# Patient Record
Sex: Male | Born: 1945 | Race: White | Hispanic: No | Marital: Married | Smoking: Never smoker
Health system: Southern US, Community
[De-identification: ages and names within clinical notes are randomized; demographics above are authoritative.]

## PROBLEM LIST (undated history)

## (undated) DIAGNOSIS — R0602 Shortness of breath: Secondary | ICD-10-CM

## (undated) DIAGNOSIS — K635 Polyp of colon: Secondary | ICD-10-CM

## (undated) DIAGNOSIS — G2581 Restless legs syndrome: Secondary | ICD-10-CM

## (undated) DIAGNOSIS — R569 Unspecified convulsions: Secondary | ICD-10-CM

## (undated) HISTORY — DX: Polyp of colon: K63.5

## (undated) HISTORY — DX: Restless legs syndrome: G25.81

## (undated) HISTORY — PX: POLYPECTOMY: SHX149

## (undated) HISTORY — DX: Unspecified convulsions: R56.9

## (undated) HISTORY — PX: HERNIA REPAIR: SHX51

## (undated) HISTORY — DX: Shortness of breath: R06.02

## (undated) HISTORY — PX: COLONOSCOPY: SHX174

---

## 2002-10-15 LAB — HM COLONOSCOPY

## 2002-10-19 ENCOUNTER — Ambulatory Visit (HOSPITAL_COMMUNITY): Admission: RE | Admit: 2002-10-19 | Discharge: 2002-10-19 | Payer: Self-pay | Admitting: Internal Medicine

## 2008-06-21 ENCOUNTER — Ambulatory Visit: Payer: Self-pay | Admitting: Vascular Surgery

## 2009-01-24 ENCOUNTER — Ambulatory Visit (HOSPITAL_COMMUNITY): Admission: RE | Admit: 2009-01-24 | Discharge: 2009-01-24 | Payer: Self-pay | Admitting: Surgery

## 2009-01-24 ENCOUNTER — Encounter: Admission: RE | Admit: 2009-01-24 | Discharge: 2009-01-24 | Payer: Self-pay | Admitting: Surgery

## 2009-01-24 ENCOUNTER — Encounter: Payer: Self-pay | Admitting: Family Medicine

## 2009-01-26 ENCOUNTER — Encounter: Payer: Self-pay | Admitting: Family Medicine

## 2009-01-27 ENCOUNTER — Encounter: Payer: Self-pay | Admitting: Family Medicine

## 2009-02-03 ENCOUNTER — Encounter: Payer: Self-pay | Admitting: Family Medicine

## 2009-02-09 ENCOUNTER — Ambulatory Visit: Payer: Self-pay | Admitting: Family Medicine

## 2009-02-09 DIAGNOSIS — Z87898 Personal history of other specified conditions: Secondary | ICD-10-CM

## 2009-02-09 DIAGNOSIS — R569 Unspecified convulsions: Secondary | ICD-10-CM

## 2009-02-09 DIAGNOSIS — R0602 Shortness of breath: Secondary | ICD-10-CM

## 2009-02-09 HISTORY — DX: Unspecified convulsions: R56.9

## 2009-02-09 HISTORY — DX: Shortness of breath: R06.02

## 2009-02-11 ENCOUNTER — Ambulatory Visit (HOSPITAL_COMMUNITY): Admission: RE | Admit: 2009-02-11 | Discharge: 2009-02-11 | Payer: Self-pay | Admitting: Surgery

## 2009-02-11 HISTORY — PX: HERNIA REPAIR: SHX51

## 2009-02-21 ENCOUNTER — Ambulatory Visit: Payer: Self-pay | Admitting: Family Medicine

## 2009-02-21 LAB — CONVERTED CEMR LAB
ALT: 19 units/L (ref 0–53)
AST: 21 units/L (ref 0–37)
Albumin: 4 g/dL (ref 3.5–5.2)
Alkaline Phosphatase: 58 units/L (ref 39–117)
BUN: 8 mg/dL (ref 6–23)
Basophils Absolute: 0 10*3/uL (ref 0.0–0.1)
Basophils Relative: 0.9 % (ref 0.0–3.0)
Bilirubin Urine: NEGATIVE
Bilirubin, Direct: 0.1 mg/dL (ref 0.0–0.3)
CO2: 34 meq/L — ABNORMAL HIGH (ref 19–32)
Calcium: 9 mg/dL (ref 8.4–10.5)
Chloride: 104 meq/L (ref 96–112)
Cholesterol: 141 mg/dL (ref 0–200)
Creatinine, Ser: 0.7 mg/dL (ref 0.4–1.5)
Eosinophils Absolute: 0.1 10*3/uL (ref 0.0–0.7)
Eosinophils Relative: 1.8 % (ref 0.0–5.0)
GFR calc non Af Amer: 121.44 mL/min (ref >60)
Glucose, Bld: 96 mg/dL (ref 70–99)
HCT: 40.9 % (ref 39.0–52.0)
HDL: 70.9 mg/dL (ref >39.00)
Hemoglobin: 14.3 g/dL (ref 13.0–17.0)
Ketones, urine, test strip: NEGATIVE
LDL Cholesterol: 65 mg/dL (ref 0–99)
Lymphocytes Relative: 29.8 % (ref 12.0–46.0)
Lymphs Abs: 1.2 10*3/uL (ref 0.7–4.0)
MCHC: 34.8 g/dL (ref 30.0–36.0)
MCV: 97.5 fL (ref 78.0–100.0)
Monocytes Absolute: 0.5 10*3/uL (ref 0.1–1.0)
Monocytes Relative: 12.9 % — ABNORMAL HIGH (ref 3.0–12.0)
Neutro Abs: 2.1 10*3/uL (ref 1.4–7.7)
Neutrophils Relative %: 54.6 % (ref 43.0–77.0)
PSA: 0.45 ng/mL (ref 0.10–4.00)
Platelets: 154 10*3/uL (ref 150.0–400.0)
Potassium: 4.8 meq/L (ref 3.5–5.1)
Protein, U semiquant: NEGATIVE
RBC: 4.19 M/uL — ABNORMAL LOW (ref 4.22–5.81)
RDW: 12.6 % (ref 11.5–14.6)
Sodium: 143 meq/L (ref 135–145)
Specific Gravity, Urine: 1.015
TSH: 2.24 microintl units/mL (ref 0.35–5.50)
Total Bilirubin: 0.7 mg/dL (ref 0.3–1.2)
Total CHOL/HDL Ratio: 2
Total Protein: 6.3 g/dL (ref 6.0–8.3)
Triglycerides: 28 mg/dL (ref 0.0–149.0)
VLDL: 5.6 mg/dL (ref 0.0–40.0)
WBC: 3.9 10*3/uL — ABNORMAL LOW (ref 4.5–10.5)

## 2009-02-25 ENCOUNTER — Ambulatory Visit: Payer: Self-pay | Admitting: Family Medicine

## 2009-02-28 DIAGNOSIS — G2581 Restless legs syndrome: Secondary | ICD-10-CM | POA: Insufficient documentation

## 2009-02-28 HISTORY — DX: Restless legs syndrome: G25.81

## 2009-03-07 ENCOUNTER — Ambulatory Visit: Payer: Self-pay | Admitting: Family Medicine

## 2009-03-07 ENCOUNTER — Telehealth: Payer: Self-pay | Admitting: Family Medicine

## 2009-03-07 LAB — CONVERTED CEMR LAB
OCCULT 1: NEGATIVE
OCCULT 2: NEGATIVE
OCCULT 3: NEGATIVE

## 2009-03-10 ENCOUNTER — Telehealth: Payer: Self-pay | Admitting: Family Medicine

## 2009-03-23 ENCOUNTER — Telehealth: Payer: Self-pay | Admitting: Family Medicine

## 2009-08-17 ENCOUNTER — Telehealth: Payer: Self-pay | Admitting: Family Medicine

## 2009-11-16 ENCOUNTER — Ambulatory Visit: Payer: Self-pay | Admitting: Family Medicine

## 2009-12-21 ENCOUNTER — Ambulatory Visit: Payer: Self-pay | Admitting: Family Medicine

## 2009-12-21 DIAGNOSIS — N644 Mastodynia: Secondary | ICD-10-CM

## 2009-12-26 ENCOUNTER — Encounter: Admission: RE | Admit: 2009-12-26 | Discharge: 2009-12-26 | Payer: Self-pay | Admitting: Family Medicine

## 2009-12-26 LAB — HM MAMMOGRAPHY: HM Mammogram: NEGATIVE

## 2010-01-23 ENCOUNTER — Ambulatory Visit: Payer: Self-pay | Admitting: Family Medicine

## 2010-01-23 LAB — CONVERTED CEMR LAB
ALT: 18 units/L (ref 0–53)
Basophils Absolute: 0 10*3/uL (ref 0.0–0.1)
Bilirubin Urine: NEGATIVE
Blood in Urine, dipstick: NEGATIVE
Calcium: 9.5 mg/dL (ref 8.4–10.5)
Cholesterol: 151 mg/dL (ref 0–200)
Creatinine, Ser: 0.6 mg/dL (ref 0.4–1.5)
Eosinophils Absolute: 0 10*3/uL (ref 0.0–0.7)
Eosinophils Relative: 1.6 % (ref 0.0–5.0)
GFR calc non Af Amer: 134.27 mL/min (ref >60)
HDL: 75.2 mg/dL (ref >39.00)
Hemoglobin: 14.5 g/dL (ref 13.0–17.0)
Lymphocytes Relative: 34.7 % (ref 12.0–46.0)
MCHC: 34.5 g/dL (ref 30.0–36.0)
Neutro Abs: 1.5 10*3/uL (ref 1.4–7.7)
Neutrophils Relative %: 48.2 % (ref 43.0–77.0)
Nitrite: NEGATIVE
Platelets: 179 10*3/uL (ref 150.0–400.0)
Potassium: 5 meq/L (ref 3.5–5.1)
RBC: 4.43 M/uL (ref 4.22–5.81)
RDW: 13.8 % (ref 11.5–14.6)
Total Bilirubin: 0.9 mg/dL (ref 0.3–1.2)
Total CHOL/HDL Ratio: 2
Total Protein: 6.5 g/dL (ref 6.0–8.3)
Urobilinogen, UA: 0.2

## 2010-01-31 ENCOUNTER — Ambulatory Visit: Payer: Self-pay | Admitting: Family Medicine

## 2010-02-21 ENCOUNTER — Telehealth: Payer: Self-pay | Admitting: Family Medicine

## 2010-02-23 ENCOUNTER — Telehealth: Payer: Self-pay | Admitting: *Deleted

## 2010-05-05 ENCOUNTER — Ambulatory Visit: Payer: Self-pay | Admitting: Family Medicine

## 2010-08-15 NOTE — Progress Notes (Signed)
Summary: Phenobarbital to [facility]  Phone Note Call from Patient   Caller: Patient Call For: Evelena Peat MD Summary of Call: PB needs to go to [facility], not [facility] per pt. [phone] Initial call taken by: Lynann Beaver CMA,  February 23, 2010 2:59 PM  Follow-up for Phone Call        Rx cancelled from [facility] and faxed to [facility]. Follow-up by: Romualdo Bolk, CMA Duncan Dull),  February 23, 2010 4:54 PM    Prescriptions: PHENOBARBITAL 64.8 MG TABS (PHENOBARBITAL) once daily at bedtime  #90 x 1   Entered by:   Romualdo Bolk, CMA (AAMA)   Authorized by:   Evelena Peat MD   Signed by:   Romualdo Bolk, CMA (AAMA) on 02/23/2010   Method used:   Print then Give to Patient   RxID:   [id number]

## 2010-08-15 NOTE — Progress Notes (Signed)
Summary: [facility] RX request Phenobarbital X 6 months  Phone Note Call from Patient Call back at Home Phone [phone]   Caller: Mom Call For: Evelena Peat MD Summary of Call: pt needs phenobarbital 64.8 mg fax to [facility] 6 month supply pt ID [id number] Initial call taken by: Heron Sabins,  August 17, 2009 12:40 PM  Follow-up for Phone Call        [facility] to refill for 6 months. Follow-up by: Evelena Peat MD,  August 17, 2009 5:53 PM  Additional Follow-up for Phone Call Additional follow up Details #1::        Rx called into [facility] for 6 months, pt informed on home phone provided Additional Follow-up by: Sid Falcon LPN,  August 18, 2009 8:37 AM    Prescriptions: PHENOBARBITAL 64.8 MG TABS (PHENOBARBITAL) once daily at bedtime  #90 x 1   Entered by:   Sid Falcon LPN   Authorized by:   Evelena Peat MD   Signed by:   Sid Falcon LPN on 62/95/2841   Method used:   Telephoned to ...       [facility]* (mail-order)             ,          Ph: [phone]       Fax: [phone]   RxID:   [id number]

## 2010-08-15 NOTE — Letter (Signed)
Summary: Medical History Form  Medical History Form   Imported By: Maryln Gottron 02/01/2010 14:08:04  _____________________________________________________________________  External Attachment:    Type:   Image     Comment:   External Document

## 2010-08-15 NOTE — Assessment & Plan Note (Signed)
Summary: FLU SHOT//CCM  Nurse Visit   Allergies: 1)  Dilantin (Phenytoin Sodium Extended) 2)  Tegretol (Carbamazepine) 3)  Flagyl (Metronidazole) 4)  Penicillin V Potassium (Penicillin V Potassium)  Orders Added: 1)  Admin 1st Vaccine [90471] 2)  Flu Vaccine 9yrs + [16109]         Flu Vaccine Consent Questions     Do you have a history of severe allergic reactions to this vaccine? no    Any prior history of allergic reactions to egg and/or gelatin? no    Do you have a sensitivity to the preservative Thimersol? no    Do you have a past history of Guillan-Barre Syndrome? no    Do you currently have an acute febrile illness? no    Have you ever had a severe reaction to latex? no    Vaccine information given and explained to patient? yes    Are you currently pregnant? no    Lot Number:[id number]   Exp Date:01/13/2011   Site Given  Left Deltoid IM

## 2010-08-15 NOTE — Progress Notes (Signed)
Summary: refill Phenobarbital X 1 year  Phone Note Refill Request Call back at Home Phone [phone] Message from:  Patient--live call  Refills Requested: Medication #1:  PHENOBARBITAL 64.8 MG TABS once daily at bedtime. fax to [facility].......  Initial call taken by: Warnell Forester,  February 21, 2010 10:57 AM    Prescriptions: PHENOBARBITAL 64.8 MG TABS (PHENOBARBITAL) once daily at bedtime  #90 x 3   Entered by:   Sid Falcon LPN   Authorized by:   Evelena Peat MD   Signed by:   Sid Falcon LPN on 09/81/1914   Method used:   [facility] ...       [facility]  [address] [phone][facility] (retail)       [address]       [place], [place]  [address]       Ph: [phone] or [phone]       Fax: [phone]   RxID:   [id number]

## 2010-08-15 NOTE — Assessment & Plan Note (Signed)
Summary: requesting tests//ccm   Vital Signs:  Patient profile:   65 year old male Weight:      151 pounds Temp:     98.5 degrees F oral BP sitting:   110 / 70  (left arm) Cuff size:   regular  Vitals Entered By: Kathrynn Speed CMA (December 21, 2009 3:57 PM) CC: Soreness in Left breast for 6 weeks   History of Present Illness: persistent soreness L breast.  No discharge. Refer to previous note.  He has not noted any masses. No hx of injury.  Pain is mild severity.    No rash, nipple discharge, headache, appetite or weight changes.  Takes Phenobarbital for seizure disorder.  Current Medications (verified): 1)  Phenobarbital 64.8 Mg Tabs (Phenobarbital) .... 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    Physical Exam  General:  Well-developed,well-nourished,in no acute distress; alert,appropriate and cooperative throughout examination Neck:  No deformities, masses, or tenderness noted. Breasts:  L breast slight prominence (gynecomastia) c/w R but no distinct masses.  No nipple discharge or nipple inversion.  Minimaly tender.  No axillary masses. Lungs:  Normal respiratory effort, chest expands symmetrically. Lungs are  clear to auscultation, no crackles or wheezes. Heart:  Normal rate and regular rhythm. S1 and S2 normal without gallop, murmur, click, rub or other extra sounds.   Impression & Recommendations:  Problem # 1:  BREAST PAIN, LEFT (ICD-611.71) Suspect some asymmetric gynecomastia.  No distinct masses palpated.  Can see gynecomastia with phenobarbital.  Will  obtain mammogram.  No evidence for Paget's. Orders: Radiology Referral (Radiology)  Complete Medication List: 1)  Phenobarbital 64.8 Mg Tabs (Phenobarbital) .... Once daily at bedtime  Patient Instructions: 1)  We will call you with appt for mammogram.

## 2010-08-15 NOTE — Assessment & Plan Note (Signed)
Summary: cpx/njr   Vital Signs:  Patient profile:   65 year old male Height:      71.50 inches Weight:      149 pounds BMI:     20.57 Temp:     98 degrees F oral Pulse rate:   60 / minute Pulse rhythm:   regular Resp:     12 per minute BP sitting:   108 / 68  (left arm) Cuff size:   regular  Vitals Entered By: Sid Falcon LPN (January 31, 2010 9:07 AM) CC: CPX, labs done   History of Present Illness: Here for CPE.  Recent breast pain improved.   PMH, SH, AND FH  reviewed.  Clinical Review Panels:  Prevention   Last Mammogram:  BI-RADS CATEGORY 1:  Negative.^MM DIGITAL DIAGNOSTIC UNILAT L (12/26/2009)   Last Colonoscopy:  Done (10/15/2002)   Last PSA:  0.42 (01/23/2010)  Immunizations   Last Tetanus Booster:  Historical (01/28/2007)  Lipid Management   Cholesterol:  151 (01/23/2010)   LDL (bad choesterol):  72 (01/23/2010)   HDL (good cholesterol):  75.20 (01/23/2010)  CBC   WBC:  3.1 (01/23/2010)   RBC:  4.43 (01/23/2010)   Hgb:  14.5 (01/23/2010)   Hct:  42.0 (01/23/2010)   Platelets:  179.0 (01/23/2010)   MCV  94.9 (01/23/2010)   MCHC  34.5 (01/23/2010)   RDW  13.8 (01/23/2010)   PMN:  48.2 (01/23/2010)   Lymphs:  34.7 (01/23/2010)   Monos:  14.4 (01/23/2010)   Eosinophils:  1.6 (01/23/2010)   Basophil:  1.1 (01/23/2010)  Complete Metabolic Panel   Glucose:  100 (01/23/2010)   Sodium:  141 (01/23/2010)   Potassium:  5.0 (01/23/2010)   Chloride:  104 (01/23/2010)   CO2:  36 (01/23/2010)   BUN:  12 (01/23/2010)   Creatinine:  0.6 (01/23/2010)   Albumin:  4.5 (01/23/2010)   Total Protein:  6.5 (01/23/2010)   Calcium:  9.5 (01/23/2010)   Total Bili:  0.9 (01/23/2010)   Alk Phos:  67 (01/23/2010)   SGPT (ALT):  18 (01/23/2010)   SGOT (AST):  23 (01/23/2010)   Allergies: 1)  Dilantin (Phenytoin Sodium Extended) 2)  Tegretol (Carbamazepine) 3)  Flagyl (Metronidazole) 4)  Penicillin V Potassium (Penicillin V Potassium)  Past History:  Past  Medical History: Last updated: 02/09/2009 Seizure disorder  Past Surgical History: Last updated: 02/25/2009 Inguinal herniorrhaphy  Family History: Last updated: 01/31/2010 Family History of Multiple Myeloma, father Family History Ovarian cancer, mother Family History of Stroke grandparent  Social History: Last updated: 01/31/2010 Occupation:  Engineer, 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      Physical Exam  General:  Well-developed,well-nourished,in no acute distress; alert,appropriate and cooperative throughout examination Head:  Normocephalic and atraumatic without obvious abnormalities. No apparent alopecia or balding. Eyes:  No  corneal or conjunctival inflammation noted. EOMI. Perrla. Funduscopic exam benign, without hemorrhages, exudates or papilledema. Vision grossly normal. Ears:  External ear exam shows no significant lesions or deformities.  Otoscopic examination reveals clear canals, tympanic membranes are intact bilaterally  without bulging, retraction, inflammation or discharge. Hearing is grossly normal bilaterally. Mouth:  Oral mucosa and oropharynx without lesions or exudates.  Teeth in good repair. Neck:  No deformities, masses, or tenderness noted. Breasts:  mild gynecomastia.  No masses. Lungs:  Normal respiratory effort, chest expands symmetrically. Lungs are clear to auscultation, no crackles or wheezes. Heart:  normal rate, regular rhythm, and no murmur.   Abdomen:  Bowel sounds positive,abdomen soft and non-tender without masses, organomegaly or hernias noted. Rectal:  No external abnormalities noted. Normal sphincter tone. No rectal masses or tenderness. Prostate:  Prostate gland firm and smooth, no enlargement, nodularity, tenderness, mass, asymmetry or induration. Extremities:  No clubbing, cyanosis, edema, or deformity noted with normal full range of motion of all joints.   Neurologic:  No cranial nerve deficits noted. Station and gait are normal. Plantar reflexes are down-going bilaterally. DTRs are symmetrical throughout. Sensory, motor and coordinative functions appear intact. Skin:  no rashes and no suspicious lesions.   Cervical Nodes:  No lymphadenopathy noted Psych:  normally interactive, good eye contact, not anxious appearing, and not depressed appearing.     Impression & Recommendations:  Problem # 1:  ROUTINE GENERAL MEDICAL EXAM@HEALTH  CARE FACL (ICD-V70.0) labs reviewed with pt.  Colonoscopy up to date.  No hx of Zostavax and pt not interested.  Complete Medication List: 1)  Phenobarbital 64.8 Mg Tabs (Phenobarbital) .... Once daily at bedtime  Patient Instructions: 1)  It is important that you exercise reguarly at least 20 minutes 5 times a week. If you develop chest pain, have severe difficulty breathing, or feel very tired, stop exercising immediately and seek medical attention.  2)  Please schedule a follow-up appointment in 1 year.

## 2010-08-15 NOTE — Assessment & Plan Note (Signed)
Summary: pain in left breast x 2 weeks /ccm   Vital Signs:  Patient profile:   65 year old male Weight:      154 pounds Temp:     98.2 degrees F oral BP sitting:   110 / 68  (left arm) Cuff size:   regular  Vitals Entered By: Sid Falcon LPN (Nov 17, 2839 3:56 PM) CC: pain lef breast area X 2 weeks,   History of Present Illness: Left breast pain past 2 weeks. No visible swelling and no masses palpated. No rashes and no discharge. Denies fever or chills. No injury. No adenopathy noted. No appetite or weight changes.  Right inguinal hernia surgery last year. Has some occasional fleeting pains radiating toward the scrotum. Has not noted any masses or evidence for recurrence.  No dysuria.  Milld intensity of pain.  No exacerbating or alleviating factors.  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    Physical Exam  General:  Well-developed,well-nourished,in no acute distress; alert,appropriate and cooperative throughout examination Ears:  L ear normal.   Neck:  No  deformities, masses, or tenderness noted. Chest Wall:  nontender to palpation Breasts:  right breast is slightly larger than left which he states has been present for several years. No breast masses palpated. No axillary adenopathy. No nipple discharge. No areolar rashes. No evidence for cellulitis. Lungs:  Normal respiratory effort, chest expands symmetrically. Lungs are clear to auscultation, no crackles or wheezes. Heart:  Normal rate and regular rhythm. S1 and S2 normal without gallop, murmur, click, rub or other extra sounds. Genitalia:  circumcised and no testicular masses or atrophy.   No recurrent hernia. Skin:  no rash Cervical Nodes:  No lymphadenopathy noted Axillary Nodes:  No palpable lymphadenopathy   Impression & Recommendations:  Problem # 1:  BREAST PAIN, LEFT (ICD-611.71) Assessment New no masses palpated. No evidence for cellulitis.  Observe.  Touch base 2 weeks if no better.  Problem # 2:  INGUINAL PAIN, RIGHT (ICD-789.09) ? sec to scar tissue.  No recurrent hernia.  Complete Medication List: 1)  Phenobarbital 64.8 Mg Tabs (Phenobarbital) .... Once daily at bedtime  Patient Instructions: 1)  follow up promptly if he notices any masses, nipple discharge, redness involving the skin, or any fever

## 2010-08-21 ENCOUNTER — Other Ambulatory Visit: Payer: Self-pay | Admitting: Family Medicine

## 2010-08-21 DIAGNOSIS — R569 Unspecified convulsions: Secondary | ICD-10-CM

## 2010-08-21 MED ORDER — PHENOBARBITAL 60 MG PO TABS: 60.0000 mg | ORAL_TABLET | Freq: Every day | ORAL | Status: DC

## 2010-08-21 NOTE — Telephone Encounter (Signed)
Refill request for Phenobarbital 60mg 

## 2010-08-21 NOTE — Telephone Encounter (Signed)
Pt requesting rx refill of Phenobarbitol 60mg   90 day supply from [facility] [phone]

## 2010-08-21 NOTE — Telephone Encounter (Signed)
[facility] to refill for 6 months 

## 2010-08-22 NOTE — Telephone Encounter (Signed)
Rx faxed to [facility], confirmation received

## 2010-10-22 LAB — BASIC METABOLIC PANEL
CO2: 30 mEq/L (ref 19–32)
Chloride: 103 mEq/L (ref 96–112)
GFR calc Af Amer: >60 mL/min (ref >60)
Glucose, Bld: 103 mg/dL — ABNORMAL HIGH (ref 70–99)
Potassium: 4.4 mEq/L (ref 3.5–5.1)

## 2010-10-22 LAB — CBC
HCT: 40.5 % (ref 39.0–52.0)
HCT: 41.2 % (ref 39.0–52.0)
Hemoglobin: 13.9 g/dL (ref 13.0–17.0)
MCHC: 33.8 g/dL (ref 30.0–36.0)
MCV: 95.5 fL (ref 78.0–100.0)
RDW: 13.4 % (ref 11.5–15.5)
RDW: 13.6 % (ref 11.5–15.5)
WBC: 3.7 10*3/uL — ABNORMAL LOW (ref 4.0–10.5)

## 2010-10-22 LAB — DIFFERENTIAL
Basophils Absolute: 0 10*3/uL (ref 0.0–0.1)
Basophils Absolute: 0 10*3/uL (ref 0.0–0.1)
Basophils Relative: 1 % (ref 0–1)
Basophils Relative: 1 % (ref 0–1)
Eosinophils Absolute: 0.1 10*3/uL (ref 0.0–0.7)
Eosinophils Relative: 2 % (ref 0–5)
Lymphocytes Relative: 36 % (ref 12–46)
Lymphs Abs: 1.3 10*3/uL (ref 0.7–4.0)
Monocytes Absolute: 0.5 10*3/uL (ref 0.1–1.0)
Monocytes Absolute: 0.5 10*3/uL (ref 0.1–1.0)
Monocytes Relative: 15 % — ABNORMAL HIGH (ref 3–12)
Neutro Abs: 1.7 10*3/uL (ref 1.7–7.7)
Neutrophils Relative %: 46 % (ref 43–77)

## 2010-10-22 LAB — COMPREHENSIVE METABOLIC PANEL
Chloride: 102 mEq/L (ref 96–112)
Potassium: 4.4 mEq/L (ref 3.5–5.1)
Sodium: 139 mEq/L (ref 135–145)
Total Protein: 6.1 g/dL (ref 6.0–8.3)

## 2010-11-28 NOTE — Consult Note (Signed)
VASCULAR SURGERY CONSULTATION   Nathan [name], Nathan [name]  DOB:  05/19/1946                                       06/21/2008  ZOXWR#:[id number]   The patient is a 65 year old male patient referred for vascular surgery  consultation by [facility] for venous insufficiency of both  lower extremities.  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  He describes this as a burning  stinging discomfort and has recently noticed a scaly rash in the lateral  aspect of his left ankle, which has been prominent for several months,  but has not become ulcerated.  He has no history of deep vein  thrombosis, thrombophlebitis, but has had increasing swelling in the  ankle and feet as the day progresses.  He has been wearing short leg  compression stockings (8 mm - 15 mm) for the last few months, but has  not worn long leg stockings.  He is unable to elevate his legs  frequently because of his occupation and has not taken pain medicine on  a regular basis.  He has no history of bleeding.   PAST MEDICAL HISTORY:  1. Epilepsy.  2. Negative for diabetes, hypertension, hyperlipidemia, coronary      artery disease, COPD, or stroke.   SURGERY:  Previously on his left foot.   FAMILY HISTORY:  Positive for diabetes in his father.  Negative for  coronary artery disease or stroke.   SOCIAL HISTORY:  He is married.  He works as a Editor, commissioning in [employer] and does not use tobacco or alcohol.   REVIEW OF SYSTEMS:  Unremarkable, with the exception of his chronic  seizure disorder, controlled by phenobarbital on a daily basis.   ALLERGIES:  Dilantin.   PHYSICAL EXAM:  Blood pressure is 112/78, heart rate is 70, respirations  14.  Generally, he is a healthy-appearing middle-aged male in no  apparent distress.  Alert and oriented x3.  Neck is supple.  3+ carotid  pulses  palpable.  No bruits are audible.  Neurologic exam is normal.  No  palpable adenopathy in the neck.  Chest is clear to auscultation.  Cardiovascular exam is regular rhythm with no murmurs.  Abdomen is soft  and nontender with no masses.  He has 3+ femoral, popliteal, dorsalis  pedis, and posterior tibial pulses bilaterally.  Both lower extremities  have severe venous insufficiency.  Left leg has bulging varicosities in  the medial calf over the great saphenous system and also laterally  extending down into the lateral ankle, where he has prominent reticular  and spider veins.  There is a patch of eczematous skin change, which is  very scaly in nature, measuring about 3.5 cm in diameter, which is not  currently ulcerated.  There is mild lymphedema distally in the left  ankle.  The right leg has bulging varicosities in the medial thigh and  calf over the great saphenous system extending posteriorly over the  small saphenous system.  He has mild edema distally on the right with no  hyperpigmentation or ulceration.   Venous duplex exam today was performed in the office, revealing reflux  bilaterally in the great saphenous veins.  On the left side, it extends  up to the saphenofemoral junction  down to the knee.  The right leg also  has reflux in the small saphenous vein as well as the great saphenous  vein.   I feel that he does have symptomatic venous insufficiency of both lower  extremities with skin changes on the left side.  These will be treated  with long-leg elastic compression stockings (20-30 mm) as well as  elevation of the legs as much as possible and analgesics (ibuprofen) on  a daily basis.  He will return in 3 months for further evaluation.  If  there has been no change, then I feel that he would be a good candidate  for staged bilateral laser ablations of the great saphenous veins with  multiple stab phlebectomies, to be followed by laser ablation of the  right small saphenous  vein.   Quita Skye Hart [place], M.D.  Electronically Signed  JDL/MEDQ  D:  06/21/2008  T:  06/22/2008  Job:  [id number]   cc:   Attn:  Sherre Lain, NP [facility]

## 2010-11-28 NOTE — Op Note (Signed)
NAMESHEIKH, LEVERICH                 ACCOUNT NO.:  [id number]   MEDICAL RECORD NO.:  [id number]          PATIENT TYPE:  AMB   LOCATION:  SDS                          FACILITY:  [facility]   PHYSICIAN:  Thomas A. Cornett, M.D.DATE OF BIRTH:  1945/09/13   DATE OF PROCEDURE:  02/11/2009  DATE OF DISCHARGE:  02/11/2009                               OPERATIVE REPORT   PREOPERATIVE DIAGNOSIS:  Right inguinal hernia.   POSTOPERATIVE DIAGNOSIS:  Right inguinal hernia.   PROCEDURE:  Repair of right inguinal hernia with mesh.   SURGEON:  Maisie Fus A. Cornett, MD   ANESTHESIA:  General endotracheal anesthesia, 0.25% Sensorcaine local.   ESTIMATED BLOOD LOSS:  10 mL.   SPECIMEN:  None.   INDICATIONS FOR PROCEDURE:  The patient is a 65 year old male with a  right inguinal hernia.  It has become symptomatic and he wishes to have  it repaired.  Risk of bleeding, infection, chronic pain, nerve injury,  numbness, tingling, and recurrence were all discussed preoperatively  with him.  Also discussion of vascular injury and organ injury.  He  agreed to proceed.   DESCRIPTION OF PROCEDURE:  The patient was brought to the operating  room, placed supine.  After induction of general anesthesia, the right  inguinal region was prepped and draped in the sterile fashion.  An  oblique incision was made in the right inguinal crease.  Dissection was  carried down through the Scarpa fascia.  Once this was done, the  aponeurosis of the external oblique was separated and the cord  structures were identified.  These were encircled with a 1/4-inch  Penrose drain.  There was a small indirect hernia noted and this was  reduced easily.  There was a direct hernia as well.  A large Prolene  hernia system mesh brought into the field and placed in the  preperitoneal space with inner leaflet being deployed and swept around.  The onlay was placed on the floor of the inguinal canal and sutured to  the shelving edge of the  inguinal ligament down to the pubic tubercle,  Cooper's ligament, and then to the conjoined tendon medially with 0  Novofils.  Small slit was cut for the cord structures, and they were  allowed to escape at the center portion of mesh with adequate room to  the tip of my fifth digit to fit easily.  Mesh was closed around this  with a Novofil suture.  The ilioinguinal nerve was preserved since I was  able to put in position where it would not be irritated with mesh.  Once  the mesh is in place, I closed the aponeurosis of the external oblique  with 2-  0 Vicryl and then 3-0 Vicryl was used to approximate the Scarpa fascia  and 4-0 Monocryl was used to close the skin.  Dermabond was applied.  All final counts of sponge, needle, and instruments were found to be  correct for this portion of the case.  The patient was then awoke, taken  to the recovery room in satisfactory condition.      Thomas A.  Cornett, M.D.  Electronically Signed     TAC/MEDQ  D:  02/11/2009  T:  02/12/2009  Job:  [id number]

## 2010-11-28 NOTE — Procedures (Signed)
LOWER EXTREMITY VENOUS REFLUX EXAM   INDICATION:  Bilateral leg pain and swelling.   EXAM:  Using color-flow imaging and pulse Doppler spectral analysis, the  right and left common femoral, superficial femoral, popliteal, posterior  tibial, greater and lesser saphenous veins are evaluated.  There is no  evidence suggesting deep venous insufficiency in the right and left  lower extremity.   The left saphenofemoral junction is not competent.  The right and left  GSV is not competent with the caliber as described below.   The right proximal short saphenous vein demonstrates incompetency.   GSV Diameter (used if found to be incompetent only)                                            Right    Left  Proximal Greater Saphenous Vein           0.44 cm  0.47 cm  Proximal-to-mid-thigh                     0.44 cm  0.47 cm  Mid thigh                                 0.35 cm  0.41 cm  Mid-distal thigh                          0.35 cm  0.41 cm  Distal thigh                              0.35 cm  0.40 cm  Knee                                      0.41 cm  0.58 cm   IMPRESSION:  1. Right and left greater saphenous vein reflux is identified with the      caliber ranging from on the right 0.41 cm to 0.44 cm knee to the      proximal greater saphenous vein and on the left is 0.58 to 0.68,      knee to groin.  2. The right and left greater saphenous vein is not aneurysmal.  3. The right and left greater saphenous vein is not tortuous.  4. The deep venous system is competent.  5. The right lesser saphenous vein is not competent.  6. No evidence of deep venous thrombosis noted in bilateral legs.   ___________________________________________  Quita Skye Hart [place], M.D.   MG/MEDQ  D:  06/21/2008  T:  06/22/2008  Job:  [id number]

## 2010-12-01 NOTE — Op Note (Signed)
NAME:  Nathan [name], Nathan [name]                           ACCOUNT NO.:  [id number]   MEDICAL RECORD NO.:  [id number]                   PATIENT TYPE:  AMB   LOCATION:  DAY                                  FACILITY:  [facility]   PHYSICIAN:  R. Roetta Sessions, M.D.              DATE OF BIRTH:  24-Dec-1945   DATE OF PROCEDURE:  10/19/2002  DATE OF DISCHARGE:                                 OPERATIVE REPORT   PROCEDURE:  Screening colonoscopy.   INDICATIONS FOR PROCEDURE:  The patient is a 65 year old gentleman referred  by courtesy of Dr. Elvina Sidle for colorectal cancer screening.  He is  devoid of any lower GI tract symptoms aside from two to three bouts of  abdominal pain a year which he has had for a good 20 years.  No blood per  rectum, etc.  No family history of colorectal neoplasia.  He has never had  his lower GI tract imaged.  Colonoscopy is now being done to further  evaluate his symptoms.  This approach has been discussed with the patient at  length.  The potential risks, benefits, and alternatives have been reviewed.  Please see documentation in the medical record.   PROCEDURE:  O2 saturation, blood pressure, pulses, and respirations were  monitored throughout the entire procedure.  Conscious sedation was with  Versed 3 mg IV, Demerol 50 mg IV in divided doses.  The instrument used was  the Olympus video chip adult colonoscope.   FINDINGS:  Digital rectal examination revealed no abnormalities.   ENDOSCOPIC FINDINGS:  The prep was good.   Rectum:  Examination of the rectal mucosa including retroflex view of the  anal verge revealed only internal hemorrhoids.   Colon:  The colonic mucosa was surveyed from the rectosigmoid junction  through the left, transverse, right colon to the area of the appendiceal  orifice, ileocecal valve, and cecum.  These structures were well-seen and  photographed for the record.  The patient had numerous scattered small-mouth  left-sided diverticula.  The  remainder of the colonic mucosa to the cecum  appeared normal.  From the level of the cecum and ileocecal valve, the scope  was slowly withdrawn.  All previously mentioned mucosal surfaces were again  seen.  No other abnormalities were observed.  The patient tolerated the  procedure well and was reactive in endoscopy.   IMPRESSION:  1. Internal hemorrhoids, otherwise normal rectum.  2. Left-sided diverticula.  The remainder of the colonic mucosa appeared     normal.  3. I wonder if the patient is not having mild bouts of diverticulitis on     occasion.  At any rate, at this time he is asymptomatic.    RECOMMENDATIONS:  1. Diverticulosis literature provided to the patient.  2. Increased fiber in diet.  3. Repeat colonoscopy in 10 years.  Jonathon Bellows, M.D.    RMR/MEDQ  D:  10/19/2002  T:  10/19/2002  Job:  [id number]   cc:   Elvina Sidle, M.D.  [address] [place]  [place] [address]  Fax: [phone]

## 2011-01-18 ENCOUNTER — Other Ambulatory Visit: Payer: Self-pay

## 2011-01-22 ENCOUNTER — Other Ambulatory Visit (INDEPENDENT_AMBULATORY_CARE_PROVIDER_SITE_OTHER): Payer: Self-pay

## 2011-01-22 DIAGNOSIS — Z0389 Encounter for observation for other suspected diseases and conditions ruled out: Secondary | ICD-10-CM

## 2011-01-22 DIAGNOSIS — Z0000 Encounter for general adult medical examination without abnormal findings: Secondary | ICD-10-CM

## 2011-01-22 LAB — HEPATIC FUNCTION PANEL
AST: 22 U/L (ref 0–37)
Alkaline Phosphatase: 59 U/L (ref 39–117)
Bilirubin, Direct: 0.2 mg/dL (ref 0.0–0.3)
Total Bilirubin: 0.7 mg/dL (ref 0.3–1.2)

## 2011-01-22 LAB — URINALYSIS
Hgb urine dipstick: NEGATIVE
Specific Gravity, Urine: 1.01 (ref 1.000–1.030)
Total Protein, Urine: NEGATIVE
Urobilinogen, UA: 0.2 (ref 0.0–1.0)
pH: 7 (ref 5.0–8.0)

## 2011-01-22 LAB — BASIC METABOLIC PANEL
BUN: 12 mg/dL (ref 6–23)
CO2: 31 mEq/L (ref 19–32)
Chloride: 100 mEq/L (ref 96–112)
Creatinine, Ser: 0.6 mg/dL (ref 0.4–1.5)
Glucose, Bld: 89 mg/dL (ref 70–99)
Potassium: 4.3 mEq/L (ref 3.5–5.1)
Sodium: 141 mEq/L (ref 135–145)

## 2011-01-22 LAB — CBC WITH DIFFERENTIAL/PLATELET
Eosinophils Absolute: 0.1 10*3/uL (ref 0.0–0.7)
Eosinophils Relative: 1.9 % (ref 0.0–5.0)
Hemoglobin: 14.1 g/dL (ref 13.0–17.0)
Lymphocytes Relative: 40.5 % (ref 12.0–46.0)
MCHC: 34.6 g/dL (ref 30.0–36.0)
Monocytes Absolute: 0.4 10*3/uL (ref 0.1–1.0)
Neutro Abs: 1.1 10*3/uL — ABNORMAL LOW (ref 1.4–7.7)
RDW: 13.4 % (ref 11.5–14.6)
WBC: 2.7 10*3/uL — ABNORMAL LOW (ref 4.5–10.5)

## 2011-01-22 LAB — TSH: TSH: 1.57 u[IU]/mL (ref 0.35–5.50)

## 2011-01-22 LAB — LIPID PANEL
HDL: 78.4 mg/dL (ref >39.00)
LDL Cholesterol: 70 mg/dL (ref 0–99)
Total CHOL/HDL Ratio: 2
VLDL: 2.2 mg/dL (ref 0.0–40.0)

## 2011-01-25 ENCOUNTER — Encounter: Payer: Self-pay | Admitting: Family Medicine

## 2011-02-01 ENCOUNTER — Encounter: Payer: Self-pay | Admitting: Family Medicine

## 2011-02-02 ENCOUNTER — Encounter: Payer: Self-pay | Admitting: Family Medicine

## 2011-02-05 ENCOUNTER — Encounter: Payer: Self-pay | Admitting: Family Medicine

## 2011-02-05 ENCOUNTER — Ambulatory Visit (INDEPENDENT_AMBULATORY_CARE_PROVIDER_SITE_OTHER): Payer: BC Managed Care – PPO | Admitting: Family Medicine

## 2011-02-05 VITALS — BP 120/72 | HR 60 | Temp 97.0°F | Resp 12 | Ht 71.0 in | Wt 151.0 lb

## 2011-02-05 DIAGNOSIS — Z0000 Encounter for general adult medical examination without abnormal findings: Secondary | ICD-10-CM

## 2011-02-05 DIAGNOSIS — Z2911 Encounter for prophylactic immunotherapy for respiratory syncytial virus (RSV): Secondary | ICD-10-CM

## 2011-02-05 DIAGNOSIS — R569 Unspecified convulsions: Secondary | ICD-10-CM

## 2011-02-05 MED ORDER — PHENOBARBITAL 60 MG PO TABS: 60.0000 mg | ORAL_TABLET | Freq: Every day | ORAL | Status: DC

## 2011-02-05 NOTE — Patient Instructions (Signed)
Check on coverage for shingles vaccine 

## 2011-02-05 NOTE — Progress Notes (Signed)
  Subjective:    Patient ID: Nathan [name], male    DOB: Aug 12, 1945, 65 y.o.   MRN: [id number]  HPI Patient here for complete physical examination. He has history of restless leg syndrome and seizure disorder but no seizure 9 years now. Takes phenobarbital 60 mg at night. He's generally been very healthy. He has multiple drug allergies as previously listed.  Previous colonoscopy 2004. Tetanus up-to-date.  Yearly flu vaccine. No history of shingles vaccine. Exercises regularly. No hx of smoking.  Past Medical History  Diagnosis Date  . DYSPNEA 02/09/2009  . RESTLESS LEGS SYNDROME 02/28/2009  . SEIZURE DISORDER 02/09/2009   Past Surgical History  Procedure Date  . Abdominal hysterectomy     ingunial    reports that he has never smoked. He does not have any smokeless tobacco history on file. He reports that he does not drink alcohol or use illicit drugs. family history includes Cancer in his father and mother.  There is no history of Stroke. Allergies  Allergen Reactions  . Carbamazepine     REACTION: fatigue, sleepy  . Metronidazole     REACTION: GI upset  . Penicillins     REACTION: childhood, hives?  . Phenytoin     REACTION: rash, leg swelling      Review of Systems  Constitutional: Negative for fever, activity change, appetite change and fatigue.  HENT: Negative for ear pain, congestion and trouble swallowing.   Eyes: Negative for pain and visual disturbance.  Respiratory: Negative for cough, shortness of breath and wheezing.   Cardiovascular: Negative for chest pain and palpitations.  Gastrointestinal: Negative for nausea, vomiting, abdominal pain, diarrhea, constipation, blood in stool, abdominal distention and rectal pain.  Genitourinary: Negative for dysuria, hematuria and testicular pain.  Musculoskeletal: Negative for joint swelling and arthralgias.  Skin: Negative for rash.  Neurological: Negative for dizziness, syncope and headaches.  Hematological: Negative for  adenopathy.  Psychiatric/Behavioral: Negative for confusion and dysphoric mood.       Objective:   Physical Exam  Constitutional: He is oriented to person, place, and time. He appears well-developed and well-nourished. No distress.  HENT:  Head: Normocephalic and atraumatic.  Right Ear: External ear normal.  Left Ear: External ear normal.  Mouth/Throat: Oropharynx is clear and moist.  Eyes: Conjunctivae and EOM are normal. Pupils are equal, round, and reactive to light.  Neck: Normal range of motion. Neck supple. No thyromegaly present.  Cardiovascular: Normal rate, regular rhythm and normal heart sounds.   No murmur heard. Pulmonary/Chest: No respiratory distress. He has no wheezes. He has no rales.  Abdominal: Soft. Bowel sounds are normal. He exhibits no distension and no mass. There is no tenderness. There is no rebound and no guarding.  Genitourinary: Rectum normal and prostate normal.  Musculoskeletal: He exhibits no edema.  Lymphadenopathy:    He has no cervical adenopathy.  Neurological: He is alert and oriented to person, place, and time. He displays normal reflexes. No cranial nerve deficit.  Skin: No rash noted.  Psychiatric: He has a normal mood and affect.          Assessment & Plan:  Complete physical.  Labs reviewed with pt and all stable.  Mild leukopenia stable.  Colonoscopy in 2 years.  Hemoccults given.  Consider shingles vaccine and he will proceed if covered by insurance.

## 2011-02-09 ENCOUNTER — Other Ambulatory Visit: Payer: BC Managed Care – PPO

## 2011-02-09 DIAGNOSIS — IMO0001 Reserved for inherently not codable concepts without codable children: Secondary | ICD-10-CM

## 2011-02-09 DIAGNOSIS — K921 Melena: Secondary | ICD-10-CM

## 2011-02-09 LAB — HEMOCCULT GUIAC POC 1CARD (OFFICE): Fecal Occult Blood, POC: NEGATIVE

## 2011-02-09 NOTE — Progress Notes (Signed)
Quick Note:  Pt informed ______ 

## 2011-04-26 ENCOUNTER — Ambulatory Visit (INDEPENDENT_AMBULATORY_CARE_PROVIDER_SITE_OTHER): Payer: BC Managed Care – PPO | Admitting: Family Medicine

## 2011-04-26 DIAGNOSIS — Z23 Encounter for immunization: Secondary | ICD-10-CM

## 2011-05-31 ENCOUNTER — Emergency Department (HOSPITAL_COMMUNITY): Payer: BC Managed Care – PPO

## 2011-05-31 ENCOUNTER — Encounter (HOSPITAL_COMMUNITY): Payer: Self-pay

## 2011-05-31 ENCOUNTER — Inpatient Hospital Stay (HOSPITAL_COMMUNITY)
Admission: EM | Admit: 2011-05-31 | Discharge: 2011-06-18 | DRG: 883 | Disposition: A | Discharge status: Home / Self Care | Payer: BC Managed Care – PPO | Attending: General Surgery | Admitting: General Surgery

## 2011-05-31 ENCOUNTER — Ambulatory Visit (INDEPENDENT_AMBULATORY_CARE_PROVIDER_SITE_OTHER): Payer: BC Managed Care – PPO | Admitting: Internal Medicine

## 2011-05-31 ENCOUNTER — Encounter: Payer: Self-pay | Admitting: Internal Medicine

## 2011-05-31 VITALS — BP 100/60 | Temp 99.1°F | Wt 152.0 lb

## 2011-05-31 DIAGNOSIS — K37 Unspecified appendicitis: Secondary | ICD-10-CM

## 2011-05-31 DIAGNOSIS — G2581 Restless legs syndrome: Secondary | ICD-10-CM

## 2011-05-31 DIAGNOSIS — R188 Other ascites: Secondary | ICD-10-CM

## 2011-05-31 DIAGNOSIS — R110 Nausea: Secondary | ICD-10-CM | POA: Diagnosis not present

## 2011-05-31 DIAGNOSIS — Y836 Removal of other organ (partial) (total) as the cause of abnormal reaction of the patient, or of later complication, without mention of misadventure at the time of the procedure: Secondary | ICD-10-CM | POA: Diagnosis not present

## 2011-05-31 DIAGNOSIS — J9819 Other pulmonary collapse: Secondary | ICD-10-CM | POA: Diagnosis not present

## 2011-05-31 DIAGNOSIS — K3533 Acute appendicitis with perforation and localized peritonitis, with abscess: Principal | ICD-10-CM | POA: Diagnosis present

## 2011-05-31 DIAGNOSIS — E871 Hypo-osmolality and hyponatremia: Secondary | ICD-10-CM | POA: Diagnosis not present

## 2011-05-31 DIAGNOSIS — G40909 Epilepsy, unspecified, not intractable, without status epilepticus: Secondary | ICD-10-CM | POA: Diagnosis present

## 2011-05-31 DIAGNOSIS — K929 Disease of digestive system, unspecified: Secondary | ICD-10-CM | POA: Diagnosis not present

## 2011-05-31 DIAGNOSIS — R0609 Other forms of dyspnea: Secondary | ICD-10-CM | POA: Diagnosis not present

## 2011-05-31 DIAGNOSIS — R0989 Other specified symptoms and signs involving the circulatory and respiratory systems: Secondary | ICD-10-CM | POA: Diagnosis not present

## 2011-05-31 DIAGNOSIS — K560 Paralytic ileus: Secondary | ICD-10-CM | POA: Diagnosis not present

## 2011-05-31 DIAGNOSIS — R197 Diarrhea, unspecified: Secondary | ICD-10-CM | POA: Diagnosis not present

## 2011-05-31 DIAGNOSIS — E8770 Fluid overload, unspecified: Secondary | ICD-10-CM | POA: Diagnosis not present

## 2011-05-31 DIAGNOSIS — R0602 Shortness of breath: Secondary | ICD-10-CM | POA: Insufficient documentation

## 2011-05-31 DIAGNOSIS — K567 Ileus, unspecified: Secondary | ICD-10-CM | POA: Diagnosis not present

## 2011-05-31 DIAGNOSIS — R569 Unspecified convulsions: Secondary | ICD-10-CM

## 2011-05-31 DIAGNOSIS — E46 Unspecified protein-calorie malnutrition: Secondary | ICD-10-CM | POA: Diagnosis not present

## 2011-05-31 DIAGNOSIS — E8779 Other fluid overload: Secondary | ICD-10-CM | POA: Diagnosis not present

## 2011-05-31 DIAGNOSIS — R1031 Right lower quadrant pain: Secondary | ICD-10-CM

## 2011-05-31 DIAGNOSIS — D72829 Elevated white blood cell count, unspecified: Secondary | ICD-10-CM

## 2011-05-31 DIAGNOSIS — Z87898 Personal history of other specified conditions: Secondary | ICD-10-CM | POA: Insufficient documentation

## 2011-05-31 LAB — BASIC METABOLIC PANEL
CO2: 29 mEq/L (ref 19–32)
Calcium: 9.1 mg/dL (ref 8.4–10.5)
Chloride: 98 mEq/L (ref 96–112)
Creatinine, Ser: 0.72 mg/dL (ref 0.50–1.35)
Glucose, Bld: 125 mg/dL — ABNORMAL HIGH (ref 70–99)
Sodium: 136 mEq/L (ref 135–145)

## 2011-05-31 LAB — DIFFERENTIAL
Basophils Absolute: 0 10*3/uL (ref 0.0–0.1)
Eosinophils Relative: 0 % (ref 0–5)
Lymphocytes Relative: 5 % — ABNORMAL LOW (ref 12–46)
Lymphs Abs: 0.8 10*3/uL (ref 0.7–4.0)
Monocytes Absolute: 1.7 10*3/uL — ABNORMAL HIGH (ref 0.1–1.0)
Neutro Abs: 14.1 10*3/uL — ABNORMAL HIGH (ref 1.7–7.7)

## 2011-05-31 LAB — CBC
HCT: 40.1 % (ref 39.0–52.0)
MCV: 93 fL (ref 78.0–100.0)
RBC: 4.31 MIL/uL (ref 4.22–5.81)
WBC: 16.6 10*3/uL — ABNORMAL HIGH (ref 4.0–10.5)

## 2011-05-31 LAB — URINALYSIS, ROUTINE W REFLEX MICROSCOPIC
Glucose, UA: NEGATIVE mg/dL
Hgb urine dipstick: NEGATIVE
Leukocytes, UA: NEGATIVE
Protein, ur: NEGATIVE mg/dL
pH: 5.5 (ref 5.0–8.0)

## 2011-05-31 MED ORDER — KCL IN DEXTROSE-NACL 20-5-0.45 MEQ/L-%-% IV SOLN
Freq: Once | INTRAVENOUS | Status: AC
  Administered 2011-05-31: 22:00:00 via INTRAVENOUS
  Filled 2011-05-31: qty 1000

## 2011-05-31 MED ORDER — SODIUM CHLORIDE 0.9 % IV SOLN
INTRAVENOUS | Status: DC
  Administered 2011-05-31: 1000 mL via INTRAVENOUS

## 2011-05-31 MED ORDER — TIGECYCLINE 50 MG IV SOLR
100.0000 mg | Freq: Once | INTRAVENOUS | Status: DC
  Administered 2011-05-31: 100 mg via INTRAVENOUS
  Filled 2011-05-31: qty 100

## 2011-05-31 MED ORDER — IOHEXOL 300 MG/ML  SOLN
100.0000 mL | Freq: Once | INTRAMUSCULAR | Status: AC | PRN
  Administered 2011-05-31: 100 mL via INTRAVENOUS

## 2011-05-31 MED ORDER — TIGECYCLINE 50 MG IV SOLR
50.0000 mg | Freq: Two times a day (BID) | INTRAVENOUS | Status: DC
  Administered 2011-06-01 – 2011-06-13 (×25): 50 mg via INTRAVENOUS
  Filled 2011-05-31 (×34): qty 100

## 2011-05-31 NOTE — ED Provider Notes (Signed)
History     CSN: [id number] Arrival date & time: 05/31/2011  5:55 PM   First MD Initiated Contact with Patient 05/31/11 1816      Chief Complaint  Patient presents with  . Abdominal Pain    (Consider location/radiation/quality/duration/timing/severity/associated sxs/prior treatment) Patient is a 65 y.o. male presenting with abdominal pain. The history is provided by the patient.  Abdominal Pain The primary symptoms of the illness include abdominal pain, fever and nausea. The primary symptoms of the illness do not include vomiting, diarrhea or dysuria.  Symptoms associated with the illness do not include constipation or back pain.   the patient is a 65 year old male, with a history of right inguinal hernia repair, who presents to the emergency department complaining of right lower quadrant pain, with nausea, no vomiting, fever.  This morning, and anorexia.  His symptoms began yesterday after he had a bowel movement.  The pain was initially generalized and has localized right lower quadrant.  He says it increases, if he walks or if he went over a bump in the car on the way over here.  He was previously seen by Dr. Amador Cunas in the clinic and sent over here for evaluation of a possible appendicitis.  She does not want any pain medicines at this time and he does not wish to have any antiemetics.  He has not eaten since yesterday.  Past Medical History  Diagnosis Date  . DYSPNEA 02/09/2009  . RESTLESS LEGS SYNDROME 02/28/2009  . SEIZURE DISORDER 02/09/2009    No past surgical history on file.  Family History  Problem Relation Age of Onset  . Cancer Mother     ovarian  . Cancer Father     multiple myeloma  . Stroke Neg Hx     grandparent    History  Substance Use Topics  . Smoking status: Never Smoker   . Smokeless tobacco: Never Used  . Alcohol Use: No      Review of Systems  Constitutional: Positive for fever.  HENT: Negative for congestion.   Eyes: Negative for  redness.  Respiratory: Negative for cough.   Cardiovascular: Negative for chest pain.  Gastrointestinal: Positive for nausea and abdominal pain. Negative for vomiting, diarrhea and constipation.  Genitourinary: Negative for dysuria.  Musculoskeletal: Negative for back pain.  Skin: Negative for rash.  Neurological: Negative for headaches.  Psychiatric/Behavioral: Negative for confusion.    Allergies  Carbamazepine; Metronidazole; Penicillins; and Phenytoin  Home Medications   Current Outpatient Rx  Name Route Sig Dispense Refill  . VITAMIN D 1000 UNITS PO TABS Oral Take 1,000 Units by mouth daily.      . COD LIVER OIL PO OIL Oral Take 5 mLs by mouth daily.      . CO Q 10 60 MG PO CAPS Oral Take 1 tablet by mouth daily.      Marland Kitchen SIMILASAN DRY EYE RELIEF OP SOLN Ophthalmic Apply 1 drop to eye daily as needed. Dry Eyes     . MAGNESIUM 400 MG PO CAPS Oral Take 1 capsule by mouth daily.      Marland Kitchen PHENOBARBITAL 60 MG PO TABS Oral Take 60 mg by mouth at bedtime.      Marland Kitchen VITAMIN C 500 MG PO TABS Oral Take 500 mg by mouth 3 (three) times daily.        BP 116/56  Pulse 73  Temp(Src) 99 F (37.2 C) (Oral)  SpO2 98%  Physical Exam  Vitals reviewed. Constitutional: He is oriented to  person, place, and time. He appears well-developed and well-nourished. No distress.  HENT:  Head: Normocephalic and atraumatic.  Eyes: EOM are normal. Pupils are equal, round, and reactive to light.  Neck: Normal range of motion. Neck supple.  Cardiovascular: Normal rate, regular rhythm and normal heart sounds.   No murmur heard. Pulmonary/Chest: Effort normal and breath sounds normal. No respiratory distress. He has no wheezes. He has no rales.  Abdominal: Soft. Bowel sounds are normal. He exhibits mass. He exhibits no distension. There is tenderness. There is rebound and guarding.       Right inguinal hernia with tenderness in the right inguinal area with guarding and rebound.  Musculoskeletal: Normal range of  motion.  Neurological: He is alert and oriented to person, place, and time. No cranial nerve deficit.  Skin: Skin is warm and dry. He is not diaphoretic.  Psychiatric: He has a normal mood and affect. His behavior is normal.    ED Course  Procedures (including critical care time) 65 year old male, with a history of right inguinal hernia repair presents to the emergency department with right lower quadrant pain, nausea, anorexia, and fever and physical exam findings consistent with appendicitis.  However, he has a right inguinal hernia as well, which is tender.  I suspect that he has an appendicitis rather than an incarcerated hernia.  We will perform a CAT scan to distinguish between the 2 and contact surgery as needed.  Presently, the patient does not want any pain medications or antiemetics.   Labs Reviewed  CBC  DIFFERENTIAL  BASIC METABOLIC PANEL  URINALYSIS, ROUTINE W REFLEX MICROSCOPIC     9:39 PM Discussed with dr. Ezzard Standing. He will take to or.   MDM  Appendicitis        Nicholes Stairs, MD 05/31/11 2140

## 2011-05-31 NOTE — ED Notes (Addendum)
Secondary Assessment: Patient send from [facility] PCP for rule out appendicitis. ABd pain at RLQ, 5/10, onset yesterday at noon, worsen with touch and movement, positive rebound tenderness. No grimacing noted, pt ambulatory. No diaphoresis noted. Pt alert, oriented.

## 2011-05-31 NOTE — ED Notes (Signed)
MD at bedside. 

## 2011-05-31 NOTE — ED Notes (Signed)
Pt sitting in chair. Pt with c/o abd pain rated 4/10. Pt cont to await further dispo. Pt states does not need anything for pain. Pt denies any needs or concerns. IVF cont to infuse. Pt awaits further dispo. WIll cont to monitor

## 2011-05-31 NOTE — Anesthesia Preprocedure Evaluation (Addendum)
Anesthesia Evaluation  Patient identified by MRN, date of birth, ID band Patient awake    Reviewed: Allergy & Precautions, H&P , NPO status , Patient's Chart, lab work & pertinent test results  Airway Mallampati: II TM Distance: >3 FB Neck ROM: Full    Dental No notable dental hx. (+) Teeth Intact   Pulmonary neg pulmonary ROS, shortness of breath,  clear to auscultation  Pulmonary exam normal       Cardiovascular neg cardio ROS Regular Normal    Neuro/Psych Seizures -, Well Controlled,  Negative Neurological ROS  Negative Psych ROS   GI/Hepatic negative GI ROS, Neg liver ROS,   Endo/Other  Negative Endocrine ROS  Renal/GU negative Renal ROS  Genitourinary negative   Musculoskeletal negative musculoskeletal ROS (+)   Abdominal   Peds negative pediatric ROS (+)  Hematology negative hematology ROS (+)   Anesthesia Other Findings   Reproductive/Obstetrics negative OB ROS                        Anesthesia Physical Anesthesia Plan  ASA: II and Emergent  Anesthesia Plan: General   Post-op Pain Management:    Induction: Intravenous and Rapid sequence  Airway Management Planned: Oral ETT  Additional Equipment:   Intra-op Plan:   Post-operative Plan: Extubation in OR  Informed Consent: I have reviewed the patients History and Physical, chart, labs and discussed the procedure including the risks, benefits and alternatives for the proposed anesthesia with the patient or authorized representative who has indicated his/her understanding and acceptance.   Dental advisory given  Plan Discussed with: CRNA  Anesthesia Plan Comments:         Anesthesia Quick Evaluation

## 2011-05-31 NOTE — Patient Instructions (Signed)
Report to the emergency room immediately for further evaluation and treatment of suspected acute appendicitis

## 2011-05-31 NOTE — Progress Notes (Signed)
  Subjective:    Patient ID: Nathan [name], male    DOB: 15-Feb-1946, 65 y.o.   MRN: [id number]  HPI 65 year old patient who was well until approximately noon yesterday when he developed some abdominal pain following lunch;  for the past 24 hours he has had progressive abdominal pain  It has now localized to the right lower quadrant. Initially pain was periumbilical. Associated symptoms included fever or chills poor appetite and nausea. There's been no active vomiting. The patient had a normal bowel movement at lunch yesterday at the onset of his acute illness.   his past medical history is fairly unremarkable but does have a history of seizure disorder as well as restless leg syndrome.  Review of Systems  Constitutional: Positive for fever, chills, appetite change and fatigue.  HENT: Negative for hearing loss, ear pain, congestion, sore throat, trouble swallowing, neck stiffness, dental problem, voice change and tinnitus.   Eyes: Negative for pain, discharge and visual disturbance.  Respiratory: Negative for cough, chest tightness, wheezing and stridor.   Cardiovascular: Negative for chest pain, palpitations and leg swelling.  Gastrointestinal: Positive for nausea and abdominal pain. Negative for vomiting, diarrhea, constipation, blood in stool and abdominal distention.  Genitourinary: Negative for urgency, hematuria, flank pain, discharge, difficulty urinating and genital sores.  Musculoskeletal: Negative for myalgias, back pain, joint swelling, arthralgias and gait problem.  Skin: Negative for rash.  Neurological: Negative for dizziness, syncope, speech difficulty, weakness, numbness and headaches.  Hematological: Negative for adenopathy. Does not bruise/bleed easily.  Psychiatric/Behavioral: Negative for behavioral problems and dysphoric mood. The patient is not nervous/anxious.        Objective:   Physical Exam  Constitutional: He is oriented to person, place, and time. He appears  well-developed and well-nourished. No distress.       Temperature 99.1  HENT:  Head: Normocephalic.  Right Ear: External ear normal.  Left Ear: External ear normal.  Eyes: Conjunctivae and EOM are normal.  Neck: Normal range of motion.  Cardiovascular: Normal rate and normal heart sounds.   Pulmonary/Chest: Breath sounds normal.  Abdominal: Soft. He exhibits no distension and no mass. There is tenderness. There is rebound and guarding.       Bowel sounds were hypoactive  Right lower quadrant tenderness with guarding and mild rebound tenderness noted  Musculoskeletal: Normal range of motion. He exhibits no edema and no tenderness.  Neurological: He is alert and oriented to person, place, and time.  Psychiatric: He has a normal mood and affect. His behavior is normal.          Assessment & Plan:  Right lower quadrant abdominal pain. Patient has a fairly classic presentation for acute appendicitis. The patient is accompanied by his wife and they are both aware of the presumptive diagnosis. It is recommended patient reports to the emergency department for a CT abdominal scan lab and further evaluation. He will likely require surgical consultation and appendectomy

## 2011-05-31 NOTE — ED Notes (Signed)
Pt resting on stretcher. Pt rates abd pain 4/10. Pt cont to await to be transported to OR. Bed locked in low position, SR up x 2, call bell in reach. Will cont to monitor. Family at bedside. Will cont to monitor

## 2011-05-31 NOTE — H&P (Signed)
[facility]  Ovidio Kin, MD,  FACS [address] [place]  [address] [place], [place]    [address] Phone:  [phone] FAX:  [phone]  ASSESSMENT AND PLAN: 1.  Appendicitis, ruptured  Discussed with the patient about going ahead with surgery tonight.  I discussed the indications and complications.  Potential complications include, but are not limited to, bleeding, open surgery and bowel resection.  Length of hospitalization will depend on how bad the appendix is.  2.  Restless leg syndrome 3. History of seizures, etiology unknown  Last seizure 9 years ago  Chief Complaint  Patient presents with  . Abdominal Pain   REFERRING PHYSICIAN:   Dr. Nino Parsley  HISTORY OF PRESENT ILLNESS: Nathan [name] is a 65 y.o. (DOB: 13-Sep-1945)  white male whose primary care physician is Kristian Covey, MD and comes to me today for appendicitis.  Patient started with abdominal pain yesterday.  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  He felt horrible, had a sore right side, when his wife hit bumps in the car, his abdomen hurt. His last meal was yesterday, though he has not vomited.  Dr. Luisa Hart fixed a RIH about 2 years ago.  He had a colonoscopy in 2004 by Dr. Jena Gauss.  No other GI history.    Past Medical History  Diagnosis Date  . DYSPNEA 02/09/2009  . RESTLESS LEGS SYNDROME 02/28/2009  . SEIZURE DISORDER 02/09/2009     History reviewed. No pertinent past surgical history.    Current Facility-Administered Medications  Medication Dose Route Frequency Provider Last Rate Last Dose  . 0.9 %  sodium chloride infusion   Intravenous Continuous Nicholes Stairs, MD 125 mL/hr at 05/31/11 1858 1,000 mL at 05/31/11 1858  . iohexol (OMNIPAQUE) 300 MG/ML injection 100 mL  100 mL Intravenous Once PRN Medication Radiologist   100 mL at 05/31/11 2024   Current Outpatient Prescriptions  Medication Sig Dispense Refill  . cholecalciferol (VITAMIN D) 1000 UNITS tablet Take 1,000 Units  by mouth daily.        . Cod Liver Oil OIL Take 5 mLs by mouth daily.        . Coenzyme Q10 (CO Q 10) 60 MG CAPS Take 1 tablet by mouth daily.        . Homeopathic Products (SIMILASAN DRY EYE RELIEF) SOLN Apply 1 drop to eye daily as needed. Dry Eyes       . Magnesium 400 MG CAPS Take 1 capsule by mouth daily.        Marland Kitchen PHENObarbital (LUMINAL) 60 MG tablet Take 60 mg by mouth at bedtime.        . vitamin C (ASCORBIC ACID) 500 MG tablet Take 500 mg by mouth 3 (three) times daily.            Allergies  Allergen Reactions  . Carbamazepine     REACTION: fatigue, sleepy  . Metronidazole     REACTION: GI upset  . Penicillins     REACTION: childhood, hives?  . Phenytoin     REACTION: rash, leg swelling    REVIEW OF SYSTEMS: Skin:  No history of rash.  No history of abnormal moles. Infection:  No history of hepatitis or HIV.  No history of MRSA. Neurologic: History of seizure.  Etiology unknown.  No history of headaches. Cardiac:  No history of hypertension. Saw Dr. Clarene Duke at [facility].  Neg eval. Pulmonary:  Does not smoke cigarettes.  No asthma or bronchitis.  No OSA/CPAP.  Endocrine:  No diabetes. No thyroid disease. Gastrointestinal:  See HPI. Urologic:  No history of kidney stones.  No history of bladder infections. Musculoskeletal:  No history of joint or back disease. Hematologic:  No bleeding disorder.  No history of anemia.  Not anticoagulated. Psycho-social:  The patient is oriented.   The patient has no obvious psychologic or social impairment to understanding our conversation and plan.  SOCIAL and FAMILY HISTORY: Married.  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  No children.  PHYSICAL EXAM: BP 113/50  Pulse 78  Temp(Src) 100.2 F (37.9 C) (Oral)  Resp 20  SpO2 100%  General: Thin WM, who is alert and generally healthy appearing.  HEENT: Normal. Pupils equal. Good dentition. Neck: Supple. No mass.  No thyroid mass.  Carotid pulse okay with no bruit. Lymph Nodes:   No supraclavicular or cervical nodes. Lungs: Clear to auscultation and symmetric breath sounds. Heart:  RRR. No murmur or rub. Abdomen: Soft. Mass RLQ which is tender. Rectal: Not done. Extremities:  Good strength and ROM  in upper and lower extremities. Neurologic:  Grossly intact to motor and sensory function. Psychiatric: Has normal mood and affect. Behavior is normal.   DATA REVIEWED: Labs and CT scan.  WBC 16,000  Gluc - 125.

## 2011-06-01 ENCOUNTER — Encounter (HOSPITAL_COMMUNITY): Payer: Self-pay | Admitting: Anesthesiology

## 2011-06-01 ENCOUNTER — Encounter (HOSPITAL_COMMUNITY): Admission: EM | Disposition: A | Discharge status: Home / Self Care | Payer: Self-pay | Source: Home / Self Care

## 2011-06-01 ENCOUNTER — Emergency Department (HOSPITAL_COMMUNITY): Payer: BC Managed Care – PPO | Admitting: Anesthesiology

## 2011-06-01 ENCOUNTER — Other Ambulatory Visit (INDEPENDENT_AMBULATORY_CARE_PROVIDER_SITE_OTHER): Payer: Self-pay | Admitting: Surgery

## 2011-06-01 ENCOUNTER — Ambulatory Visit: Payer: BC Managed Care – PPO | Admitting: Internal Medicine

## 2011-06-01 ENCOUNTER — Other Ambulatory Visit: Payer: Self-pay

## 2011-06-01 DIAGNOSIS — K352 Acute appendicitis with generalized peritonitis, without abscess: Secondary | ICD-10-CM

## 2011-06-01 DIAGNOSIS — K3533 Acute appendicitis with perforation and localized peritonitis, with abscess: Secondary | ICD-10-CM | POA: Diagnosis present

## 2011-06-01 HISTORY — PX: APPENDECTOMY: SHX54

## 2011-06-01 HISTORY — PX: LAPAROSCOPIC APPENDECTOMY: SHX408

## 2011-06-01 SURGERY — APPENDECTOMY, LAPAROSCOPIC
Anesthesia: General | Site: Abdomen | Wound class: Dirty or Infected

## 2011-06-01 MED ORDER — HEPARIN SODIUM (PORCINE) 5000 UNIT/ML IJ SOLN
5000.0000 [IU] | Freq: Three times a day (TID) | INTRAMUSCULAR | Status: DC
  Filled 2011-06-01 (×4): qty 1

## 2011-06-01 MED ORDER — ROCURONIUM BROMIDE 100 MG/10ML IV SOLN
INTRAVENOUS | Status: DC | PRN
  Administered 2011-06-01: 10 mg via INTRAVENOUS
  Administered 2011-06-01: 30 mg via INTRAVENOUS
  Administered 2011-06-01: 10 mg via INTRAVENOUS

## 2011-06-01 MED ORDER — GLYCOPYRROLATE 0.2 MG/ML IJ SOLN
INTRAMUSCULAR | Status: DC | PRN
  Administered 2011-06-01: .4 mg via INTRAVENOUS

## 2011-06-01 MED ORDER — KCL IN DEXTROSE-NACL 20-5-0.45 MEQ/L-%-% IV SOLN
INTRAVENOUS | Status: DC
  Administered 2011-06-01 (×3): via INTRAVENOUS
  Administered 2011-06-02: 125 mL/h via INTRAVENOUS
  Administered 2011-06-02 – 2011-06-03 (×2): via INTRAVENOUS
  Filled 2011-06-01 (×9): qty 1000

## 2011-06-01 MED ORDER — HYDROCODONE-ACETAMINOPHEN 5-325 MG PO TABS: 1.0000 | ORAL_TABLET | ORAL | Status: DC | PRN

## 2011-06-01 MED ORDER — HEPARIN SODIUM (PORCINE) 5000 UNIT/ML IJ SOLN
5000.0000 [IU] | Freq: Three times a day (TID) | INTRAMUSCULAR | Status: DC
  Administered 2011-06-01 – 2011-06-12 (×33): 5000 [IU] via SUBCUTANEOUS
  Filled 2011-06-01 (×36): qty 1

## 2011-06-01 MED ORDER — ONDANSETRON HCL 4 MG PO TABS
4.0000 mg | ORAL_TABLET | Freq: Four times a day (QID) | ORAL | Status: DC | PRN
  Administered 2011-06-01 – 2011-06-06 (×5): 4 mg via ORAL
  Filled 2011-06-01 (×5): qty 1

## 2011-06-01 MED ORDER — LIDOCAINE HCL (CARDIAC) 20 MG/ML IV SOLN
INTRAVENOUS | Status: DC | PRN
  Administered 2011-06-01: 80 mg via INTRAVENOUS

## 2011-06-01 MED ORDER — MEPERIDINE HCL 25 MG/ML IJ SOLN: 6.2500 mg | INTRAMUSCULAR | Status: DC | PRN

## 2011-06-01 MED ORDER — LACTATED RINGERS IR SOLN
Status: DC | PRN
  Administered 2011-06-01 (×2): 1000 mL

## 2011-06-01 MED ORDER — NEOSTIGMINE METHYLSULFATE 1 MG/ML IJ SOLN
INTRAMUSCULAR | Status: DC | PRN
  Administered 2011-06-01: 3 mg via INTRAVENOUS

## 2011-06-01 MED ORDER — BUPIVACAINE HCL (PF) 0.25 % IJ SOLN
INTRAMUSCULAR | Status: DC | PRN
  Administered 2011-06-01: 19 mL

## 2011-06-01 MED ORDER — MORPHINE SULFATE 2 MG/ML IJ SOLN: 1.0000 mg | INTRAMUSCULAR | Status: DC | PRN

## 2011-06-01 MED ORDER — LACTATED RINGERS IV SOLN
INTRAVENOUS | Status: DC | PRN
  Administered 2011-06-01 (×2): via INTRAVENOUS

## 2011-06-01 MED ORDER — PROPOFOL 10 MG/ML IV EMUL
INTRAVENOUS | Status: DC | PRN
  Administered 2011-06-01: 180 mg via INTRAVENOUS
  Administered 2011-06-01: 20 mg via INTRAVENOUS

## 2011-06-01 MED ORDER — KETOROLAC TROMETHAMINE 30 MG/ML IJ SOLN
15.0000 mg | Freq: Once | INTRAMUSCULAR | Status: AC | PRN
  Administered 2011-06-01: 30 mg via INTRAVENOUS
  Filled 2011-06-01: qty 1

## 2011-06-01 MED ORDER — SUCCINYLCHOLINE CHLORIDE 20 MG/ML IJ SOLN
INTRAMUSCULAR | Status: DC | PRN
  Administered 2011-06-01: 100 mg via INTRAVENOUS

## 2011-06-01 MED ORDER — BUPIVACAINE HCL (PF) 0.25 % IJ SOLN
INTRAMUSCULAR | Status: AC
  Filled 2011-06-01: qty 30

## 2011-06-01 MED ORDER — ONDANSETRON HCL 4 MG/2ML IJ SOLN
INTRAMUSCULAR | Status: DC | PRN
  Administered 2011-06-01: 4 mg via INTRAVENOUS

## 2011-06-01 MED ORDER — PHENOBARBITAL 32.4 MG PO TABS
60.0000 mg | ORAL_TABLET | Freq: Every day | ORAL | Status: DC
  Administered 2011-06-01 – 2011-06-17 (×17): 64.8 mg via ORAL
  Filled 2011-06-01 (×2): qty 2
  Filled 2011-06-01: qty 1
  Filled 2011-06-01 (×7): qty 2
  Filled 2011-06-01: qty 1
  Filled 2011-06-01 (×7): qty 2

## 2011-06-01 MED ORDER — FENTANYL CITRATE 0.05 MG/ML IJ SOLN
INTRAMUSCULAR | Status: DC | PRN
  Administered 2011-06-01: 50 ug via INTRAVENOUS
  Administered 2011-06-01 (×2): 100 ug via INTRAVENOUS

## 2011-06-01 SURGICAL SUPPLY — 38 items
APPLIER CLIP ROT 10 11.4 M/L (STAPLE)
BENZOIN TINCTURE PRP APPL 2/3 (GAUZE/BANDAGES/DRESSINGS) IMPLANT
CANISTER SUCTION 2500CC (MISCELLANEOUS) ×2 IMPLANT
CLIP APPLIE ROT 10 11.4 M/L (STAPLE) IMPLANT
CLOTH BEACON ORANGE TIMEOUT ST (SAFETY) ×2 IMPLANT
CUTTER FLEX LINEAR 45M (STAPLE) ×2 IMPLANT
DECANTER SPIKE VIAL GLASS SM (MISCELLANEOUS) ×2 IMPLANT
DERMABOND ADVANCED (GAUZE/BANDAGES/DRESSINGS)
DERMABOND ADVANCED .7 DNX12 (GAUZE/BANDAGES/DRESSINGS) IMPLANT
DRAPE LAPAROSCOPIC ABDOMINAL (DRAPES) ×2 IMPLANT
ELECT REM PT RETURN 9FT ADLT (ELECTROSURGICAL) ×2
ELECTRODE REM PT RTRN 9FT ADLT (ELECTROSURGICAL) ×1 IMPLANT
ENDOLOOP SUT PDS II  0 18 (SUTURE)
ENDOLOOP SUT PDS II 0 18 (SUTURE) IMPLANT
GLOVE BIOGEL PI IND STRL 7.0 (GLOVE) ×1 IMPLANT
GLOVE BIOGEL PI INDICATOR 7.0 (GLOVE) ×1
GLOVE SURG SIGNA 7.5 PF LTX (GLOVE) ×2 IMPLANT
GOWN STRL NON-REIN LRG LVL3 (GOWN DISPOSABLE) ×2 IMPLANT
GOWN STRL REIN XL XLG (GOWN DISPOSABLE) ×4 IMPLANT
IV LACTATED RINGERS 1000ML (IV SOLUTION) ×6 IMPLANT
KIT BASIN OR (CUSTOM PROCEDURE TRAY) ×2 IMPLANT
PENCIL BUTTON HOLSTER BLD 10FT (ELECTRODE) IMPLANT
POUCH SPECIMEN RETRIEVAL 10MM (ENDOMECHANICALS) ×2 IMPLANT
RELOAD 45 VASCULAR/THIN (ENDOMECHANICALS) IMPLANT
RELOAD STAPLE TA45 3.5 REG BLU (ENDOMECHANICALS) ×2 IMPLANT
SCALPEL HARMONIC ACE (MISCELLANEOUS) ×2 IMPLANT
SET IRRIG TUBING LAPAROSCOPIC (IRRIGATION / IRRIGATOR) ×2 IMPLANT
SOLUTION ANTI FOG 6CC (MISCELLANEOUS) ×2 IMPLANT
STRIP CLOSURE SKIN 1/4X3 (GAUZE/BANDAGES/DRESSINGS) ×2 IMPLANT
SUT MON AB 5-0 PS2 18 (SUTURE) ×4 IMPLANT
SUT VIC AB 2-0 UR6 27 (SUTURE) ×4 IMPLANT
TOWEL OR 17X26 10 PK STRL BLUE (TOWEL DISPOSABLE) ×2 IMPLANT
TRAY FOLEY CATH 14FRSI W/METER (CATHETERS) ×2 IMPLANT
TRAY LAP CHOLE (CUSTOM PROCEDURE TRAY) ×2 IMPLANT
TROCAR XCEL BLUNT TIP 100MML (ENDOMECHANICALS) ×2 IMPLANT
TROCAR Z-THREAD FIOS 11X100 BL (TROCAR) ×2 IMPLANT
TROCAR Z-THREAD FIOS 5X100MM (TROCAR) ×2 IMPLANT
TUBING INSUFFLATION 10FT LAP (TUBING) ×2 IMPLANT

## 2011-06-01 NOTE — Progress Notes (Signed)
Chart reviewed and UR completed. 

## 2011-06-01 NOTE — Progress Notes (Signed)
Day of Surgery  Day of Surgery  Subjective: Patient is more alert. I reviewed the pictures at Dr. Ezzard Standing provided on the chart from his ruptured appendix. These showed the appendix stuck up in mesh that was placed earlier by Dr. Luisa Hart and inguinal hernia repair. Patient informed that he would likely be in the hospital for antibiotics for a few days since his appendix was ruptured.  Objective: Vital signs in last 24 hours: Temp:  [99 F (37.2 C)-102.7 F (39.3 C)] 99.9 F (37.7 C) (11/16 1001) Pulse Rate:  [73-93] 85  (11/16 1001) Resp:  [18-20] 18  (11/16 1001) BP: (100-133)/(50-69) 110/69 mmHg (11/16 1001) SpO2:  [94 %-100 %] 97 % (11/16 1001) Weight:  [150 lb (68.04 kg)-152 lb (68.947 kg)] 150 lb (68.04 kg) (11/16 0515)   Intake/Output from previous day: 11/15 0701 - 11/16 0700 In: 1900 [I.V.:1900] Out: 180 [Urine:180] Intake/Output this shift: Total I/O In: 1 [P.O.:1] Out: 100 [Urine:100]  Dressings dry abdomen is less tender.  Lab Results:   [facility] 05/31/11 1850  WBC 16.6*  HGB 13.7  HCT 40.1  PLT 137*   BMET  Basename 05/31/11 1850  NA 136  K 4.0  CL 98  CO2 29  GLUCOSE 125*  BUN 14  CREATININE 0.72  CALCIUM 9.1   PT/INR No results found for this basename: LABPROT:2,INR:2 in the last 72 hours  Studies/Results: Ct Abdomen Pelvis W Contrast  05/31/2011  *RADIOLOGY REPORT*  Clinical Data: Right lower quadrant abdominal pain.  CT ABDOMEN AND PELVIS WITH CONTRAST  Technique:  Multidetector CT imaging of the abdomen and pelvis was performed following the standard protocol during bolus administration of intravenous contrast.  Contrast: OMNIPAQUE IOHEXOL 300 MG/ML IV SOLN  Comparison: None  Findings: The lung bases are clear.  No pleural effusion. Significant pectus deformity.  The liver is normal except for a calcified granuloma and a few small probable cysts.  No intrapelvic biliary dilatation.  The gallbladder appears normal.  No common bile duct  dilatation.  The pancreas is normal.  The spleen is normal in size.  No focal lesions.  The adrenal glands are normal.  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  A small left renal calculus is noted but no findings for acute obstruction.  The stomach, duodenum and small bowel are unremarkable except for the mild inflammation involving the terminal ileum.  This is due to appendicitis.  The appendix is dilated and fluid-filled with enhancement of the wall.  It is surrounded by inflammatory mass and a walled off perforation is suspected.  No discrete drainable abscess.  Multiple appendicoliths are noted.  The rectum, sigmoid colon and the remainder the colon grossly normal.  The bladder, prostate gland and seminal vesicles are grossly normal.  There is median lobe hypertrophy of the prostate gland.  The bony structures are intact.  IMPRESSION:  1.  Findings consistent with acute appendicitis.  Suspect a walled off a perforated appendicitis without discrete abscess.  Multiple appendicoliths. 2.  Associated inflammation of the cecum and terminal ileum. 3.  Horseshoe kidney deformity.  Original Report Authenticated By: P. Loralie Champagne, M.D.    Anti-infectives: Anti-infectives     Start     Dose/Rate Route Frequency Ordered Stop   06/01/11 0947   tigecycline (TYGACIL) 50 mg in sodium chloride 0.9 % 100 mL IVPB  50 mg 200 mL/hr over 30 Minutes Intravenous Every 12 hours 05/31/11 2148     05/31/11 2200   tigecycline (TYGACIL) 100 mg in sodium chloride 0.9 % 100 mL IVPB        100 mg 200 mL/hr over 30 Minutes Intravenous  Once 05/31/11 2148 05/31/11 2256          Assessment/Plan: Continue antibiotic therapy in the hospital. We'll observe and advanced more slowly. Day of Surgery    LOS: 1 day    Matt B. Daphine Deutscher, MD, [facility] [phone]  beeper [phone]  06/01/2011 10:25 AM

## 2011-06-01 NOTE — Op Note (Signed)
NAMENORVELL, Nathan [name] NO.:  [id number]  MEDICAL RECORD NO.:  [id number]  LOCATION:  1536                         FACILITY:  [facility]  PHYSICIAN:  Sandria Bales. Ezzard Standing, M.D.  DATE OF BIRTH:  June 22, 1946  DATE OF PROCEDURE: 01 Jun 2011                              OPERATIVE REPORT   PREOPERATIVE DIAGNOSIS:  Appendicitis, probable abscess.  POSTOPERATIVE DIAGNOSIS:  Ruptured suppurative appendicitis with focal abscess stuck to the undersurface of the right inguinal hernia mesh.  SURGEON:  Sandria Bales. Ezzard Standing, MD  ASSISTANT:  There is no first assistant.  PROCEDURE:  Laparoscopic appendectomy.  ANESTHESIA:  General endotracheal with approximately 19 cc of 0.25% Marcaine.  COMPLICATION:  None.  INDICATION FOR PROCEDURE:  Nathan [name] is a 65 year old white male, patient of Dr. Evelena Peat, who presents with a 24-hour history of worsening abdominal pain.  CT scan in the [facility] emergency room was consistent with appendicitis, but looked likely to be ruptured with an abscess.  He comes to the operating room for appendectomy.  The indication and potential complications of surgery were explained to the patient.  Potential complications include, but are not limited to bleeding, infection, bowel injury, or open surgery.  OPERATIVE NOTE:  The patient was placed in the supine position, given a general endotracheal anesthetic, was supervised by Dr. Eilene Ghazi in room #1 at [facility].  A time-out was held and surgical checklist run.  The patient was given Tygacil preoperatively as antibiotic.    His abdomen was prepped with ChloraPrep and sterilely draped, accessed the abdominal cavity through an infraumbilical incision with a 12-mm Hasson trocar.  This was secured with a 0 Vicryl suture.  A 5-mm trocar was placed in the right upper quadrant, and a 11-mm placed in the left lower quadrant.  An abdominal exploration carried out.  The right and left lobes of the liver  were unremarkable, gallbladder unremarkable, stomach unremarkable, and the bowel that I could see was unremarkable.  In the right lower quadrant, he had an inflammatory mass.  I teased this Off the anterior abdominal wall.  Preoperatively I could palpable a RLQ abdominal mass, I was actually palpating was this old mesh from a right inguinal hernia repair by Dr. Luisa Hart about 2 years ago. Reviewing the op note, it looks like he used a large Prolene hernia system mesh which was brought to the preperitoneal space.(operative note of July 30,2010.)  Anyway, the appendix was gangrenous and infarcted and locally perforated with an abscess, and stuck up under the mesh.  In getting this down, I actually removed some pieces of mesh with the appendix.  Actually, I first divided the base of the appendix with a blue load of the 45-mm Ethicon Endo GIA stapler, and then I could come from both the proximal and distal ends of the appendix to get up to this mesh device at the peritoneum.  Photos were taken and placed in the chart.  The appendix was placed in an Endocatch bag and delivered through the umbilicus.  I also tried to debride some of the mesh pieces.  The mesh per se did not look infected.  I irrigated the abdomen  with 2 L of saline.  I reinspected the cecum which looked okay.  He had some omentum stuck up there, which also looked okay.  I then removed the trocars in turn.  The umbilical port was closed with 0 Vicryl suture, the left lower quadrant port was closed with 0 Vicryl suture, the skin at each port was closed with 5-0 Monocryl suture and painted with Dermabond.  The patient was transported to recovery room in good condition.  Sponge and needle counts were correct at the end of the case.   Sandria Bales. Ezzard Standing, M.D., FACS   DHN/MEDQ  D:  06/01/2011  T:  06/01/2011  Job:  [id number]  cc:   Evelena Peat, M.D.

## 2011-06-01 NOTE — Anesthesia Postprocedure Evaluation (Signed)
  Anesthesia Post-op Note  Patient: Nathan [name]  Procedure(s) Performed:  APPENDECTOMY LAPAROSCOPIC  Patient Location: PACU  Anesthesia Type: General  Level of Consciousness: awake and alert   Airway and Oxygen Therapy: Patient Spontanous Breathing  Post-op Pain: mild  Post-op Assessment: Post-op Vital signs reviewed, Patient's Cardiovascular Status Stable, Respiratory Function Stable, Patent Airway and No signs of Nausea or vomiting  Post-op Vital Signs: stable  Complications: No apparent anesthesia complications

## 2011-06-01 NOTE — Brief Op Note (Signed)
05/31/2011 - 06/01/2011  4:03 AM  PATIENT:  Nathan [name], 65 y.o., male, MRN: [id number]  PREOP DIAGNOSIS:  appendicitis  POSTOP DIAGNOSIS:   1.  Ruptured appendicitis, with abscess, stuck to undersurface of right inguinal mesh.    2.  Right inguinal mesh, adjacent to abscess.  PROCEDURE:   Procedure(s): APPENDECTOMY LAPAROSCOPIC  SURGEON:   Ovidio Kin, M.D.  ASSISTANT:   None  ANESTHESIA:   general  EBL:  minimall  Total I/O In: 1000 [I.V.:1000] Out: 105 [Urine:105]  BLOOD ADMINISTERED: none  DRAINS: none   LOCAL MEDICATIONS USED:   19 cc 1/4% marcaine  SPECIMEN:   Appendix (and small piece of mesh)  COUNTS CORRECT:  YES  INDICATIONS FOR PROCEDURE:  Nathan [name] is a 65 y.o. (DOB: 28-Jun-1946) white male whose primary care physician is Kristian Covey, MD and comes for appendectomy.   The indications and risks of the surgery were explained to the patient.  The risks include, but are not limited to, infection, bleeding, and nerve injury.  Note dictated to:   #[id number]  06/01/2011 DN

## 2011-06-01 NOTE — ED Notes (Signed)
Pt to OR at this time.

## 2011-06-01 NOTE — Transfer of Care (Signed)
Immediate Anesthesia Transfer of Care Note  Patient: Nathan [name]  Procedure(s) Performed:  APPENDECTOMY LAPAROSCOPIC  Patient Location: PACU  Anesthesia Type: General  Level of Consciousness: awake, alert  and oriented  Airway & Oxygen Therapy: Patient Spontanous Breathing and Patient connected to face mask oxygen  Post-op Assessment: Report given to PACU RN and Post -op Vital signs reviewed and stable  Post vital signs: Reviewed and stable  Complications: No apparent anesthesia complications

## 2011-06-02 ENCOUNTER — Inpatient Hospital Stay (HOSPITAL_COMMUNITY): Payer: BC Managed Care – PPO

## 2011-06-02 LAB — DIFFERENTIAL
Basophils Relative: 0 % (ref 0–1)
Lymphs Abs: 0.5 10*3/uL — ABNORMAL LOW (ref 0.7–4.0)
Monocytes Relative: 10 % (ref 3–12)
Neutro Abs: 10 10*3/uL — ABNORMAL HIGH (ref 1.7–7.7)
Neutrophils Relative %: 86 % — ABNORMAL HIGH (ref 43–77)

## 2011-06-02 LAB — URINALYSIS, ROUTINE W REFLEX MICROSCOPIC
Glucose, UA: NEGATIVE mg/dL
Nitrite: NEGATIVE
Protein, ur: NEGATIVE mg/dL
pH: 6.5 (ref 5.0–8.0)

## 2011-06-02 LAB — CBC
HCT: 35.4 % — ABNORMAL LOW (ref 39.0–52.0)
Hemoglobin: 12.3 g/dL — ABNORMAL LOW (ref 13.0–17.0)
MCH: 32.1 pg (ref 26.0–34.0)
MCHC: 34.7 g/dL (ref 30.0–36.0)

## 2011-06-02 MED ORDER — PROMETHAZINE HCL 25 MG/ML IJ SOLN
12.5000 mg | INTRAMUSCULAR | Status: DC | PRN
Start: 2011-06-02 — End: 2011-06-18
  Administered 2011-06-02: 12.5 mg via INTRAVENOUS
  Filled 2011-06-02: qty 1

## 2011-06-02 NOTE — Progress Notes (Signed)
1 Day Post-Op  Subjective: Patient complains of nausea and diarrhea. He has not vomited. He says his abdomen is a little bit distended. He had 3 loose stools yesterday but it seems to have subsided this morning. He has had fever up to 102 but no chills or right course. He is on [place] antibiotic. He has good urine output but says that his urine is "rust-colored".he has been able to ambulate in the halls. He feels a little sleepy now because of the Phenergan he was given.  Lab work shows white blood cell count has gone from 16,000 to 11,700.  Objective: Vital signs in last 24 hours: Temp:  [98 F (36.7 C)-99.4 F (37.4 C)] 98 F (36.7 C) (11/17 0947) Pulse Rate:  [67-93] 77  (11/17 0947) Resp:  [18-20] 20  (11/17 0947) BP: (98-115)/(61-72) 115/67 mmHg (11/17 0947) SpO2:  [95 %-100 %] 100 % (11/17 0947) Last BM Date: 06/01/11  Intake/Output from previous day: 11/16 0701 - 11/17 0700 In: 3686 [P.O.:361; I.V.:3125; IV Piggyback:200] Out: 825 [Urine:825] Intake/Output this shift: Total I/O In: -  Out: 250 [Urine:250]  Resp: breath sounds are diminished at both bases with some egophony. There is no wheezing and no rhonchi. GI: abdomen is distended but generally soft. Bowel sounds are hypoactive and tinkling. Mild tenderness. He feels warm. Wounds looked okay. Question whether there may be a faint erythema in the right flank. Right groin looks normal.  Lab Results:  Results for orders placed during the hospital encounter of 05/31/11 (from the past 24 hour(s))  CBC     Status: Abnormal   Collection Time   06/02/11  4:32 AM      Component Value Range   WBC 11.7 (*) 4.0 - 10.5 (K/uL)   RBC 3.83 (*) 4.22 - 5.81 (MIL/uL)   Hemoglobin 12.3 (*) 13.0 - 17.0 (g/dL)   HCT 16.1 (*) 09.6 - 52.0 (%)   MCV 92.4  78.0 - 100.0 (fL)   MCH 32.1  26.0 - 34.0 (pg)   MCHC 34.7  30.0 - 36.0 (g/dL)   RDW 04.5  40.9 - 81.1 (%)   Platelets 116 (*) 150 - 400 (K/uL)  DIFFERENTIAL     Status: Abnormal     Collection Time   06/02/11  4:32 AM      Component Value Range   Neutrophils Relative 86 (*) 43 - 77 (%)   Neutro Abs 10.0 (*) 1.7 - 7.7 (K/uL)   Lymphocytes Relative 4 (*) 12 - 46 (%)   Lymphs Abs 0.5 (*) 0.7 - 4.0 (K/uL)   Monocytes Relative 10  3 - 12 (%)   Monocytes Absolute 1.2 (*) 0.1 - 1.0 (K/uL)   Eosinophils Relative 0  0 - 5 (%)   Eosinophils Absolute 0.0  0.0 - 0.7 (K/uL)   Basophils Relative 0  0 - 1 (%)   Basophils Absolute 0.0  0.0 - 0.1 (K/uL)   Smear Review PLATELET COUNT CONFIRMED BY SMEAR       Studies/Results: @RISRSLT24 @     . heparin subcutaneous  5,000 Units Subcutaneous Q8H  . PHENobarbital  64.8 mg Oral QHS  . tigecycline (TYGACIL) IVPB  50 mg Intravenous Q12H     Assessment/Plan: s/p Procedure(s): APPENDECTOMY LAPAROSCOPIC postop fever to 102. This may be simply due to atelectasis.Marland Kitchen He does not look toxic, however this is of some concern. Continue Tygacil IV.  Postop ileus. This is not surprising since she had a ruptured appendicitis and required some dissection. I do  not think that he has had a direct complication. Check abdominal Xrays.  Report of rust-colored urine. Will check urinalysis now.  I am going to get a chest x-ray to see if he has pneumonia or atelectasis.  I am going to get abdominal films to make sure that his abdominal distention is simply due to ileus.  Check stool for C. Difficile because of the diarrhea.  We will check CBC and complete metabolic panel tomorrow.  Continue ambulation.   LOS: 2 days    Derrian Rodak M 06/02/2011  . .prob

## 2011-06-03 LAB — COMPREHENSIVE METABOLIC PANEL
Albumin: 2.7 g/dL — ABNORMAL LOW (ref 3.5–5.2)
BUN: 16 mg/dL (ref 6–23)
Calcium: 8 mg/dL — ABNORMAL LOW (ref 8.4–10.5)
Creatinine, Ser: 0.6 mg/dL (ref 0.50–1.35)
GFR calc Af Amer: >90 mL/min (ref >90)
Glucose, Bld: 119 mg/dL — ABNORMAL HIGH (ref 70–99)
Total Protein: 5.4 g/dL — ABNORMAL LOW (ref 6.0–8.3)

## 2011-06-03 LAB — CBC
HCT: 34 % — ABNORMAL LOW (ref 39.0–52.0)
Hemoglobin: 12 g/dL — ABNORMAL LOW (ref 13.0–17.0)
MCH: 31.9 pg (ref 26.0–34.0)
MCHC: 35.3 g/dL (ref 30.0–36.0)
RDW: 12.8 % (ref 11.5–15.5)

## 2011-06-03 LAB — CLOSTRIDIUM DIFFICILE BY PCR: Toxigenic C. Difficile by PCR: NEGATIVE

## 2011-06-03 MED ORDER — LOPERAMIDE HCL 2 MG PO CAPS
2.0000 mg | ORAL_CAPSULE | ORAL | Status: DC | PRN
  Administered 2011-06-03 (×2): 2 mg via ORAL
  Filled 2011-06-03 (×2): qty 1

## 2011-06-03 MED ORDER — SODIUM CHLORIDE 0.9 % IV SOLN
INTRAVENOUS | Status: DC
  Administered 2011-06-03 – 2011-06-04 (×5): via INTRAVENOUS
  Administered 2011-06-05: 125 mL/h via INTRAVENOUS
  Administered 2011-06-05 (×2): via INTRAVENOUS
  Administered 2011-06-06: 125 mL/h via INTRAVENOUS
  Administered 2011-06-07 (×2): via INTRAVENOUS

## 2011-06-03 MED ORDER — VANCOMYCIN 50 MG/ML ORAL SOLUTION
500.0000 mg | Freq: Four times a day (QID) | ORAL | Status: DC
  Filled 2011-06-03 (×4): qty 10

## 2011-06-03 MED ORDER — METRONIDAZOLE 500 MG PO TABS: 500.0000 mg | ORAL_TABLET | Freq: Three times a day (TID) | ORAL | Status: DC

## 2011-06-03 NOTE — Progress Notes (Signed)
2 Days Post-Op  Subjective: Patient is alert and stable. His biggest complaint is continuous diarrhea, perhaps 10 stools and 24 hour period. C. Difficile toxin result is still pending. Fevers have resolved. He is not tachycardic. No vomiting but anorexic. Chest x-ray shows mild atelectasis but no pneumonia. Abdominal x-rays just showed a mild to moderate colonic ileus but no other graft maladies. Hyponatremia is noted on his labs.  Objective: Vital signs in last 24 hours: Temp:  [98 F (36.7 C)-99.5 F (37.5 C)] 99.5 F (37.5 C) (11/18 0631) Pulse Rate:  [77-88] 82  (11/18 0631) Resp:  [20] 20  (11/18 0631) BP: (113-120)/(59-70) 117/70 mmHg (11/18 0631) SpO2:  [95 %-100 %] 96 % (11/18 0631) Last BM Date: 06/02/11  Intake/Output from previous day: 11/17 0701 - 11/18 0700 In: 3321.3 [P.O.:240; I.V.:2881.3; IV Piggyback:200] Out: 750 [Urine:750] Intake/Output this shift:    Resp: diminished breath sounds base - bilateral. GI: aabdomen is distended but generally soft with active bowel sounds. All trocar sites are healing without any complications. There is still some faint erythema in the right flank and lateral gluteus areas. This blanches  a little bit but is not indurated it does not appear to be tender. It is less than yesterday.  Lab Results:  Results for orders placed during the hospital encounter of 05/31/11 (from the past 24 hour(s))  URINALYSIS, ROUTINE W REFLEX MICROSCOPIC     Status: Abnormal   Collection Time   06/02/11 12:34 PM      Component Value Range   Color, Urine AMBER (*) YELLOW    Appearance CLEAR  CLEAR    Specific Gravity, Urine 1.019  1.005 - 1.030    pH 6.5  5.0 - 8.0    Glucose, UA NEGATIVE  NEGATIVE (mg/dL)   Hgb urine dipstick NEGATIVE  NEGATIVE    Bilirubin Urine NEGATIVE  NEGATIVE    Ketones, ur NEGATIVE  NEGATIVE (mg/dL)   Protein, ur NEGATIVE  NEGATIVE (mg/dL)   Urobilinogen, UA 1.0  0.0 - 1.0 (mg/dL)   Nitrite NEGATIVE  NEGATIVE    Leukocytes,  UA NEGATIVE  NEGATIVE   CBC     Status: Abnormal   Collection Time   06/03/11  5:25 AM      Component Value Range   WBC 11.1 (*) 4.0 - 10.5 (K/uL)   RBC 3.76 (*) 4.22 - 5.81 (MIL/uL)   Hemoglobin 12.0 (*) 13.0 - 17.0 (g/dL)   HCT 16.1 (*) 09.6 - 52.0 (%)   MCV 90.4  78.0 - 100.0 (fL)   MCH 31.9  26.0 - 34.0 (pg)   MCHC 35.3  30.0 - 36.0 (g/dL)   RDW 04.5  40.9 - 81.1 (%)   Platelets 144 (*) 150 - 400 (K/uL)  COMPREHENSIVE METABOLIC PANEL     Status: Abnormal   Collection Time   06/03/11  5:25 AM      Component Value Range   Sodium 126 (*) 135 - 145 (mEq/L)   Potassium 4.1  3.5 - 5.1 (mEq/L)   Chloride 95 (*) 96 - 112 (mEq/L)   CO2 26  19 - 32 (mEq/L)   Glucose, Bld 119 (*) 70 - 99 (mg/dL)   BUN 16  6 - 23 (mg/dL)   Creatinine, Ser 9.14  0.50 - 1.35 (mg/dL)   Calcium 8.0 (*) 8.4 - 10.5 (mg/dL)   Total Protein 5.4 (*) 6.0 - 8.3 (g/dL)   Albumin 2.7 (*) 3.5 - 5.2 (g/dL)   AST 23  0 - 37 (  U/L)   ALT 15  0 - 53 (U/L)   Alkaline Phosphatase 61  39 - 117 (U/L)   Total Bilirubin 0.8  0.3 - 1.2 (mg/dL)   GFR calc non Af Amer >90  >90 (mL/min)   GFR calc Af Amer >90  >90 (mL/min)     Studies/Results: @RISRSLT24 @     . heparin subcutaneous  5,000 Units Subcutaneous Q8H  . metroNIDAZOLE  500 mg Oral Q8H  . PHENobarbital  64.8 mg Oral QHS  . tigecycline (TYGACIL) IVPB  50 mg Intravenous Q12H     Assessment/Plan: s/p Procedure(s): APPENDECTOMY LAPAROSCOPIC he has diarrhea and an apparent colonic ileus. The frequency and volumes of his stool suggest colitis.  I will empirically start him on oral vancomycin until the C. Difficile test comes back.  He will be allowed Imodium every 8 hours to help control the diarrhea.  Start clear liquid diet.  IV fluids are adjusted to address the hyponatremia.  Lab work Field seismologist  because of the severity of the appendicitis and its adherence to the right inguinal mesh.  Continued to push ambulation.   LOS: 3 days     Michiah Mudry M 06/03/2011  . .prob

## 2011-06-04 ENCOUNTER — Encounter (HOSPITAL_COMMUNITY): Payer: Self-pay | Admitting: Surgery

## 2011-06-04 LAB — CBC
Hemoglobin: 11.3 g/dL — ABNORMAL LOW (ref 13.0–17.0)
MCHC: 34.3 g/dL (ref 30.0–36.0)
RBC: 3.59 MIL/uL — ABNORMAL LOW (ref 4.22–5.81)
WBC: 8.7 10*3/uL (ref 4.0–10.5)

## 2011-06-04 LAB — BASIC METABOLIC PANEL
CO2: 26 mEq/L (ref 19–32)
Chloride: 102 mEq/L (ref 96–112)
GFR calc non Af Amer: >90 mL/min (ref >90)
Glucose, Bld: 99 mg/dL (ref 70–99)
Potassium: 4 mEq/L (ref 3.5–5.1)
Sodium: 132 mEq/L — ABNORMAL LOW (ref 135–145)

## 2011-06-04 MED ORDER — METOCLOPRAMIDE HCL 5 MG/ML IJ SOLN
10.0000 mg | Freq: Four times a day (QID) | INTRAMUSCULAR | Status: DC
  Administered 2011-06-04 – 2011-06-06 (×7): 10 mg via INTRAVENOUS
  Filled 2011-06-04 (×13): qty 2

## 2011-06-04 MED ORDER — KETOROLAC TROMETHAMINE 15 MG/ML IJ SOLN
15.0000 mg | Freq: Four times a day (QID) | INTRAMUSCULAR | Status: DC
  Administered 2011-06-04 – 2011-06-06 (×8): 15 mg via INTRAVENOUS
  Filled 2011-06-04 (×13): qty 1

## 2011-06-04 MED ORDER — BISACODYL 10 MG RE SUPP
10.0000 mg | Freq: Once | RECTAL | Status: AC
  Administered 2011-06-04: 10 mg via RECTAL
  Filled 2011-06-04: qty 1

## 2011-06-04 NOTE — Progress Notes (Signed)
3 Days Post-Op  Subjective: Awake and alert. Complain of abdominal distention. Diarrhea has completely stopped after 2 doses of Imodium. Tolerating sips of clear liquids. Denies nausea or vomiting. He and his wife are anxious about the distention.  C. Difficile test was negative. We did not start vancomycin. His lab work looks normal. His vital signs are good.  Objective: Vital signs in last 24 hours: Temp:  [97.4 F (36.3 C)-99.4 F (37.4 C)] 97.4 F (36.3 C) (11/19 0610) Pulse Rate:  [73-82] 73  (11/19 0610) Resp:  [18] 18  (11/19 0610) BP: (117-122)/(68-77) 117/71 mmHg (11/19 0610) SpO2:  [95 %-99 %] 98 % (11/19 0610) Last BM Date: 06/03/11  Intake/Output from previous day: 11/18 0701 - 11/19 0700 In: 3288.8 [P.O.:120; I.V.:2968.8; IV Piggyback:200] Out: 300 [Urine:300] Intake/Output this shift:    GI: abdomen is quite distended but soft. Hypoactive bowel sounds. Wounds looked fine.  Lab Results:  Results for orders placed during the hospital encounter of 05/31/11 (from the past 24 hour(s))  CBC     Status: Abnormal   Collection Time   06/04/11  5:40 AM      Component Value Range   WBC 8.7  4.0 - 10.5 (K/uL)   RBC 3.59 (*) 4.22 - 5.81 (MIL/uL)   Hemoglobin 11.3 (*) 13.0 - 17.0 (g/dL)   HCT 16.1 (*) 09.6 - 52.0 (%)   MCV 91.6  78.0 - 100.0 (fL)   MCH 31.5  26.0 - 34.0 (pg)   MCHC 34.3  30.0 - 36.0 (g/dL)   RDW 04.5  40.9 - 81.1 (%)   Platelets 162  150 - 400 (K/uL)  BASIC METABOLIC PANEL     Status: Abnormal   Collection Time   06/04/11  5:40 AM      Component Value Range   Sodium 132 (*) 135 - 145 (mEq/L)   Potassium 4.0  3.5 - 5.1 (mEq/L)   Chloride 102  96 - 112 (mEq/L)   CO2 26  19 - 32 (mEq/L)   Glucose, Bld 99  70 - 99 (mg/dL)   BUN 18  6 - 23 (mg/dL)   Creatinine, Ser 9.14  0.50 - 1.35 (mg/dL)   Calcium 8.0 (*) 8.4 - 10.5 (mg/dL)   GFR calc non Af Amer >90  >90 (mL/min)   GFR calc Af Amer >90  >90 (mL/min)     Studies/Results: @RISRSLT24 @     .  bisacodyl  10 mg Rectal Once  . heparin subcutaneous  5,000 Units Subcutaneous Q8H  . ketorolac  15 mg Intravenous Q6H  . metoCLOPramide (REGLAN) injection  10 mg Intravenous Q6H  . PHENobarbital  64.8 mg Oral QHS  . tigecycline (TYGACIL) IVPB  50 mg Intravenous Q12H     Assessment/Plan: s/p Procedure(s): APPENDECTOMY LAPAROSCOPIC status post laparoscopic appendectomy for ruptured appendicitis with the appendix adherent to the undersurface of the right inguinal hernia mesh. The mesh was debrided.  Postoperative colonic ileus seems most likely, considering radiographic and laboratory workup over the past 48 hours.  Will discontinue Imodium, discontinue all narcotic. We will allow Toradol for pain.  Continue IV support.  We'll give dulcolax suppository twice today.  We'll start IV Reglan to see if that helps.  Consider CT scanning tomorrow if the abdominal distention is no better or is worse.   LOS: 4 days    Luretta Everly M 06/04/2011    3 Days Post-Op

## 2011-06-05 ENCOUNTER — Inpatient Hospital Stay (HOSPITAL_COMMUNITY): Payer: BC Managed Care – PPO

## 2011-06-05 MED ORDER — IOHEXOL 300 MG/ML  SOLN
100.0000 mL | Freq: Once | INTRAMUSCULAR | Status: AC | PRN
  Administered 2011-06-05: 100 mL via INTRAVENOUS

## 2011-06-05 NOTE — Progress Notes (Signed)
He is stable but still distended. I am not really sure why he is distended. Hopefully the CT scan will shed some light on this.

## 2011-06-05 NOTE — Progress Notes (Signed)
4 Days Post-Op Rupture Appendicitis with abscess stuck to mesh. Subjective: Still bloated/distended.  Incisions look good, more loose stool yesterday with suppository, but this didn't relieve his distension. C/O some discomfort LLQ trocar site.  All sites look good Objective: Vital signs in last 24 hours: Temp:  [98.3 F (36.8 C)-98.9 F (37.2 C)] 98.4 F (36.9 C) (11/20 0655) Pulse Rate:  [67-73] 67  (11/20 0655) Resp:  [16-18] 16  (11/20 0655) BP: (120-125)/(69-75) 120/75 mmHg (11/20 0655) SpO2:  [95 %-97 %] 96 % (11/20 0655) Last BM Date: 06/05/11  Intake/Output from previous day: 11/19 0701 - 11/20 0700 In: 400 [P.O.:400] Out: 500 [Urine:500] Intake/Output this shift:    General appearance: alert, cooperative, fatigued and no distress Resp: clear to auscultation bilaterally GI: Abd still distended, but soft, +Bowel sounds, some flatus, not getting sick with clears. Trocar sites all look good.  Lab Results:   [facility] 06/04/11 0540 06/03/11 0525  WBC 8.7 11.1*  HGB 11.3* 12.0*  HCT 32.9* 34.0*  PLT 162 144*    BMET  Basename 06/04/11 0540 06/03/11 0525  NA 132* 126*  K 4.0 4.1  CL 102 95*  CO2 26 26  GLUCOSE 99 119*  BUN 18 16  CREATININE 0.59 0.60  CALCIUM 8.0* 8.0*   PT/INR No results found for this basename: LABPROT:2,INR:2 in the last 72 hours   Studies/Results: No results found.  Anti-infectives: Anti-infectives     Start     Dose/Rate Route Frequency Ordered Stop   06/03/11 1200   vancomycin (VANCOCIN) 50 mg/mL oral solution 500 mg  Status:  Discontinued        500 mg Oral 4 times per day 06/03/11 0907 06/03/11 1116   06/03/11 1000   metroNIDAZOLE (FLAGYL) tablet 500 mg  Status:  Discontinued        500 mg Oral 3 times per day 06/03/11 0900 06/03/11 0907   06/01/11 0947   tigecycline (TYGACIL) 50 mg in sodium chloride 0.9 % 100 mL IVPB        50 mg 200 mL/hr over 30 Minutes Intravenous Every 12 hours 05/31/11 2148     05/31/11 2200    tigecycline (TYGACIL) 100 mg in sodium chloride 0.9 % 100 mL IVPB        100 mg 200 mL/hr over 30 Minutes Intravenous  Once 05/31/11 2148 05/31/11 2256         Current Facility-Administered Medications  Medication Dose Route Frequency Provider Last Rate Last Dose  . 0.9 %  sodium chloride infusion   Intravenous Continuous Ernestene Mention, MD 125 mL/hr at 06/05/11 0400 125 mL/hr at 06/05/11 0400  . bisacodyl (DULCOLAX) suppository 10 mg  10 mg Rectal Once Ernestene Mention, MD   10 mg at 06/04/11 1915  . heparin injection 5,000 Units  5,000 Units Subcutaneous Q8H Kandis Cocking, MD   5,000 Units at 06/05/11 [phone]  . ketorolac (TORADOL) 15 MG/ML injection 15 mg  15 mg Intravenous Q6H Ernestene Mention, MD   15 mg at 06/05/11 0835  . metoCLOPramide (REGLAN) injection 10 mg  10 mg Intravenous Q6H Ernestene Mention, MD   10 mg at 06/05/11 0537  . ondansetron (ZOFRAN) tablet 4 mg  4 mg Oral Q6H PRN Kandis Cocking, MD   4 mg at 06/05/11 0750  . PHENobarbital (LUMINAL) tablet 64.8 mg  64.8 mg Oral QHS Kandis Cocking, MD   64.8 mg at 06/04/11 2321  . promethazine (PHENERGAN) injection 12.5 mg  12.5 mg Intravenous Q4H PRN Mariella Saa, MD   12.5 mg at 06/02/11 9147  . tigecycline (TYGACIL) 50 mg in sodium chloride 0.9 % 100 mL IVPB  50 mg Intravenous Q12H Kandis Cocking, MD   50 mg at 06/04/11 2319  . DISCONTD: HYDROcodone-acetaminophen (NORCO) 5-325 MG per tablet 1-2 tablet  1-2 tablet Oral Q4H PRN Kandis Cocking, MD      . DISCONTD: loperamide (IMODIUM) capsule 2 mg  2 mg Oral PRN Ernestene Mention, MD   2 mg at 06/03/11 2052  . DISCONTD: morphine 2 MG/ML injection 1-3 mg  1-3 mg Intravenous Q2H PRN Kandis Cocking, MD        Assessment/Plan POD#4 Rupture appendix, with abscess stuck to undersurface R. Inguinal mesh, mesh adjacent to abscess. Patient Active Problem List  Diagnoses  . RESTLESS LEGS SYNDROME  . SEIZURE DISORDER  . DYSPNEA  . Appendicitis with abscess   Plan: repeat CT,  continue abx.   LOS: 5 days    Keitha Kolk 06/05/2011

## 2011-06-06 DIAGNOSIS — E8770 Fluid overload, unspecified: Secondary | ICD-10-CM | POA: Diagnosis not present

## 2011-06-06 DIAGNOSIS — K567 Ileus, unspecified: Secondary | ICD-10-CM | POA: Diagnosis not present

## 2011-06-06 LAB — BASIC METABOLIC PANEL
CO2: 24 mEq/L (ref 19–32)
Calcium: 8.3 mg/dL — ABNORMAL LOW (ref 8.4–10.5)
Glucose, Bld: 103 mg/dL — ABNORMAL HIGH (ref 70–99)
Sodium: 132 mEq/L — ABNORMAL LOW (ref 135–145)

## 2011-06-06 LAB — CBC
HCT: 36.2 % — ABNORMAL LOW (ref 39.0–52.0)
Hemoglobin: 12.9 g/dL — ABNORMAL LOW (ref 13.0–17.0)
MCH: 31.8 pg (ref 26.0–34.0)
RBC: 4.06 MIL/uL — ABNORMAL LOW (ref 4.22–5.81)

## 2011-06-06 MED ORDER — HYDROCODONE-ACETAMINOPHEN 5-325 MG PO TABS: 1.0000 | ORAL_TABLET | ORAL | Status: DC | PRN

## 2011-06-06 MED ORDER — FUROSEMIDE 10 MG/ML IJ SOLN
20.0000 mg | Freq: Once | INTRAMUSCULAR | Status: AC
  Administered 2011-06-06: 20 mg via INTRAVENOUS
  Filled 2011-06-06 (×2): qty 2

## 2011-06-06 MED ORDER — ACETAMINOPHEN 325 MG PO TABS: 650.0000 mg | ORAL_TABLET | Freq: Four times a day (QID) | ORAL | Status: DC | PRN

## 2011-06-06 NOTE — Progress Notes (Signed)
Feeling better. Still distended but less so. Abdomen softer, bowel sounds present. Nontender. Suspect ileus that will slowly resolve.

## 2011-06-06 NOTE — Progress Notes (Signed)
5 Days Post-Op  Subjective: Having diarrhea, multiple stools, abd still distended and has swelling all over.  I/o 10 liters positive  Objective: Vital signs in last 24 hours: Temp:  [97.7 F (36.5 C)-98.4 F (36.9 C)] 98.4 F (36.9 C) (11/21 0600) Pulse Rate:  [61-74] 64  (11/21 0600) Resp:  [18] 18  (11/21 0600) BP: (120-149)/(70-83) 121/81 mmHg (11/21 0600) SpO2:  [96 %-98 %] 96 % (11/21 0600) Last BM Date: 06/05/11  Intake/Output from previous day: 11/20 0701 - 11/21 0700 In: 840 [P.O.:840] Out: -  Intake/Output this shift:    General appearance: alert, cooperative and no distress Resp: clear to auscultation bilaterally GI: Abd still distended, +BS,+BM Extremities: bilateral edema  Lab Results:   Basename 06/06/11 0410 06/04/11 0540  WBC 7.5 8.7  HGB 12.9* 11.3*  HCT 36.2* 32.9*  PLT 286 162    BMET  Basename 06/06/11 0410 06/04/11 0540  NA 132* 132*  K 4.0 4.0  CL 100 102  CO2 24 26  GLUCOSE 103* 99  BUN 27* 18  CREATININE 0.56 0.59  CALCIUM 8.3* 8.0*   PT/INR No results found for this basename: LABPROT:2,INR:2 in the last 72 hours   Studies/Results: Ct Abdomen Pelvis W Wo Contrast  06/05/2011  *RADIOLOGY REPORT*  Clinical Data: Appendicitis with rupture; appendectomy 4 days ago; abdominal pain  CT ABDOMEN AND PELVIS WITHOUT AND WITH CONTRAST  Technique:  Multidetector CT imaging of the abdomen and pelvis was performed without contrast material in one or both body regions, followed by contrast material(s) and further sections in one or both body regions.  Contrast: OMNIPAQUE IOHEXOL 300 MG/ML IV SOLN  Comparison: May 31, 2011  Findings: There are small bilateral pleural effusions, right greater than left. The small bowel loops are diffusely dilated; the degree of dilatation is worse proximally with the distal ileal loops returning to normal caliber. The distal ileal loops contain contrast and are not decompressed, however.  There is extra luminal  gas in the right mid - lower abdomen. A small right lower quadrant fluid collection is present adjacent to surgical sutures which measures 2.1 x 4.2 cm. This does not have the appearance of an organized abscess at this time.  There is anasarca as well as a small amount of free fluid within the abdomen and pelvis.  The liver, gallbladder, spleen, pancreas, adrenal glands have an unremarkable appearance.  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  The urinary bladder and osseous structures are unremarkable.  IMPRESSION: There is a small amount of free intraperitoneal air, free fluid, and a small right lower quadrant focal fluid collection.  Though these could all be postoperative from surgery 4 days ago, developing abscess and residual perforation cannot be excluded. Continued close follow-up is recommended.  Dilated small bowel loops proximally return to normal caliber before the distal ileal level which is worrisome for partial mechanical obstruction.  Discussed with Dr. Ezzard Standing.  Original Report Authenticated By: Brandon Melnick, M.D.    Anti-infectives: Anti-infectives     Start     Dose/Rate Route Frequency Ordered Stop   06/03/11 1200   vancomycin (VANCOCIN) 50 mg/mL oral solution 500 mg  Status:  Discontinued        500 mg Oral 4 times per day 06/03/11 0907 06/03/11 1116   06/03/11 1000   metroNIDAZOLE (FLAGYL) tablet 500 mg  Status:  Discontinued        500 mg Oral 3 times per day 06/03/11 0900 06/03/11 8119  06/01/11 0947   tigecycline (TYGACIL) 50 mg in sodium chloride 0.9 % 100 mL IVPB        50 mg 200 mL/hr over 30 Minutes Intravenous Every 12 hours 05/31/11 2148     05/31/11 2200   tigecycline (TYGACIL) 100 mg in sodium chloride 0.9 % 100 mL IVPB        100 mg 200 mL/hr over 30 Minutes Intravenous  Once 05/31/11 2148 05/31/11 2256         Current Facility-Administered Medications  Medication Dose Route Frequency Provider Last Rate Last Dose  . 0.9 %  sodium  chloride infusion   Intravenous Continuous Ernestene Mention, MD 125 mL/hr at 06/06/11 0546 125 mL/hr at 06/06/11 0546  . heparin injection 5,000 Units  5,000 Units Subcutaneous Q8H Kandis Cocking, MD   5,000 Units at 06/06/11 [phone]  . iohexol (OMNIPAQUE) 300 MG/ML injection 100 mL  100 mL Intravenous Once PRN Medication Radiologist   100 mL at 06/05/11 1335  . ketorolac (TORADOL) 15 MG/ML injection 15 mg  15 mg Intravenous Q6H Ernestene Mention, MD   15 mg at 06/06/11 0911  . metoCLOPramide (REGLAN) injection 10 mg  10 mg Intravenous Q6H Ernestene Mention, MD   10 mg at 06/06/11 0649  . ondansetron (ZOFRAN) tablet 4 mg  4 mg Oral Q6H PRN Kandis Cocking, MD   4 mg at 06/05/11 0750  . PHENobarbital (LUMINAL) tablet 64.8 mg  64.8 mg Oral QHS Kandis Cocking, MD   64.8 mg at 06/05/11 2237  . promethazine (PHENERGAN) injection 12.5 mg  12.5 mg Intravenous Q4H PRN Mariella Saa, MD   12.5 mg at 06/02/11 4403  . tigecycline (TYGACIL) 50 mg in sodium chloride 0.9 % 100 mL IVPB  50 mg Intravenous Q12H Kandis Cocking, MD   50 mg at 06/05/11 2240    Assessment: POD5 Ruptured appendix with abscess stuck to undersurface R Ing mesh; mesh adjacent to absces fluid overload ileus Patient Active Problem List  Diagnoses  . RESTLESS LEGS SYNDROME  . SEIZURE DISORDER  . DYSPNEA  . Appendicitis with abscess  . Fluid overload  . Ileus, postoperative  PLAN:  Decrease fluids, and lasix, go slow with PO's.  LOS: 6 days    Nathan [name] 06/06/2011

## 2011-06-07 LAB — CBC
MCH: 31.9 pg (ref 26.0–34.0)
MCHC: 36 g/dL (ref 30.0–36.0)
Platelets: 339 10*3/uL (ref 150–400)
RBC: 4.11 MIL/uL — ABNORMAL LOW (ref 4.22–5.81)

## 2011-06-07 LAB — COMPREHENSIVE METABOLIC PANEL
ALT: 14 U/L (ref 0–53)
AST: 21 U/L (ref 0–37)
Albumin: 2.5 g/dL — ABNORMAL LOW (ref 3.5–5.2)
CO2: 26 mEq/L (ref 19–32)
Calcium: 8.2 mg/dL — ABNORMAL LOW (ref 8.4–10.5)
Sodium: 131 mEq/L — ABNORMAL LOW (ref 135–145)
Total Protein: 4.8 g/dL — ABNORMAL LOW (ref 6.0–8.3)

## 2011-06-07 MED ORDER — KCL IN DEXTROSE-NACL 20-5-0.45 MEQ/L-%-% IV SOLN
INTRAVENOUS | Status: DC
  Administered 2011-06-08 (×2): via INTRAVENOUS
  Filled 2011-06-07 (×4): qty 1000

## 2011-06-07 MED ORDER — ONDANSETRON HCL 4 MG/2ML IJ SOLN
4.0000 mg | Freq: Four times a day (QID) | INTRAMUSCULAR | Status: DC | PRN
  Administered 2011-06-07 – 2011-06-08 (×4): 4 mg via INTRAVENOUS
  Filled 2011-06-07 (×4): qty 2

## 2011-06-07 MED ORDER — ONDANSETRON HCL 4 MG/2ML IJ SOLN
INTRAMUSCULAR | Status: AC
  Administered 2011-06-07: 4 mg
  Filled 2011-06-07: qty 2

## 2011-06-07 NOTE — Progress Notes (Addendum)
POD# 6  Assessment/Plan:   1. Appendicitis with abscess (06/01/2011) Lap appendectomy.  Note, appendix was stuck to prolene hernia mesh. Prolonged hospital course with abdominal distention, nausea, loose stools.  CT scan 06/04/2011 showed small RLQ fluid collection, small free air, but no obvious abscess.  WBC today is normal.  This is a probable ileus.  Continue current course of antibiotics, clear liquids, IVF, and ambulation.   2.  Postoperative ileus (06/06/2011)    3.  VTE prophylaxis.  On SQ heparin.  4.  To place PICC for IV access and possible to start TPN.  5.  History of seizures.  Last seizure 9 years ago.  6.  Restless leg syndrome.    LOS: 7 days   Subjective:  Patient has struggled since surgery with distention, nausea, and loose stools.  He has not walked much today.    He has had 7 loose stools  His wife is in the room.  Objective:   Filed Vitals:   06/07/11 0608  BP: 139/84  Pulse: 68  Temp: 98.5 F (36.9 C)  Resp: 18     Intake/Output from previous day:  11/21 0701 - 11/22 0700 In: 1073 [P.O.:180; I.V.:893] Out: 1953 [Urine:1950; Emesis/NG output:2; Stool:1]  Intake/Output this shift:  Total I/O In: 120 [P.O.:120] Out: -    Physical Exam:   General: Thin WN WM who is alert.  He does not look sick.    HEENT: Normal. Pupils equal. Good dentition. .   Lungs: Clear.  Good inspiratory effort.   Abdomen: Mild distention.  Few BS.  No localized tenderness.   Wound: Okay.  No redness or concern.   Neurologic:  Grossly intact to motor and sensory function.   Psychiatric: Has normal mood and affect. He's a little depressed about staying around in the hospital.   Lab Results:    [facility] 06/07/11 0450 06/06/11 0410  WBC 8.0 7.5  HGB 13.1 12.9*  HCT 36.4* 36.2*  PLT 339 286    BMET   Basename 06/07/11 0450 06/06/11 0410  NA 131* 132*  K 4.3 4.0  CL 98 100  CO2 26 24  GLUCOSE 104* 103*  BUN 27* 27*  CREATININE 0.62 0.56  CALCIUM 8.2* 8.3*     PT/INR  No results found for this basename: LABPROT:2,INR:2 in the last 72 hours  ABG  No results found for this basename: PHART:2,PCO2:2,PO2:2,HCO3:2 in the last 72 hours   Studies/Results:  No results found.   Anti-infectives:   Anti-infectives     Start     Dose/Rate Route Frequency Ordered Stop   06/03/11 1200   vancomycin (VANCOCIN) 50 mg/mL oral solution 500 mg  Status:  Discontinued        500 mg Oral 4 times per day 06/03/11 0907 06/03/11 1116   06/03/11 1000   metroNIDAZOLE (FLAGYL) tablet 500 mg  Status:  Discontinued        500 mg Oral 3 times per day 06/03/11 0900 06/03/11 0907   06/01/11 0947   tigecycline (TYGACIL) 50 mg in sodium chloride 0.9 % 100 mL IVPB        50 mg 200 mL/hr over 30 Minutes Intravenous Every 12 hours 05/31/11 2148     05/31/11 2200   tigecycline (TYGACIL) 100 mg in sodium chloride 0.9 % 100 mL IVPB        100 mg 200 mL/hr over 30 Minutes Intravenous  Once 05/31/11 2148 05/31/11 2256         Onalee Hua  Ezzard Standing, MD, FACS Pager: [phone],   [facility] Office: [phone] 06/07/2011

## 2011-06-07 NOTE — Progress Notes (Signed)
Pt threw up again at 03:15. Emesis was green. Asked patient if he wanted to take the PRN IV phenergan and he refused. He stated that the IV phenergan makes him feel like he is going to "jump out of the bed" and makes him very restless and "out of it".  Called MD to see if PO Zofran could get changed to IV. Received the order to change from Dr Daphine Deutscher. Gave pt 4mg  IV Zofran at 03:37. Will continue to monitor.  Samara Snide [facility] 06/07/2011 4:00 AM

## 2011-06-07 NOTE — Progress Notes (Signed)
Pt got up to go to the bedside commode at 01:30 and threw up on the floor. The emesis was green. Had given the patient zofran 4mg  PO at 22:38, and the patient did not want to take the PRN 12.5 mg phenergan IV. Will continue to monitor.  Samara Snide [facility] 06/07/2011 1:37 AM

## 2011-06-08 ENCOUNTER — Inpatient Hospital Stay (HOSPITAL_COMMUNITY): Payer: BC Managed Care – PPO

## 2011-06-08 LAB — DIFFERENTIAL
Eosinophils Absolute: 0 10*3/uL (ref 0.0–0.7)
Eosinophils Relative: 0 % (ref 0–5)
Lymphs Abs: 0.6 10*3/uL — ABNORMAL LOW (ref 0.7–4.0)
Monocytes Relative: 13 % — ABNORMAL HIGH (ref 3–12)

## 2011-06-08 LAB — CBC
HCT: 37.3 % — ABNORMAL LOW (ref 39.0–52.0)
Hemoglobin: 13 g/dL (ref 13.0–17.0)
MCH: 31.4 pg (ref 26.0–34.0)
MCV: 90.1 fL (ref 78.0–100.0)
RBC: 4.14 MIL/uL — ABNORMAL LOW (ref 4.22–5.81)

## 2011-06-08 LAB — BASIC METABOLIC PANEL
BUN: 22 mg/dL (ref 6–23)
CO2: 25 mEq/L (ref 19–32)
Calcium: 7.7 mg/dL — ABNORMAL LOW (ref 8.4–10.5)
GFR calc non Af Amer: >90 mL/min (ref >90)
Glucose, Bld: 120 mg/dL — ABNORMAL HIGH (ref 70–99)

## 2011-06-08 LAB — CULTURE, BLOOD (ROUTINE X 2): Culture  Setup Time: 201211171715

## 2011-06-08 MED ORDER — ONDANSETRON HCL 4 MG/2ML IJ SOLN
4.0000 mg | Freq: Four times a day (QID) | INTRAMUSCULAR | Status: DC
  Administered 2011-06-11 – 2011-06-13 (×3): 4 mg via INTRAVENOUS
  Filled 2011-06-08 (×5): qty 2

## 2011-06-08 MED ORDER — FUROSEMIDE 10 MG/ML IJ SOLN
40.0000 mg | Freq: Once | INTRAMUSCULAR | Status: AC
  Administered 2011-06-08: 40 mg via INTRAVENOUS
  Filled 2011-06-08: qty 4

## 2011-06-08 MED ORDER — DIPHENHYDRAMINE HCL 25 MG PO CAPS: 25.0000 mg | ORAL_CAPSULE | Freq: Four times a day (QID) | ORAL | Status: DC | PRN

## 2011-06-08 MED ORDER — LIP MEDEX EX OINT
1.0000 "application " | TOPICAL_OINTMENT | Freq: Two times a day (BID) | CUTANEOUS | Status: DC
  Administered 2011-06-09 – 2011-06-17 (×14): 1 via TOPICAL
  Filled 2011-06-08 (×3): qty 7

## 2011-06-08 MED ORDER — METOCLOPRAMIDE HCL 5 MG/ML IJ SOLN: 5.0000 mg | Freq: Four times a day (QID) | INTRAMUSCULAR | Status: DC | PRN

## 2011-06-08 MED ORDER — ALUM & MAG HYDROXIDE-SIMETH 400-400-40 MG/5ML PO SUSP
30.0000 mL | Freq: Four times a day (QID) | ORAL | Status: DC | PRN
  Filled 2011-06-08: qty 30

## 2011-06-08 MED ORDER — FLORA-Q PO CAPS
1.0000 | ORAL_CAPSULE | Freq: Every day | ORAL | Status: DC
  Administered 2011-06-08 – 2011-06-18 (×10): 1 via ORAL
  Filled 2011-06-08 (×13): qty 1

## 2011-06-08 MED ORDER — PSYLLIUM 95 % PO PACK
1.0000 | PACK | Freq: Two times a day (BID) | ORAL | Status: DC
  Administered 2011-06-08 – 2011-06-09 (×2): 1 via ORAL
  Filled 2011-06-08 (×5): qty 1

## 2011-06-08 MED ORDER — OXYCODONE HCL 5 MG PO TABS: 5.0000 mg | ORAL_TABLET | ORAL | Status: DC | PRN

## 2011-06-08 MED ORDER — HYDROMORPHONE BOLUS VIA INFUSION
0.5000 mg | INTRAVENOUS | Status: DC | PRN
  Filled 2011-06-08: qty 200

## 2011-06-08 MED ORDER — SODIUM CHLORIDE 0.9 % IJ SOLN
10.0000 mL | INTRAMUSCULAR | Status: DC | PRN
  Administered 2011-06-12 – 2011-06-17 (×4): 10 mL

## 2011-06-08 MED ORDER — DIPHENHYDRAMINE HCL 50 MG/ML IJ SOLN: 12.5000 mg | Freq: Four times a day (QID) | INTRAMUSCULAR | Status: DC | PRN

## 2011-06-08 MED ORDER — TRACE MINERALS CR-CU-MN-SE-ZN 10-1000-500-60 MCG/ML IV SOLN
INTRAVENOUS | Status: AC
  Administered 2011-06-08: 22:00:00 via INTRAVENOUS
  Filled 2011-06-08: qty 1000

## 2011-06-08 MED ORDER — ACETAMINOPHEN 325 MG PO TABS
650.0000 mg | ORAL_TABLET | Freq: Four times a day (QID) | ORAL | Status: DC
  Administered 2011-06-08: 650 mg via ORAL
  Filled 2011-06-08 (×45): qty 2

## 2011-06-08 MED ORDER — KCL IN DEXTROSE-NACL 20-5-0.45 MEQ/L-%-% IV SOLN
INTRAVENOUS | Status: DC
  Administered 2011-06-09: 11:00:00 via INTRAVENOUS
  Filled 2011-06-08 (×4): qty 1000

## 2011-06-08 MED ORDER — FAT EMULSION 20 % IV EMUL
250.0000 mL | INTRAVENOUS | Status: AC
  Administered 2011-06-08: 250 mL via INTRAVENOUS
  Filled 2011-06-08: qty 250

## 2011-06-08 MED ORDER — BISACODYL 10 MG RE SUPP: 10.0000 mg | Freq: Two times a day (BID) | RECTAL | Status: DC | PRN

## 2011-06-08 NOTE — Progress Notes (Signed)
INITIAL ADULT NUTRITION ASSESSMENT Date: 06/08/2011   Time: 2:43 PM Reason for Assessment: TPN  ASSESSMENT: Male 65 y.o.  Dx: Appendicitis with abscess  Hx:  Past Medical History  Diagnosis Date  . DYSPNEA 02/09/2009  . RESTLESS LEGS SYNDROME 02/28/2009  . SEIZURE DISORDER 02/09/2009   Past Surgical History  Procedure Date  . Laparoscopic appendectomy 06/01/2011    Procedure: APPENDECTOMY LAPAROSCOPIC;  Surgeon: Kandis Cocking, MD;  Location: [facility]S;  Service: General;  Laterality: N/A;   Related Meds:  Scheduled Meds:   . acetaminophen  650 mg Oral QID  . Flora-Q  1 capsule Oral Daily  . furosemide  40 mg Intravenous Once  . heparin subcutaneous  5,000 Units Subcutaneous Q8H  . lip balm  1 application Topical BID  . ondansetron  4 mg Intravenous Q6H  . PHENobarbital  64.8 mg Oral QHS  . psyllium  1 packet Oral BID  . tigecycline (TYGACIL) IVPB  50 mg Intravenous Q12H   Continuous Infusions:   . dextrose 5 % and 0.45 % NaCl with KCl 20 mEq/L    . fat emulsion    . TPN (CLINIMIX) +/- additives    . DISCONTD: sodium chloride 75 mL/hr at 06/07/11 1900  . DISCONTD: dextrose 5 % and 0.45 % NaCl with KCl 20 mEq/L 100 mL/hr at 06/08/11 1041   PRN Meds:.alum & mag hydroxide-simeth, bisacodyl, diphenhydrAMINE, diphenhydrAMINE, HYDROmorphone, metoCLOPramide (REGLAN) injection, promethazine, DISCONTD: acetaminophen, DISCONTD: HYDROcodone-acetaminophen, DISCONTD: ondansetron, DISCONTD: oxyCODONE   Ht: 6' (182.9 cm)  Wt: 150 lb (68.04 kg) (per patient )  Ideal Wt: 80 kg % Ideal Wt: 85%  Usual Wt: unknown  % Usual Wt:   Body mass index is 20.34 kg/(m^2).  Food/Nutrition Related Hx: Pt admitted with appendicitis with abscess.  Pt has slowly progressed but remain NPO.  Pt has been NPO for 8 days.  Pt was eating per his usual prior to acute onset of symptoms.  No significant hx PTA except for seizure disorder.  Pt is thin but not severely underweight.  Labs:  CMP       Component Value Date/Time   NA 134* 06/08/2011 0550   K 4.0 06/08/2011 0550   CL 102 06/08/2011 0550   CO2 25 06/08/2011 0550   GLUCOSE 120* 06/08/2011 0550   BUN 22 06/08/2011 0550   CREATININE 0.52 06/08/2011 0550   CALCIUM 7.7* 06/08/2011 0550   PROT 4.8* 06/07/2011 0450   ALBUMIN 2.5* 06/07/2011 0450   AST 21 06/07/2011 0450   ALT 14 06/07/2011 0450   ALKPHOS 78 06/07/2011 0450   BILITOT 0.6 06/07/2011 0450   GFRNONAA >90 06/08/2011 0550   GFRAA >90 06/08/2011 0550   Intake: NPO Output:   Intake/Output Summary (Last 24 hours) at 06/08/11 1448 Last data filed at 06/08/11 1100  Gross per 24 hour  Intake   2300 ml  Output    405 ml  Net   1895 ml   Diarrhea has improved.  1 BM daily.  Diet Order: Clear Liquid  Supplements/Tube Feeding: none  IVF:    dextrose 5 % and 0.45 % NaCl with KCl 20 mEq/L   fat emulsion   TPN (CLINIMIX) +/- additives   DISCONTD: sodium chloride Last Rate: 75 mL/hr at 06/07/11 1900  DISCONTD: dextrose 5 % and 0.45 % NaCl with KCl 20 mEq/L Last Rate: 100 mL/hr at 06/08/11 1041    Estimated Nutritional Needs:   Kcal:1910-2180 kcal Protein:81-95 g Fluid:~2.0 L/day  NUTRITION DIAGNOSIS: -Inadequate oral intake (  NI-2.1).  Status: Ongoing  RELATED TO: appendicitis with abscess  AS EVIDENCE BY: pt NPO, clear liquids for 8 days, unable to advance diet  MONITORING/EVALUATION(Goals): 1.  Parenteral nutrition; initiation with tolerance, to meet as close to 100% of need as able with pre-mixed solution.  EDUCATION NEEDS: -No education needs identified at this time  INTERVENTION: 1.  Parenteral nutrition; Pt to start Clinimix 5/15 at 40 mL/hr with 20% IV lipids at 10 mL/hr providing 1161 kcal, 48g protein. Goal rate of 60 mL/hr with lipid would provide 1502 kcal, 72 g protein meeting 78% estimated kcal needs, 88% estimated protein needs.  Consider increasing goal rate. 2.  Food/Beverage; diet advancement per MD discretion based on pt  tolerance.  Dietitian #: [phone]  DOCUMENTATION CODES Per approved criteria  -Underweight    Nathan [name] 06/08/2011, 2:43 PM

## 2011-06-08 NOTE — Progress Notes (Signed)
7 Days Post-Op  Subjective: Abdomen still distended.  Had nausea with vomiting on Wed. PM, took phenegren q6h yesterday, so he wouldn't vomit again.  Abd is still distended, still has lower extremity swelling, and now scrotal swelling.  PICC ordered for today, and we will consider TNA. Still on Tigercycline.  Objective: Vital signs in last 24 hours: Temp:  [97.2 F (36.2 C)-98 F (36.7 C)] 97.8 F (36.6 C) (11/23 0500) Pulse Rate:  [67-70] 70  (11/23 0500) Resp:  [18] 18  (11/23 0500) BP: (132-150)/(76-87) 149/87 mmHg (11/23 0500) SpO2:  [95 %-97 %] 95 % (11/23 0500) Last BM Date: 06/08/11  Intake/Output from previous day: 11/22 0701 - 11/23 0700 In: 2760 [P.O.:360; I.V.:2200; IV Piggyback:200] Out: 5 [Urine:4; Stool:1] Intake/Output this shift:    General appearance: alert, appears stated age, fatigued and with ongoing swelling. Resp: few rales at bases GI: Still distended, not really tender, c/o some RUQ pain yesterday.Marland Kitchen  He's not having it now. He has scrotal and LE swelling, edema is +1-2.  Lab Results:   [facility] 06/08/11 0550 06/07/11 0450  WBC 7.0 8.0  HGB 13.0 13.1  HCT 37.3* 36.4*  PLT 372 339    BMET  Basename 06/08/11 0550 06/07/11 0450  NA 134* 131*  K 4.0 4.3  CL 102 98  CO2 25 26  GLUCOSE 120* 104*  BUN 22 27*  CREATININE 0.52 0.62  CALCIUM 7.7* 8.2*   PT/INR No results found for this basename: LABPROT:2,INR:2 in the last 72 hours   Studies/Results: No results found.  Anti-infectives: Anti-infectives     Start     Dose/Rate Route Frequency Ordered Stop   06/03/11 1200   vancomycin (VANCOCIN) 50 mg/mL oral solution 500 mg  Status:  Discontinued        500 mg Oral 4 times per day 06/03/11 0907 06/03/11 1116   06/03/11 1000   metroNIDAZOLE (FLAGYL) tablet 500 mg  Status:  Discontinued        500 mg Oral 3 times per day 06/03/11 0900 06/03/11 0907   06/01/11 0947   tigecycline (TYGACIL) 50 mg in sodium chloride 0.9 % 100 mL IVPB        50  mg 200 mL/hr over 30 Minutes Intravenous Every 12 hours 05/31/11 2148     05/31/11 2200   tigecycline (TYGACIL) 100 mg in sodium chloride 0.9 % 100 mL IVPB        100 mg 200 mL/hr over 30 Minutes Intravenous  Once 05/31/11 2148 05/31/11 2256         Current Facility-Administered Medications  Medication Dose Route Frequency Provider Last Rate Last Dose  . acetaminophen (TYLENOL) tablet 650 mg  650 mg Oral Q6H PRN Sherrie George, PA      . dextrose 5 % and 0.45 % NaCl with KCl 20 mEq/L infusion   Intravenous Continuous Kandis Cocking, MD 100 mL/hr at 06/08/11 0010    . heparin injection 5,000 Units  5,000 Units Subcutaneous Q8H Kandis Cocking, MD   5,000 Units at 06/08/11 0531  . HYDROcodone-acetaminophen (NORCO) 5-325 MG per tablet 1-2 tablet  1-2 tablet Oral Q4H PRN Sherrie George, PA      . ondansetron [facility]) injection 4 mg  4 mg Intravenous Q6H PRN Valarie Merino, MD   4 mg at 06/08/11 0451  . PHENobarbital (LUMINAL) tablet 64.8 mg  64.8 mg Oral QHS Kandis Cocking, MD   64.8 mg at 06/07/11 2240  . promethazine (PHENERGAN) injection 12.5  mg  12.5 mg Intravenous Q4H PRN Mariella Saa, MD   12.5 mg at 06/02/11 1610  . tigecycline (TYGACIL) 50 mg in sodium chloride 0.9 % 100 mL IVPB  50 mg Intravenous Q12H Kandis Cocking, MD   50 mg at 06/07/11 2237  . DISCONTD: 0.9 %  sodium chloride infusion   Intravenous Continuous Sherrie George, PA 75 mL/hr at 06/07/11 1900      Assessment/Plan  POD 7 S/P Appendectomy with post op ileus.(Ruptured with appendix stuck to undersurface of the R ing mesh. Post op fluid overload, ? Low albumin.  Patient Active Problem List  Diagnoses  . RESTLESS LEGS SYNDROME  . SEIZURE DISORDER  . DYSPNEA  . Appendicitis with abscess  . Fluid overload  . Ileus, postoperative  PLAN: 2View abd, still on clears, still on abx, ? Start TNA.  LOS: 8 days    Nathan [name] 06/08/2011

## 2011-06-08 NOTE — Progress Notes (Signed)
-  persistent ileus -start TNA -CT if not clinically improved

## 2011-06-08 NOTE — Progress Notes (Signed)
PARENTERAL NUTRITION CONSULT NOTE - INITIAL  Pharmacy Consult for TNA Indication: Post-op ileus/ruptured appendix 11/16  Allergies  Allergen Reactions  . Carbamazepine     REACTION: fatigue, sleepy  . Metronidazole     REACTION: GI upset  . Penicillins     REACTION: childhood, hives?  . Phenytoin     REACTION: rash, leg swelling    Patient Measurements: Height: 6' (182.9 cm) Weight: 150 lb (68.04 kg) (per patient ) IBW/kg (Calculated) : 77.6  Adjusted Body Weight:  Usual Weight: 68kg  Vital Signs: Temp: 97.8 F (36.6 C) (11/23 0500) Temp src: Oral (11/23 0500) BP: 149/87 mmHg (11/23 0500) Pulse Rate: 70  (11/23 0500) Intake/Output from previous day: 11/22 0701 - 11/23 0700 In: 2760 [P.O.:360; I.V.:2200; IV Piggyback:200] Out: 5 [Urine:4; Stool:1] Intake/Output from this shift:    Labs:  [facility] 06/08/11 0550 06/07/11 0450 06/06/11 0410  WBC 7.0 8.0 7.5  HGB 13.0 13.1 12.9*  HCT 37.3* 36.4* 36.2*  PLT 372 339 286  APTT -- -- --  INR -- -- --     Basename 06/08/11 0550 06/07/11 0450 06/06/11 0410  NA 134* 131* 132*  K 4.0 4.3 4.0  CL 102 98 100  CO2 25 26 24   GLUCOSE 120* 104* 103*  BUN 22 27* 27*  CREATININE 0.52 0.62 0.56  LABCREA -- -- --  CREAT24HRUR -- -- --  CALCIUM 7.7* 8.2* 8.3*  MG -- 1.8 --  PHOS -- -- --  PROT -- 4.8* --  ALBUMIN -- 2.5* --  AST -- 21 --  ALT -- 14 --  ALKPHOS -- 78 --  BILITOT -- 0.6 --  BILIDIR -- -- --  IBILI -- -- --  PREALBUMIN -- -- --  CHOLHDL -- -- --  CHOL -- -- --   Estimated Creatinine Clearance: 89.7 ml/min (by C-G formula based on Cr of 0.52).   No results found for this basename: GLUCAP:3 in the last 72 hours  Medical History: Past Medical History  Diagnosis Date  . DYSPNEA 02/09/2009  . RESTLESS LEGS SYNDROME 02/28/2009  . SEIZURE DISORDER 02/09/2009    Medications:  Scheduled:    . heparin subcutaneous  5,000 Units Subcutaneous Q8H  . PHENobarbital  64.8 mg Oral QHS  . tigecycline  (TYGACIL) IVPB  50 mg Intravenous Q12H   Infusions:    . dextrose 5 % and 0.45 % NaCl with KCl 20 mEq/L 100 mL/hr at 06/08/11 1041  . DISCONTD: sodium chloride 75 mL/hr at 06/07/11 1900   PRN: acetaminophen, HYDROcodone-acetaminophen, ondansetron, promethazine Anti-infectives     Start     Dose/Rate Route Frequency Ordered Stop   06/03/11 1200   vancomycin (VANCOCIN) 50 mg/mL oral solution 500 mg  Status:  Discontinued        500 mg Oral 4 times per day 06/03/11 0907 06/03/11 1116   06/03/11 1000   metroNIDAZOLE (FLAGYL) tablet 500 mg  Status:  Discontinued        500 mg Oral 3 times per day 06/03/11 0900 06/03/11 0907   06/01/11 0947   tigecycline (TYGACIL) 50 mg in sodium chloride 0.9 % 100 mL IVPB        50 mg 200 mL/hr over 30 Minutes Intravenous Every 12 hours 05/31/11 2148     05/31/11 2200   tigecycline (TYGACIL) 100 mg in sodium chloride 0.9 % 100 mL IVPB        100 mg 200 mL/hr over 30 Minutes Intravenous  Once 05/31/11 2148 05/31/11 2256  Insulin Requirements in the past 24 hours:  No insulin ordered  Current Nutrition:  Clear liquid diet since 11/18    Assessment: 65 yo WM s/p appendectomy 11/16, ruptured, continues on Tygacillin Plan PICC line today, begin TNA  Nutritional Goals:  1700 kCal, 85 grams of protein per day at goal rate 83 ml/hr  Plan:  Begin TNA at 38ml/hr using E 5/15 formula, check labs/CBG's Determine need for adjunct insulin if CBG's elevated Reduce IV rate to 68ml/hr Advance TNA as tolerated/monitor lytes, CBG's  Dalesha Stanback L 06/08/2011,10:51 AM

## 2011-06-09 DIAGNOSIS — E46 Unspecified protein-calorie malnutrition: Secondary | ICD-10-CM

## 2011-06-09 DIAGNOSIS — E876 Hypokalemia: Secondary | ICD-10-CM

## 2011-06-09 DIAGNOSIS — E8779 Other fluid overload: Secondary | ICD-10-CM

## 2011-06-09 LAB — COMPREHENSIVE METABOLIC PANEL
AST: 21 U/L (ref 0–37)
Albumin: 2.3 g/dL — ABNORMAL LOW (ref 3.5–5.2)
BUN: 18 mg/dL (ref 6–23)
BUN: 15 mg/dL (ref 6–23)
CO2: 26 mEq/L (ref 19–32)
Calcium: 7.6 mg/dL — ABNORMAL LOW (ref 8.4–10.5)
Chloride: 97 mEq/L (ref 96–112)
Creatinine, Ser: 0.46 mg/dL — ABNORMAL LOW (ref 0.50–1.35)
Creatinine, Ser: 0.43 mg/dL — ABNORMAL LOW (ref 0.50–1.35)
GFR calc Af Amer: >90 mL/min (ref >90)
GFR calc non Af Amer: >90 mL/min (ref >90)
Total Bilirubin: 0.4 mg/dL (ref 0.3–1.2)
Total Bilirubin: 0.4 mg/dL (ref 0.3–1.2)
Total Protein: 4.4 g/dL — ABNORMAL LOW (ref 6.0–8.3)

## 2011-06-09 LAB — MAGNESIUM
Magnesium: 1.7 mg/dL (ref 1.5–2.5)
Magnesium: 1.7 mg/dL (ref 1.5–2.5)

## 2011-06-09 LAB — DIFFERENTIAL
Basophils Absolute: 0 10*3/uL (ref 0.0–0.1)
Basophils Relative: 0 % (ref 0–1)
Eosinophils Absolute: 0 10*3/uL (ref 0.0–0.7)
Eosinophils Relative: 0 % (ref 0–5)
Monocytes Absolute: 1 10*3/uL (ref 0.1–1.0)
Neutro Abs: 8.4 10*3/uL — ABNORMAL HIGH (ref 1.7–7.7)

## 2011-06-09 LAB — TRIGLYCERIDES: Triglycerides: 35 mg/dL (ref <150)

## 2011-06-09 LAB — CBC
HCT: 35.2 % — ABNORMAL LOW (ref 39.0–52.0)
HCT: 35.8 % — ABNORMAL LOW (ref 39.0–52.0)
MCH: 32.3 pg (ref 26.0–34.0)
MCHC: 35.2 g/dL (ref 30.0–36.0)
MCV: 88.9 fL (ref 78.0–100.0)
Platelets: 371 10*3/uL (ref 150–400)
RBC: 3.96 MIL/uL — ABNORMAL LOW (ref 4.22–5.81)
RDW: 12.8 % (ref 11.5–15.5)
RDW: 12.6 % (ref 11.5–15.5)

## 2011-06-09 LAB — PREALBUMIN: Prealbumin: 4 mg/dL — ABNORMAL LOW (ref 17.0–34.0)

## 2011-06-09 MED ORDER — VITAMIN C 500 MG PO TABS
500.0000 mg | ORAL_TABLET | Freq: Every day | ORAL | Status: DC
  Administered 2011-06-09 – 2011-06-18 (×8): 500 mg via ORAL
  Filled 2011-06-09 (×10): qty 1

## 2011-06-09 MED ORDER — FAT EMULSION 20 % IV EMUL
250.0000 mL | INTRAVENOUS | Status: AC
  Administered 2011-06-09: 250 mL via INTRAVENOUS
  Filled 2011-06-09: qty 250

## 2011-06-09 MED ORDER — PSYLLIUM 95 % PO PACK
1.0000 | PACK | Freq: Two times a day (BID) | ORAL | Status: DC
  Administered 2011-06-10: 1 via ORAL
  Filled 2011-06-09 (×4): qty 1

## 2011-06-09 MED ORDER — CLINIMIX E/DEXTROSE (5/15) 5 % IV SOLN
INTRAVENOUS | Status: AC
  Administered 2011-06-09: 18:00:00 via INTRAVENOUS
  Filled 2011-06-09: qty 2000

## 2011-06-09 MED ORDER — GUAIFENESIN-DM 100-10 MG/5ML PO SYRP
15.0000 mL | ORAL_SOLUTION | ORAL | Status: DC | PRN
Start: 2011-06-09 — End: 2011-06-18

## 2011-06-09 MED ORDER — ZOLPIDEM TARTRATE 5 MG PO TABS: 5.0000 mg | ORAL_TABLET | Freq: Every evening | ORAL | Status: DC | PRN

## 2011-06-09 MED ORDER — POTASSIUM CHLORIDE CRYS ER 20 MEQ PO TBCR
40.0000 meq | EXTENDED_RELEASE_TABLET | Freq: Every day | ORAL | Status: DC
  Administered 2011-06-09 – 2011-06-10 (×2): 40 meq via ORAL
  Filled 2011-06-09 (×10): qty 2

## 2011-06-09 MED ORDER — SODIUM CHLORIDE 0.9 % IV SOLN
INTRAVENOUS | Status: DC
  Filled 2011-06-09 (×3): qty 1000

## 2011-06-09 MED ORDER — FUROSEMIDE 40 MG PO TABS
40.0000 mg | ORAL_TABLET | Freq: Two times a day (BID) | ORAL | Status: DC
  Administered 2011-06-09 – 2011-06-13 (×5): 40 mg via ORAL
  Filled 2011-06-09 (×9): qty 1

## 2011-06-09 NOTE — Progress Notes (Signed)
Pt refusing his scheduled zofran and tylenol.  Feels that he does not need them.

## 2011-06-09 NOTE — Progress Notes (Signed)
PCP: Kristian Covey, MD  Outpatient Care Team: Patient Care Team: Kristian Covey as PCP - General  Inpatient Treatment Team: Treatment Team: Attending Provider: Md Ccs; Consulting Physician: Md Ccs; Registered Nurse: Netty Starring, RN; Registered Nurse: Romeo Rabon, RN; Registered Nurse: Pervis Hocking, RN; Registered Nurse: Mliss Fritz, RN; Respiratory Therapist: Terie Purser Closson, RRT; Technician: Corwin Levins; Registered Nurse: Virgel Paling Greenfield, RN   LOS: 9 days   8 Days Post-Op  Procedure(s): APPENDECTOMY LAPAROSCOPIC  Subjective:  Feeling a little better Not hungry but no N/V Walked 14 laps in hallways yesterday Wife in room  Objective:  Vital signs:  Temp:  [98 F (36.7 C)-98.7 F (37.1 C)] 98.7 F (37.1 C) (11/24 0607) Pulse Rate:  [64-70] 67  (11/24 0607) Resp:  [16-18] 16  (11/24 0607) BP: (128-132)/(74-81) 128/81 mmHg (11/24 0607) SpO2:  [93 %-99 %] 97 % (11/24 0607) Last BM Date: 06/08/11  Intake/Output    from previous day: 11/23 0701 - 11/24 0700 In: 1590 [P.O.:120; I.V.:920; IV Piggyback:200; TPN:350] Out: 3100 [Urine:3100]  this shift:    Flatus:Mod volume / Increased BM: few liquid  Physical Exam:  General: Pt awake/alert/oriented x4 in no acute distress Eyes: PERRL, normal EOM.  Sclera clear.  No icterus Neuro: CN II-XII intact w/o focal sensory/motor deficits. Lymph: No head/neck/groin lymphadenopathy Psych:  No delerium/psychosis/paranoia.  Calm HENT: Normocephalic, Mucus membranes moist.  No thrush Neck: Supple, No tracheal deviation Chest: Clear No chest wall pain w good excursion CV:  Pulses intact.  Regular rhythm Abdomen: Soft, Nontender/ Mod distended.  Very edematous.  Incisions c/d/i/  No incarcerated hernias. Ext:  SCDs BLE.  3+ anasarca infraumb / scrotal/ BLE edema.  No cyanosis Skin: No petechiae / purpurae  Results:   Labs: Results for orders placed during the hospital  encounter of 05/31/11 (from the past 48 hour(s))  CBC     Status: Abnormal   Collection Time   06/08/11  5:50 AM      Component Value Range Comment   WBC 7.0  4.0 - 10.5 (K/uL)    RBC 4.14 (*) 4.22 - 5.81 (MIL/uL)    Hemoglobin 13.0  13.0 - 17.0 (g/dL)    HCT 25.3 (*) 66.4 - 52.0 (%)    MCV 90.1  78.0 - 100.0 (fL)    MCH 31.4  26.0 - 34.0 (pg)    MCHC 34.9  30.0 - 36.0 (g/dL)    RDW 40.3  47.4 - 25.9 (%)    Platelets 372  150 - 400 (K/uL)   DIFFERENTIAL     Status: Abnormal   Collection Time   06/08/11  5:50 AM      Component Value Range Comment   Neutrophils Relative 77  43 - 77 (%)    Neutro Abs 5.4  1.7 - 7.7 (K/uL)    Lymphocytes Relative 9 (*) 12 - 46 (%)    Lymphs Abs 0.6 (*) 0.7 - 4.0 (K/uL)    Monocytes Relative 13 (*) 3 - 12 (%)    Monocytes Absolute 0.9  0.1 - 1.0 (K/uL)    Eosinophils Relative 0  0 - 5 (%)    Eosinophils Absolute 0.0  0.0 - 0.7 (K/uL)    Basophils Relative 0  0 - 1 (%)    Basophils Absolute 0.0  0.0 - 0.1 (K/uL)   BASIC METABOLIC PANEL     Status: Abnormal   Collection Time   06/08/11  5:50 AM  Component Value Range Comment   Sodium 134 (*) 135 - 145 (mEq/L)    Potassium 4.0  3.5 - 5.1 (mEq/L)    Chloride 102  96 - 112 (mEq/L)    CO2 25  19 - 32 (mEq/L)    Glucose, Bld 120 (*) 70 - 99 (mg/dL)    BUN 22  6 - 23 (mg/dL)    Creatinine, Ser 1.61  0.50 - 1.35 (mg/dL)    Calcium 7.7 (*) 8.4 - 10.5 (mg/dL)    GFR calc non Af Amer >90  >90 (mL/min)    GFR calc Af Amer >90  >90 (mL/min)   PREALBUMIN     Status: Abnormal   Collection Time   06/08/11  6:00 AM      Component Value Range Comment   Prealbumin 3.6 (*) 17.0 - 34.0 (mg/dL)   CBC     Status: Abnormal   Collection Time   06/09/11  6:30 AM      Component Value Range Comment   WBC 8.4  4.0 - 10.5 (K/uL)    RBC 3.96 (*) 4.22 - 5.81 (MIL/uL)    Hemoglobin 12.8 (*) 13.0 - 17.0 (g/dL)    HCT 09.6 (*) 04.5 - 52.0 (%)    MCV 88.9  78.0 - 100.0 (fL)    MCH 32.3  26.0 - 34.0 (pg)    MCHC  36.4 (*) 30.0 - 36.0 (g/dL)    RDW 40.9  81.1 - 91.4 (%)    Platelets 371  150 - 400 (K/uL)   COMPREHENSIVE METABOLIC PANEL     Status: Abnormal   Collection Time   06/09/11  6:30 AM      Component Value Range Comment   Sodium 129 (*) 135 - 145 (mEq/L)    Potassium 3.7  3.5 - 5.1 (mEq/L)    Chloride 98  96 - 112 (mEq/L)    CO2 26  19 - 32 (mEq/L)    Glucose, Bld 120 (*) 70 - 99 (mg/dL)    BUN 18  6 - 23 (mg/dL)    Creatinine, Ser 7.82 (*) 0.50 - 1.35 (mg/dL)    Calcium 7.6 (*) 8.4 - 10.5 (mg/dL)    Total Protein 4.5 (*) 6.0 - 8.3 (g/dL)    Albumin 2.3 (*) 3.5 - 5.2 (g/dL)    AST 21  0 - 37 (U/L)    ALT 13  0 - 53 (U/L)    Alkaline Phosphatase 74  39 - 117 (U/L)    Total Bilirubin 0.4  0.3 - 1.2 (mg/dL)    GFR calc non Af Amer >90  >90 (mL/min)    GFR calc Af Amer >90  >90 (mL/min)   MAGNESIUM     Status: Normal   Collection Time   06/09/11  6:30 AM      Component Value Range Comment   Magnesium 1.7  1.5 - 2.5 (mg/dL)     Imaging / Studies: @[id number] @  Antibiotics: Anti-infectives     Start     Dose/Rate Route Frequency Ordered Stop   06/03/11 1200   vancomycin (VANCOCIN) 50 mg/mL oral solution 500 mg  Status:  Discontinued        500 mg Oral 4 times per day 06/03/11 0907 06/03/11 1116   06/03/11 1000   metroNIDAZOLE (FLAGYL) tablet 500 mg  Status:  Discontinued        500 mg Oral 3 times per day 06/03/11 0900 06/03/11 0907   06/01/11 0947   tigecycline (TYGACIL) 50  mg in sodium chloride 0.9 % 100 mL IVPB        50 mg 200 mL/hr over 30 Minutes Intravenous Every 12 hours 05/31/11 2148     05/31/11 2200   tigecycline (TYGACIL) 100 mg in sodium chloride 0.9 % 100 mL IVPB        100 mg 200 mL/hr over 30 Minutes Intravenous  Once 05/31/11 2148 05/31/11 2256          Medications / Allergies: per chart  Assessment / Plan: [name]  65 y.o. male   8 Days Post-Op  Procedure(s): APPENDECTOMY LAPAROSCOPIC  Problem List:  Principal Problem:  *Appendicitis  with abscess with postoperative ileus  -cont IV ABx  -CT if worse.  WBC WNL, no fevers, no pain, inc flatus all hopeful signs of recovery  -thin liquids - go slowly  -TNA for malnutrition Active Problems:  RESTLESS LEGS SYNDROME  SEIZURE DISORDER  stable  DYSPNEA  -inc walking helping  Fluid overload  -inc diuresis  -follow lytes -VTE prophylaxis- SCDs, etc -mobilize as tolerated to help recovery  Lorenso Courier, M.D., F.A.C.S. Gastrointestinal and Minimally Invasive Surgery [facility] [address], [address] [place], [place] [address] [phone] Main / Paging [phone] Voice Mail   06/09/2011

## 2011-06-09 NOTE — Progress Notes (Signed)
PARENTERAL NUTRITION CONSULT NOTE - FOLLOW UP  Pharmacy Consult for TNA Indication: post-op ileus  Allergies  Allergen Reactions  . Carbamazepine     REACTION: fatigue, sleepy  . Metronidazole     REACTION: GI upset  . Penicillins     REACTION: childhood, hives?  . Phenytoin     REACTION: rash, leg swelling    Patient Measurements: Height: 6' (182.9 cm) Weight: 150 lb (68.04 kg) (per patient ) IBW/kg (Calculated) : 77.6  Adjusted Body Weight:  Usual Weight:   Vital Signs: Temp: 98.7 F (37.1 C) (11/24 0607) Temp src: Oral (11/24 0607) BP: 128/81 mmHg (11/24 0607) Pulse Rate: 67  (11/24 0607) Intake/Output from previous day: 11/23 0701 - 11/24 0700 In: 1590 [P.O.:120; I.V.:920; IV Piggyback:200; TPN:350] Out: 3100 [Urine:3100] Intake/Output from this shift:    Labs:  [facility] 06/09/11 0630 06/08/11 0550 06/07/11 0450  WBC 8.4 7.0 8.0  HGB 12.8* 13.0 13.1  HCT 35.2* 37.3* 36.4*  PLT 371 372 339  APTT -- -- --  INR -- -- --     Basename 06/09/11 0630 06/08/11 0600 06/08/11 0550 06/07/11 0450  NA 129* -- 134* 131*  K 3.7 -- 4.0 4.3  CL 98 -- 102 98  CO2 26 -- 25 26  GLUCOSE 120* -- 120* 104*  BUN 18 -- 22 27*  CREATININE 0.46* -- 0.52 0.62  LABCREA -- -- -- --  CREAT24HRUR -- -- -- --  CALCIUM 7.6* -- 7.7* 8.2*  MG 1.7 -- -- 1.8  PHOS -- -- -- --  PROT 4.5* -- -- 4.8*  ALBUMIN 2.3* -- -- 2.5*  AST 21 -- -- 21  ALT 13 -- -- 14  ALKPHOS 74 -- -- 78  BILITOT 0.4 -- -- 0.6  BILIDIR -- -- -- --  IBILI -- -- -- --  PREALBUMIN -- 3.6* -- --  CHOLHDL -- -- -- --  CHOL -- -- -- --   Estimated Creatinine Clearance: 89.7 ml/min (by C-G formula based on Cr of 0.46).   No results found for this basename: GLUCAP:3 in the last 72 hours  Medications:  Scheduled:    . acetaminophen  650 mg Oral QID  . Flora-Q  1 capsule Oral Daily  . furosemide  40 mg Intravenous Once  . heparin subcutaneous  5,000 Units Subcutaneous Q8H  . lip balm  1 application  Topical BID  . ondansetron  4 mg Intravenous Q6H  . PHENobarbital  64.8 mg Oral QHS  . psyllium  1 packet Oral BID  . tigecycline (TYGACIL) IVPB  50 mg Intravenous Q12H   Infusions:    . dextrose 5 % and 0.45 % NaCl with KCl 20 mEq/[name] 50 mL/hr at 06/09/11 1030  . fat emulsion 250 mL (06/08/11 2226)  . TPN (CLINIMIX) +/- additives 40 mL/hr at 06/08/11 2227    Insulin Requirements in the past 24 hours:  None ordered   Current Nutrition:  TNA initiated at 54ml/hr 11/23 pm Lipids at 35ml/hr   Assessment:  Na Low- 129 meq/[name], other lytes WNL  Serum glucose high end normal range while NPO, IV D5 & 1/2 NS + 20 mEq K/[name]   Nutritional Goals:  1700 kCal, 85 grams of protein per day goal rate of 83 ml/hr  Plan: Plan goal rate 83 ml/hr, using 5/15 formula for now, monitor CBG's and cover with glycemic scale if necessary, then could consider using higher dextrose formula. Change IV fluid to NS with 20 meq K/[name], decrease rate to  40ml/hr Advance TNA to 60 ml/hr hr today. BMET tomorrow, monitor CBG's  Nathan [name], Nathan [name] 06/09/2011,11:02 AM

## 2011-06-10 ENCOUNTER — Inpatient Hospital Stay (HOSPITAL_COMMUNITY): Payer: BC Managed Care – PPO

## 2011-06-10 DIAGNOSIS — K560 Paralytic ileus: Secondary | ICD-10-CM

## 2011-06-10 LAB — BASIC METABOLIC PANEL
BUN: 14 mg/dL (ref 6–23)
Creatinine, Ser: 0.45 mg/dL — ABNORMAL LOW (ref 0.50–1.35)
GFR calc Af Amer: >90 mL/min (ref >90)
GFR calc non Af Amer: >90 mL/min (ref >90)

## 2011-06-10 LAB — GLUCOSE, CAPILLARY: Glucose-Capillary: 128 mg/dL — ABNORMAL HIGH (ref 70–99)

## 2011-06-10 MED ORDER — CLINIMIX E/DEXTROSE (5/15) 5 % IV SOLN
INTRAVENOUS | Status: AC
  Administered 2011-06-10: 17:00:00 via INTRAVENOUS
  Filled 2011-06-10: qty 2000

## 2011-06-10 MED ORDER — FAT EMULSION 20 % IV EMUL
250.0000 mL | INTRAVENOUS | Status: AC
  Administered 2011-06-10: 250 mL via INTRAVENOUS
  Filled 2011-06-10: qty 250

## 2011-06-10 NOTE — Progress Notes (Signed)
PARENTERAL NUTRITION CONSULT NOTE - FOLLOW UP  Pharmacy Consult for TNA Indication: post-op ileus  Allergies  Allergen Reactions  . Carbamazepine     REACTION: fatigue, sleepy  . Metronidazole     REACTION: GI upset  . Penicillins     REACTION: childhood, hives?  . Phenytoin     REACTION: rash, leg swelling    Patient Measurements: Height: 6' (182.9 cm) Weight: 150 lb (68.04 kg) (per patient ) IBW/kg (Calculated) : 77.6  Adjusted Body Weight:  Usual Weight:   Vital Signs: Temp: 98.5 F (36.9 C) (11/25 0611) Temp src: Oral (11/25 0611) BP: 147/74 mmHg (11/25 0611) Pulse Rate: 69  (11/25 0611) Intake/Output from previous day: 11/24 0701 - 11/25 0700 In: 2975.7 [P.O.:960; I.V.:326.7; IV Piggyback:200; TPN:1489] Out: 2950 [Urine:2950] Intake/Output from this shift:    Labs:  [facility] 06/09/11 1545 06/09/11 0630 06/08/11 0550  WBC 10.3 8.4 7.0  HGB 12.6* 12.8* 13.0  HCT 35.8* 35.2* 37.3*  PLT 384 371 372  APTT -- -- --  INR -- -- --     Basename 06/10/11 0500 06/09/11 1545 06/09/11 0630 06/08/11 0600  NA 127* 127* 129* --  K 3.6 3.8 3.7 --  CL 94* 97 98 --  CO2 28 27 26  --  GLUCOSE 123* 123* 120* --  BUN 14 15 18  --  CREATININE 0.45* 0.43* 0.46* --  LABCREA -- -- -- --  CREAT24HRUR -- -- -- --  CALCIUM 7.4* 7.3* 7.6* --  MG -- 1.7 1.7 --  PHOS -- 2.3 -- --  PROT -- 4.4* 4.5* --  ALBUMIN -- 2.3* 2.3* --  AST -- 20 21 --  ALT -- 14 13 --  ALKPHOS -- 77 74 --  BILITOT -- 0.4 0.4 --  BILIDIR -- -- -- --  IBILI -- -- -- --  PREALBUMIN -- 4.0* -- 3.6*  CHOLHDL -- -- -- --  CHOL -- 52 -- --   Estimated Creatinine Clearance: 89.7 ml/min (by C-G formula based on Cr of 0.45).    Basename 06/10/11 0005 06/09/11 1801 06/09/11 1201  GLUCAP 118* 119* 112*    Medications:  Scheduled:     . acetaminophen  650 mg Oral QID  . Flora-Q  1 capsule Oral Daily  . furosemide  40 mg Oral BID  . heparin subcutaneous  5,000 Units Subcutaneous Q8H  . lip balm   1 application Topical BID  . ondansetron  4 mg Intravenous Q6H  . PHENobarbital  64.8 mg Oral QHS  . potassium chloride  40 mEq Oral Daily  . psyllium  1 packet Oral BID  . tigecycline (TYGACIL) IVPB  50 mg Intravenous Q12H  . vitamin C  500 mg Oral Daily  . DISCONTD: psyllium  1 packet Oral BID   Infusions:     . fat emulsion 250 mL (06/08/11 2226)  . fat emulsion 250 mL (06/09/11 1754)  . sodium chloride 0.9 % 1,000 mL with potassium chloride 20 mEq infusion 20 mL/hr at 06/09/11 2222  . TPN (CLINIMIX) +/- additives 40 mL/hr at 06/08/11 2227  . TPN (CLINIMIX) +/- additives 60 mL/hr at 06/09/11 1753  . DISCONTD: dextrose 5 % and 0.45 % NaCl with KCl 20 mEq/L 50 mL/hr at 06/09/11 1030    Insulin Requirements in the past 24 hours:  None ordered   Current Nutrition:  TNA initiated at 77ml/hr Lipids at 61ml/hr   Assessment:  Na Low- 127 meq/L, other lytes WNL  CBGs controlled (<150 mg/dL) but would  like to see how patient tolerates advancing TNA rate before switching to Clinimix 5/20 (higher dextrose formula) to meet kcal goals.  Nutritional Goals:  1700 kCal, 85 grams of protein per day goal rate of 83 ml/hr  Plan:  Advance TNA to goal rate of 83 ml/hr, using 5/15 formula for now, monitor CBG's and cover with glycemic scale if necessary, then could consider using higher dextrose formula.  IVF rate at 10-20ml/hr ok.  F/u TNA labs in AM.  Clance Boll 06/10/2011,7:44 AM

## 2011-06-10 NOTE — Progress Notes (Signed)
POD# 9  Assessment/Plan:   1. Appendicitis with abscess (06/01/2011).  Has been on Tygacilasdf since surgery. Lap appendectomy.  Note, appendix was stuck to prolene hernia mesh. Prolonged hospital course with abdominal distention, nausea, loose stools.  CT scan 06/04/2011 showed small RLQ fluid collection, small free air, but no obvious abscess. WBC today is normal.  Patient has progressed a little.  Will plan to repeat CT scan tomorrow (7 days since last one).  Will hold metamucil, patient thinks it is "gumming him up" and he is not taking much liquid.   2.  Postoperative ileus (06/06/2011)    3.  VTE prophylaxis.  On SQ heparin.  4.  Malnutrition - PICC and TPN.  Labs for tomorrow.  5.  History of seizures.  Last seizure 9 years ago.  6.  Restless leg syndrome.    LOS: 10 days   Subjective:  Patient has struggled since surgery with distention, nausea.  His last BM was 2 days ago.  He felt real weak yesterday, but feels a little better today.  His wife is in the room.  Objective:   Filed Vitals:   06/10/11 0611  BP: 147/74  Pulse: 69  Temp: 98.5 F (36.9 C)  Resp: 18     Intake/Output from previous day:  11/24 0701 - 11/25 0700 In: 2975.7 [P.O.:960; I.V.:326.7; IV Piggyback:200; TPN:1489] Out: 2950 [Urine:2950]  Intake/Output this shift:      Physical Exam:   General: Thin WN WM who is alert.     HEENT: Normal. Pupils equal. Good dentition. .   Lungs: Clear.  Good inspiratory effort.   Abdomen: Moderate distention.  Has BS.  No localized tenderness.  I feel the hernia mesh in the RLQ, but no tenderness or mass.   Wound: Okay.  No redness or concern.   Extremities:  2+ edema of LE.   Neurologic:  Grossly intact to motor and sensory function.   Psychiatric: Has normal mood and affect. He's a little depressed about staying around in the hospital.   Lab Results:     [facility] 06/09/11 1545 06/09/11 0630  WBC 10.3 8.4  HGB 12.6* 12.8*  HCT 35.8* 35.2*  PLT 384 371      BMET    Basename 06/10/11 0500 06/09/11 1545  NA 127* 127*  K 3.6 3.8  CL 94* 97  CO2 28 27  GLUCOSE 123* 123*  BUN 14 15  CREATININE 0.45* 0.43*  CALCIUM 7.4* 7.3*    PT/INR  No results found for this basename: LABPROT:2,INR:2 in the last 72 hours  ABG  No results found for this basename: PHART:2,PCO2:2,PO2:2,HCO3:2 in the last 72 hours   Studies/Results:  Dg Abd 2 Views  06/08/2011  *RADIOLOGY REPORT*  Clinical Data: postop ileus  ABDOMEN - 2 VIEW  Comparison: 06/05/2011  Findings:  Multiple dilated loops of small bowel are identified throughout the abdomen and measuring up to 5.5 cm.  Multiple air-fluid levels are identified within the central abdomen.  Some gas and stool is noted within the colon.  IMPRESSION:  1.  Findings consistent with moderate to marked postop ileus.  Original Report Authenticated By: Rosealee Albee, M.D.     Anti-infectives:   Anti-infectives     Start     Dose/Rate Route Frequency Ordered Stop   06/03/11 1200   vancomycin (VANCOCIN) 50 mg/mL oral solution 500 mg  Status:  Discontinued        500 mg Oral 4 times per day 06/03/11 1610 06/03/11  1116   06/03/11 1000   metroNIDAZOLE (FLAGYL) tablet 500 mg  Status:  Discontinued        500 mg Oral 3 times per day 06/03/11 0900 06/03/11 0907   06/01/11 0947   tigecycline (TYGACIL) 50 mg in sodium chloride 0.9 % 100 mL IVPB        50 mg 200 mL/hr over 30 Minutes Intravenous Every 12 hours 05/31/11 2148     05/31/11 2200   tigecycline (TYGACIL) 100 mg in sodium chloride 0.9 % 100 mL IVPB        100 mg 200 mL/hr over 30 Minutes Intravenous  Once 05/31/11 2148 05/31/11 2256         Ovidio Kin, MD, FACS Pager: [phone],   [facility] Office: [phone] 06/10/2011

## 2011-06-11 ENCOUNTER — Inpatient Hospital Stay (HOSPITAL_COMMUNITY): Payer: BC Managed Care – PPO

## 2011-06-11 LAB — COMPREHENSIVE METABOLIC PANEL
AST: 26 U/L (ref 0–37)
CO2: 30 mEq/L (ref 19–32)
Chloride: 93 mEq/L — ABNORMAL LOW (ref 96–112)
Creatinine, Ser: 0.44 mg/dL — ABNORMAL LOW (ref 0.50–1.35)
GFR calc non Af Amer: >90 mL/min (ref >90)
Total Bilirubin: 0.6 mg/dL (ref 0.3–1.2)

## 2011-06-11 LAB — DIFFERENTIAL
Basophils Absolute: 0 10*3/uL (ref 0.0–0.1)
Basophils Relative: 0 % (ref 0–1)
Eosinophils Absolute: 0.1 10*3/uL (ref 0.0–0.7)
Eosinophils Relative: 1 % (ref 0–5)
Monocytes Absolute: 1.7 10*3/uL — ABNORMAL HIGH (ref 0.1–1.0)
Neutro Abs: 9.7 10*3/uL — ABNORMAL HIGH (ref 1.7–7.7)

## 2011-06-11 LAB — CBC
HCT: 38.6 % — ABNORMAL LOW (ref 39.0–52.0)
MCH: 31.3 pg (ref 26.0–34.0)
MCHC: 35.2 g/dL (ref 30.0–36.0)
MCV: 88.7 fL (ref 78.0–100.0)
RDW: 12.6 % (ref 11.5–15.5)

## 2011-06-11 LAB — PHOSPHORUS: Phosphorus: 2.8 mg/dL (ref 2.3–4.6)

## 2011-06-11 LAB — GLUCOSE, CAPILLARY: Glucose-Capillary: 143 mg/dL — ABNORMAL HIGH (ref 70–99)

## 2011-06-11 MED ORDER — TRACE MINERALS CR-CU-MN-SE-ZN 10-1000-500-60 MCG/ML IV SOLN
INTRAVENOUS | Status: AC
  Administered 2011-06-11: 19:00:00 via INTRAVENOUS
  Filled 2011-06-11: qty 2000

## 2011-06-11 MED ORDER — IOHEXOL 300 MG/ML  SOLN
100.0000 mL | Freq: Once | INTRAMUSCULAR | Status: AC | PRN
  Administered 2011-06-11: 100 mL via INTRAVENOUS

## 2011-06-11 MED ORDER — FAT EMULSION 20 % IV EMUL
250.0000 mL | INTRAVENOUS | Status: AC
  Administered 2011-06-11: 250 mL via INTRAVENOUS
  Filled 2011-06-11: qty 250

## 2011-06-11 MED ORDER — IOHEXOL 300 MG/ML  SOLN: 100.0000 mL | Freq: Once | INTRAMUSCULAR | Status: AC | PRN

## 2011-06-11 MED ORDER — POTASSIUM CHLORIDE 2 MEQ/ML IV SOLN
INTRAVENOUS | Status: DC
  Administered 2011-06-11 – 2011-06-13 (×2): via INTRAVENOUS
  Filled 2011-06-11 (×4): qty 1000

## 2011-06-11 NOTE — Progress Notes (Signed)
Patient ID: Nathan [name], male   DOB: Nov 19, 1945, 65 y.o.   MRN: [id number] 10 Days Post-Op  Subjective: Pt feels slightly worse today.  C/o mild increase in RLQ tenderness.  More nausea  Objective: Vital signs in last 24 hours: Temp:  [97.8 F (36.6 C)-98.5 F (36.9 C)] 98.5 F (36.9 C) (11/26 0623) Pulse Rate:  [67-72] 71  (11/26 0623) Resp:  [18] 18  (11/26 0623) BP: (120-134)/(75-78) 120/77 mmHg (11/26 0623) SpO2:  [96 %-100 %] 96 % (11/26 0623) Last BM Date: 06/08/11  Intake/Output from previous day: 11/25 0701 - 11/26 0700 In: 3957.6 [P.O.:1380; I.V.:456; IV Piggyback:200; TPN:1921.6] Out: 5100 [Urine:5100] Intake/Output this shift: Total I/O In: -  Out: 200 [Urine:200]  PE: Abd: soft, some distention, few BS, mild RLQ tenderness.  Incisions c/d/i.  Lab Results:   Basename 06/11/11 0500 06/09/11 1545  WBC 12.4* 10.3  HGB 13.6 12.6*  HCT 38.6* 35.8*  PLT 424* 384   BMET  Basename 06/11/11 0500 06/10/11 0500  NA 127* 127*  K 3.7 3.6  CL 93* 94*  CO2 30 28  GLUCOSE 123* 123*  BUN 14 14  CREATININE 0.44* 0.45*  CALCIUM 7.9* 7.4*   PT/INR No results found for this basename: LABPROT:2,INR:2 in the last 72 hours   Studies/Results: No results found.  Anti-infectives: Anti-infectives     Start     Dose/Rate Route Frequency Ordered Stop   06/03/11 1200   vancomycin (VANCOCIN) 50 mg/mL oral solution 500 mg  Status:  Discontinued        500 mg Oral 4 times per day 06/03/11 0907 06/03/11 1116   06/03/11 1000   metroNIDAZOLE (FLAGYL) tablet 500 mg  Status:  Discontinued        500 mg Oral 3 times per day 06/03/11 0900 06/03/11 0907   06/01/11 0947   tigecycline (TYGACIL) 50 mg in sodium chloride 0.9 % 100 mL IVPB        50 mg 200 mL/hr over 30 Minutes Intravenous Every 12 hours 05/31/11 2148     05/31/11 2200   tigecycline (TYGACIL) 100 mg in sodium chloride 0.9 % 100 mL IVPB        100 mg 200 mL/hr over 30 Minutes Intravenous  Once 05/31/11 2148  05/31/11 2256           Assessment/Plan  1. S/p lap appy for perf appy 2. Prolonged post-op ileus  Plan:  Repeat CT scan today to eval for post-op complication Cont abx for now as increase in WBC.  If no evidence of abscess may look at d/c abx soon Cont regular diet for now. Further recs after CT scan complete.   LOS: 11 days    Nathan [name] E 06/11/2011

## 2011-06-11 NOTE — Progress Notes (Signed)
PARENTERAL NUTRITION CONSULT NOTE - FOLLOW UP  Pharmacy Consult for TNA Indication: post-op ileus  Allergies  Allergen Reactions  . Carbamazepine     REACTION: fatigue, sleepy  . Metronidazole     REACTION: GI upset  . Penicillins     REACTION: childhood, hives?  . Phenytoin     REACTION: rash, leg swelling    Patient Measurements: Height: 6' (182.9 cm) Weight: 150 lb (68.04 kg) (per patient ) IBW/kg (Calculated) : 77.6  Adjusted Body Weight:  Usual Weight:   Vital Signs: Temp: 98.5 F (36.9 C) (11/26 0623) Temp src: Oral (11/26 0623) BP: 120/77 mmHg (11/26 0623) Pulse Rate: 71  (11/26 0623) Intake/Output from previous day: 11/25 0701 - 11/26 0700 In: 3957.6 [P.O.:1380; I.V.:456; IV Piggyback:200; TPN:1921.6] Out: 5100 [Urine:5100] Intake/Output from this shift:    Labs:  [facility] 06/11/11 0500 06/09/11 1545 06/09/11 0630  WBC 12.4* 10.3 8.4  HGB 13.6 12.6* 12.8*  HCT 38.6* 35.8* 35.2*  PLT 424* 384 371  APTT -- -- --  INR -- -- --     Basename 06/11/11 0500 06/10/11 0500 06/09/11 1545 06/09/11 0630  NA 127* 127* 127* --  K 3.7 3.6 3.8 --  CL 93* 94* 97 --  CO2 30 28 27  --  GLUCOSE 123* 123* 123* --  BUN 14 14 15  --  CREATININE 0.44* 0.45* 0.43* --  LABCREA -- -- -- --  CREAT24HRUR -- -- -- --  CALCIUM 7.9* 7.4* 7.3* --  MG 1.9 -- 1.7 1.7  PHOS 2.8 -- 2.3 --  PROT 4.9* -- 4.4* 4.5*  ALBUMIN 2.5* -- 2.3* 2.3*  AST 26 -- 20 21  ALT 17 -- 14 13  ALKPHOS 96 -- 77 74  BILITOT 0.6 -- 0.4 0.4  BILIDIR -- -- -- --  IBILI -- -- -- --  PREALBUMIN -- -- 4.0* --  CHOLHDL -- -- -- --  CHOL 50 -- 52 --   Estimated Creatinine Clearance: 89.7 ml/min (by C-G formula based on Cr of 0.44).    Basename 06/11/11 0602 06/11/11 0014 06/10/11 1756  GLUCAP 150* 123* 123*    Medications:  Scheduled:     . acetaminophen  650 mg Oral QID  . Flora-Q  1 capsule Oral Daily  . furosemide  40 mg Oral BID  . heparin subcutaneous  5,000 Units Subcutaneous Q8H  .  lip balm  1 application Topical BID  . ondansetron  4 mg Intravenous Q6H  . PHENobarbital  64.8 mg Oral QHS  . potassium chloride  40 mEq Oral Daily  . tigecycline (TYGACIL) IVPB  50 mg Intravenous Q12H  . vitamin C  500 mg Oral Daily  . DISCONTD: psyllium  1 packet Oral BID   Infusions:     . fat emulsion 250 mL (06/09/11 1754)  . fat emulsion 250 mL (06/10/11 1729)  . sodium chloride 0.9 % 1,000 mL with potassium chloride 20 mEq infusion 20 mL/hr at 06/10/11 2234  . TPN (CLINIMIX) +/- additives 60 mL/hr at 06/09/11 1753  . TPN (CLINIMIX) +/- additives 83 mL/hr at 06/10/11 1728    Insulin Requirements in the past 24 hours:  None ordered   Current Nutrition:  TNA at goal rate 83 ml/hr Lipids at 80ml/hr NS with K+ 20 mEq/[name] at 20 ml/hr   Assessment:  Na Low- 129 meq/[name], other lytes WNL  Na levels dropped when Lasix begun 11/23  CBG's avg 120's, high 150 mg/dl this am  Will not increase to 5/20 formula (  higher dextrose concentration)  Bland diet begun 11/25 pm, tolerated without nausea.   For abd CT w/contrast today, will be NPO for am, will not reduce rate to stimulate appetite.  Nutritional Goals:  1700 kCal, 85 grams of protein per day goal rate of 83 ml/hr  Plan:  TNA at goal rate of 83 ml/hr, using 5/15 formula. MVI, trace elements today (add every M-W-F)  Lipids 20% at 10 ml/hr.  IVF rate at 10-19ml/hr ok.  F/u abd CT results in am.  Nathan [name], Nathan [name] 06/11/2011,8:34 AM

## 2011-06-11 NOTE — Progress Notes (Signed)
Pt examined, agree with note above.  CT shows improved ileus but persistent 5.6X3 cm fluid collection, poss abscess.  Will ask IR to eval for perc drain.  Discussed with pt and wife  Mariella Saa MD, FACS  06/11/2011, 3:30 PM

## 2011-06-12 ENCOUNTER — Inpatient Hospital Stay (HOSPITAL_COMMUNITY): Payer: BC Managed Care – PPO

## 2011-06-12 LAB — CBC
HCT: 36.9 % — ABNORMAL LOW (ref 39.0–52.0)
Hemoglobin: 13 g/dL (ref 13.0–17.0)
RDW: 12.7 % (ref 11.5–15.5)
WBC: 11.2 10*3/uL — ABNORMAL HIGH (ref 4.0–10.5)

## 2011-06-12 LAB — GLUCOSE, CAPILLARY: Glucose-Capillary: 127 mg/dL — ABNORMAL HIGH (ref 70–99)

## 2011-06-12 LAB — BASIC METABOLIC PANEL
BUN: 16 mg/dL (ref 6–23)
Chloride: 93 mEq/L — ABNORMAL LOW (ref 96–112)
GFR calc Af Amer: >90 mL/min (ref >90)
Glucose, Bld: 114 mg/dL — ABNORMAL HIGH (ref 70–99)
Potassium: 3.7 mEq/L (ref 3.5–5.1)

## 2011-06-12 MED ORDER — CLINIMIX E/DEXTROSE (5/15) 5 % IV SOLN
INTRAVENOUS | Status: DC
  Administered 2011-06-12: 18:00:00 via INTRAVENOUS
  Filled 2011-06-12: qty 2000

## 2011-06-12 MED ORDER — HEPARIN SODIUM (PORCINE) 5000 UNIT/ML IJ SOLN
5000.0000 [IU] | Freq: Three times a day (TID) | INTRAMUSCULAR | Status: DC
  Administered 2011-06-12 – 2011-06-18 (×14): 5000 [IU] via SUBCUTANEOUS
  Filled 2011-06-12 (×21): qty 1

## 2011-06-12 NOTE — Progress Notes (Signed)
Nutrition Follow-up:  Pt off floor for CT at time of visit.  Pt continues Clinimix 5/15 @ 83 mL/hr with 20% IV lipids 3 days per week providing 1618 kcal on average, 99 g protein daily meeting 84% kcal needs, 105% protein needs.  Pt has also advanced diet to bland which he is not tolerating consistently.  PO intake 0-40% of meals.  Continues to c/o nausea.  Per RN note, pt not walking halls.  Intake/Output Summary (Last 24 hours) at 06/12/11 1548 Last data filed at 06/12/11 0550  Gross per 24 hour  Intake   1511 ml  Output      1 ml  Net   1510 ml   2 BMs yesterday.  CMP     Component Value Date/Time   NA 127* 06/12/2011 0340   K 3.7 06/12/2011 0340   CL 93* 06/12/2011 0340   CO2 31 06/12/2011 0340   GLUCOSE 114* 06/12/2011 0340   BUN 16 06/12/2011 0340   CREATININE 0.49* 06/12/2011 0340   CALCIUM 7.9* 06/12/2011 0340   PROT 4.9* 06/11/2011 0500   ALBUMIN 2.5* 06/11/2011 0500   AST 26 06/11/2011 0500   ALT 17 06/11/2011 0500   ALKPHOS 96 06/11/2011 0500   BILITOT 0.6 06/11/2011 0500   GFRNONAA >90 06/12/2011 0340   GFRAA >90 06/12/2011 0340    Scheduled Meds:   . acetaminophen  650 mg Oral QID  . Flora-Q  1 capsule Oral Daily  . furosemide  40 mg Oral BID  . heparin subcutaneous  5,000 Units Subcutaneous Q8H  . lip balm  1 application Topical BID  . ondansetron  4 mg Intravenous Q6H  . PHENobarbital  64.8 mg Oral QHS  . potassium chloride  40 mEq Oral Daily  . tigecycline (TYGACIL) IVPB  50 mg Intravenous Q12H  . vitamin C  500 mg Oral Daily  . DISCONTD: heparin subcutaneous  5,000 Units Subcutaneous Q8H   Continuous Infusions:   . fat emulsion 250 mL (06/10/11 1729)  . fat emulsion 250 mL (06/11/11 1838)  . sodium chloride 0.9 % 1,000 mL with potassium chloride 20 mEq infusion 20 mL/hr at 06/11/11 2004  . TPN (CLINIMIX) +/- additives 83 mL/hr at 06/10/11 1728  . TPN (CLINIMIX) +/- additives    . TPN (CLINIMIX) +/- additives 83 mL/hr at 06/11/11 1838  .  DISCONTD: sodium chloride 0.9 % 1,000 mL with potassium chloride 20 mEq infusion 20 mL/hr at 06/10/11 2234   PRN Meds:.alum & mag hydroxide-simeth, bisacodyl, diphenhydrAMINE, diphenhydrAMINE, guaiFENesin-dextromethorphan, HYDROmorphone, iohexol, metoCLOPramide (REGLAN) injection, promethazine, sodium chloride, zolpidem   Inadequate oral intake, ongoing.  Intervention: 1.  Parenteral nutrition; agree with PharmD recommendation to continue 5/15 mix for now as PO diet is advanced.  Pt likely meeting 100% kcal needs with supplemental PO intake. 2.  General healthful diet; encourage intake as able.  Wean TPN to 50% when pt consuming >30% of meal consistently.  D/c when pt consuming >50% of meals consistently.  Monitoring: 1. Parenteral nutrition; initiation with tolerance, to meet as close to 100% of need as able with pre-mixed solution. Goal somewhat met due to national shortage, PO intake increasing.  2.  Food/Beverage; pt to consume >50% of meals consistently.  Pager: [phone]

## 2011-06-12 NOTE — Progress Notes (Signed)
Patient ID: Nathan [name], male   DOB: 05/17/1946, 65 y.o.   MRN: [id number] 11 Days Post-Op  Subjective: Pt feels better today, still with some nausea, but improved. Multiple BMs after contrast yesterday  Objective: Vital signs in last 24 hours: Temp:  [98 F (36.7 C)-98.2 F (36.8 C)] 98.1 F (36.7 C) (11/27 0525) Pulse Rate:  [65-70] 65  (11/27 0525) Resp:  [16-18] 18  (11/27 0525) BP: (104-129)/(65-76) 122/73 mmHg (11/27 0525) SpO2:  [98 %-99 %] 98 % (11/27 0525) Last BM Date: 06/11/11  Intake/Output from previous day: 11/26 0701 - 11/27 0700 In: 1591 [P.O.:200; I.V.:317; TPN:1074] Out: 728 [Urine:726; Stool:2] Intake/Output this shift:    PE: Abd: soft, still bloated.  +BS, minimally tender in RLQ, incisions c/d/i  Lab Results:   Basename 06/12/11 0340 06/11/11 0500  WBC 11.2* 12.4*  HGB 13.0 13.6  HCT 36.9* 38.6*  PLT 455* 424*   BMET  Basename 06/12/11 0340 06/11/11 0500  NA 127* 127*  K 3.7 3.7  CL 93* 93*  CO2 31 30  GLUCOSE 114* 123*  BUN 16 14  CREATININE 0.49* 0.44*  CALCIUM 7.9* 7.9*   PT/INR No results found for this basename: LABPROT:2,INR:2 in the last 72 hours   Studies/Results: Ct Abdomen Pelvis W Contrast  06/11/2011  *RADIOLOGY REPORT*  Clinical Data: Appendectomy.  Right lower quadrant discomfort, nausea, fever and constipation.  Question abscess.  CT ABDOMEN AND PELVIS WITH CONTRAST  Technique:  Multidetector CT imaging of the abdomen and pelvis was performed following the standard protocol during bolus administration of intravenous contrast.  Contrast: OMNIPAQUE IOHEXOL 300 MG/ML IV SOLN  Comparison: 06/05/2011.  Findings: Lung bases show bilateral pleural effusions with compressive atelectasis in both lower lobes, right greater than left.  Heart size normal.  No pericardial effusion.  There may be a focal perfusion anomaly in the periphery of the anterior right hepatic lobe (image 16).  Mild periportal edema. There are two new areas of  subcapsular fluid along the right hepatic lobe.  Gallbladder adrenal glands are unremarkable. Horseshoe kidney.  A 7 mm low attenuation lesion in the left kidney is likely a cyst.  Tiny stone is also seen in the left kidney. Spleen, pancreas and stomach are unremarkable.  Duodenal wall appears thickened (image 41).  There has been some improvement in small bowel dilatation. Distal small bowel is decompressed and contains oral contrast, unlike on 06/05/2011. There is a fluid collection in the anterior right lower quadrant, with hyperattenuating rim, measuring 5.6 x 3.0 cm (previously 5.9 x 2.4 cm when remeasured).  No worrisome lytic or sclerotic lesions. Diffuse body wall edema is noted.  IMPRESSION:  1.  Peripherally hyperattenuating fluid collection in the anterior right lower quadrant may be slightly larger, and is worrisome for abscess. 2.  Interval improvement in small bowel dilatation, without resolution.  Question associated duodenitis. 3. Two new areas of subcapsular perihepatic fluid.  Developing abscess cannot be excluded. 4.  Bilateral pleural effusions, right greater than left. 5.  Left renal stone.  Original Report Authenticated By: Reyes Ivan, M.D.    Anti-infectives: Anti-infectives     Start     Dose/Rate Route Frequency Ordered Stop   06/03/11 1200   vancomycin (VANCOCIN) 50 mg/mL oral solution 500 mg  Status:  Discontinued        500 mg Oral 4 times per day 06/03/11 0907 06/03/11 1116   06/03/11 1000   metroNIDAZOLE (FLAGYL) tablet 500 mg  Status:  Discontinued  500 mg Oral 3 times per day 06/03/11 0900 06/03/11 0907   06/01/11 0947   tigecycline (TYGACIL) 50 mg in sodium chloride 0.9 % 100 mL IVPB        50 mg 200 mL/hr over 30 Minutes Intravenous Every 12 hours 05/31/11 2148     05/31/11 2200   tigecycline (TYGACIL) 100 mg in sodium chloride 0.9 % 100 mL IVPB        100 mg 200 mL/hr over 30 Minutes Intravenous  Once 05/31/11 2148 05/31/11 2256            Assessment/Plan  1. S/p lap appy for perf appy 2. Post-op abscess in RLQ  Plan: Pt currently NPO.  Will have IR eval for perc drain of abscess. After perc drain patient may resume a regular diet. Cont abx for now. Repeat cbc in am   LOS: 12 days    Cadince Hilscher E 06/12/2011

## 2011-06-12 NOTE — Procedures (Signed)
CT guided 54F pigtail into RLQ abscess 5ml purulent aspirate sent for gs, c&s No complication No blood loss. See complete dictation in [facility].

## 2011-06-12 NOTE — Progress Notes (Signed)
Pt examined.  Feeling better and doing well p perc drain.  Fluid cloudy/serosang. Cont current Rx.  Mariella Saa MD, FACS  06/12/2011, 4:58 PM

## 2011-06-12 NOTE — Progress Notes (Signed)
PARENTERAL NUTRITION CONSULT NOTE - FOLLOW UP  Pharmacy Consult for TNA Indication: post-op ileus  Allergies  Allergen Reactions  . Carbamazepine     REACTION: fatigue, sleepy  . Metronidazole     REACTION: GI upset  . Penicillins     REACTION: childhood, hives?  . Phenytoin     REACTION: rash, leg swelling    Patient Measurements: Height: 6' (182.9 cm) Weight: 150 lb (68.04 kg) (per patient ) IBW/kg (Calculated) : 77.6  Adjusted Body Weight:  Usual Weight:   Vital Signs: Temp: 98.1 F (36.7 C) (11/27 0525) Temp src: Oral (11/27 0525) BP: 122/73 mmHg (11/27 0525) Pulse Rate: 65  (11/27 0525) Intake/Output from previous day: 11/26 0701 - 11/27 0700 In: 1591 [P.O.:200; I.V.:317; TPN:1074] Out: 728 [Urine:726; Stool:2] Intake/Output from this shift:    Labs:  [facility] 06/12/11 0340 06/11/11 0500 06/09/11 1545  WBC 11.2* 12.4* 10.3  HGB 13.0 13.6 12.6*  HCT 36.9* 38.6* 35.8*  PLT 455* 424* 384  APTT -- -- --  INR -- -- --     Basename 06/12/11 0340 06/11/11 0500 06/10/11 0500 06/09/11 1545  NA 127* 127* 127* --  K 3.7 3.7 3.6 --  CL 93* 93* 94* --  CO2 31 30 28  --  GLUCOSE 114* 123* 123* --  BUN 16 14 14  --  CREATININE 0.49* 0.44* 0.45* --  LABCREA -- -- -- --  CREAT24HRUR -- -- -- --  CALCIUM 7.9* 7.9* 7.4* --  MG -- 1.9 -- 1.7  PHOS -- 2.8 -- 2.3  PROT -- 4.9* -- 4.4*  ALBUMIN -- 2.5* -- 2.3*  AST -- 26 -- 20  ALT -- 17 -- 14  ALKPHOS -- 96 -- 77  BILITOT -- 0.6 -- 0.4  BILIDIR -- -- -- --  IBILI -- -- -- --  PREALBUMIN -- 5.4* -- 4.0*  CHOLHDL -- -- -- --  CHOL -- 50 -- 52   Estimated Creatinine Clearance: 89.7 ml/min (by C-G formula based on Cr of 0.49).    Basename 06/12/11 0459 06/12/11 0013 06/11/11 1817  GLUCAP 136* 127* 120*    Medications:  Scheduled:     . acetaminophen  650 mg Oral QID  . Flora-Q  1 capsule Oral Daily  . furosemide  40 mg Oral BID  . heparin subcutaneous  5,000 Units Subcutaneous Q8H  . lip balm  1  application Topical BID  . ondansetron  4 mg Intravenous Q6H  . PHENobarbital  64.8 mg Oral QHS  . potassium chloride  40 mEq Oral Daily  . tigecycline (TYGACIL) IVPB  50 mg Intravenous Q12H  . vitamin C  500 mg Oral Daily  . DISCONTD: heparin subcutaneous  5,000 Units Subcutaneous Q8H   Infusions:     . fat emulsion 250 mL (06/10/11 1729)  . fat emulsion 250 mL (06/11/11 1838)  . sodium chloride 0.9 % 1,000 mL with potassium chloride 20 mEq infusion 20 mL/hr at 06/11/11 2004  . TPN (CLINIMIX) +/- additives 83 mL/hr at 06/10/11 1728  . TPN (CLINIMIX) +/- additives 83 mL/hr at 06/11/11 1838  . DISCONTD: sodium chloride 0.9 % 1,000 mL with potassium chloride 20 mEq infusion 20 mL/hr at 06/10/11 2234    Insulin Requirements in the past 24 hours:  None ordered   Current Nutrition:  TNA at goal rate 83 ml/hr Lipids will be ordered every M-W-F due to back-order situation NS with K+ 20 mEq/L at 20 ml/hr   Assessment:  Na Low- 129 meq/L,  other lytes WNL  Na levels dropped when Lasix begun 11/23  CBG's avg 120's,No glycemic scale ordered  Will not increase to 5/20 formula (higher dextrose concentration)  Bland diet begun 11/25 pm, tolerated without nausea. Will likely be NPO for abscess drainage/IR  Plan regular diet when abscess(es) drained, will begin to taper TNA at that point  Nutritional Goals:  1700 kCal, 85 grams of protein per day goal rate of 83 ml/hr  Plan:  TNA at goal rate of 83 ml/hr, using 5/15 formula.   Lipids/MRI/Trace elements every M-W-F only  IVF rate at 10-51ml/hr ok.Chilton Si, Kaisei Gilbo L 06/12/2011,9:47 AM

## 2011-06-12 NOTE — H&P (Signed)
SHRAY HUNLEY is an 65 y.o. male.   Chief Complaint: Right Lower Quadrant Abscess  HPI: Scheduled for RLQ abscess drain placement in IR  Past Medical History  Diagnosis Date  . DYSPNEA 02/09/2009  . RESTLESS LEGS SYNDROME 02/28/2009  . SEIZURE DISORDER 02/09/2009    Past Surgical History  Procedure Date  . Laparoscopic appendectomy 06/01/2011    Procedure: APPENDECTOMY LAPAROSCOPIC;  Surgeon: Kandis Cocking, MD;  Location: [facility];  Service: General;  Laterality: N/A;    Family History  Problem Relation Age of Onset  . Cancer Mother     ovarian  . Cancer Father     multiple myeloma  . Stroke Neg Hx     grandparent   Social History:  reports that he has never smoked. He has never used smokeless tobacco. He reports that he does not drink alcohol or use illicit drugs.  Allergies:  Allergies  Allergen Reactions  . Carbamazepine     REACTION: fatigue, sleepy  . Metronidazole     REACTION: GI upset  . Penicillins     REACTION: childhood, hives?  . Phenytoin     REACTION: rash, leg swelling    Medications Prior to Admission  Medication Dose Route Frequency Provider Last Rate Last Dose  . acetaminophen (TYLENOL) tablet 650 mg  650 mg Oral QID Ardeth Sportsman, MD   650 mg at 06/08/11 1357  . alum & mag hydroxide-simeth (MAALOX PLUS) 400-400-40 MG/5ML suspension 30 mL  30 mL Oral Q6H PRN Ardeth Sportsman, MD      . bisacodyl (DULCOLAX) suppository 10 mg  10 mg Rectal Once Ernestene Mention, MD   10 mg at 06/04/11 1915  . bisacodyl (DULCOLAX) suppository 10 mg  10 mg Rectal Q12H PRN Ardeth Sportsman, MD      . dextrose 5 % and 0.45 % NaCl with KCl 20 mEq/L infusion   Intravenous Once Kandis Cocking, MD 150 mL/hr at 05/31/11 2225    . diphenhydrAMINE (BENADRYL) capsule 25 mg  25 mg Oral Q6H PRN Ardeth Sportsman, MD      . diphenhydrAMINE (BENADRYL) injection 12.5-25 mg  12.5-25 mg Intravenous Q6H PRN Ardeth Sportsman, MD      . fat emulsion 20 % infusion 250 mL  250 mL Intravenous  Continuous TPN Otho Bellows, PHARMD 10 mL/hr at 06/08/11 2226 250 mL at 06/08/11 2226  . fat emulsion 20 % infusion 250 mL  250 mL Intravenous Continuous TPN Otho Bellows, PHARMD 10 mL/hr at 06/09/11 1754 250 mL at 06/09/11 1754  . fat emulsion 20 % infusion 250 mL  250 mL Intravenous Continuous TPN Clance Boll, PHARMD 10 mL/hr at 06/10/11 1729 250 mL at 06/10/11 1729  . fat emulsion 20 % infusion 250 mL  250 mL Intravenous Continuous TPN Otho Bellows, PHARMD 10 mL/hr at 06/11/11 1838 250 mL at 06/11/11 1838  . Flora-Q (FLORA-Q) Capsule 1 capsule  1 capsule Oral Daily Ardeth Sportsman, MD   1 capsule at 06/10/11 1055  . furosemide (LASIX) injection 20 mg  20 mg Intravenous Once Sherrie George, [place]   20 mg at 06/06/11 1527  . furosemide (LASIX) injection 40 mg  40 mg Intravenous Once Ardeth Sportsman, MD   40 mg at 06/08/11 1357  . furosemide (LASIX) tablet 40 mg  40 mg Oral BID Ardeth Sportsman, MD   40 mg at 06/11/11 2000  . guaiFENesin-dextromethorphan (ROBITUSSIN DM) 100-10 MG/5ML syrup 15 mL  15 mL Oral Q4H PRN Ardeth Sportsman, MD      . heparin injection 5,000 Units  5,000 Units Subcutaneous Q8H Letha Cape, [place]      . HYDROmorphone (DILAUDID) bolus via infusion 0.5-2 mg  0.5-2 mg Intravenous Q30 min PRN Ardeth Sportsman, MD      . iohexol (OMNIPAQUE) 300 MG/ML injection 100 mL  100 mL Intravenous Once PRN Medication Radiologist   100 mL at 05/31/11 2024  . iohexol (OMNIPAQUE) 300 MG/ML injection 100 mL  100 mL Intravenous Once PRN Medication Radiologist   100 mL at 06/05/11 1335  . iohexol (OMNIPAQUE) 300 MG/ML injection 100 mL  100 mL Intravenous Once PRN Medication Radiologist   100 mL at 06/11/11 1256  . iohexol (OMNIPAQUE) 300 MG/ML injection 100 mL  100 mL Intravenous Once PRN Medication Radiologist      . ketorolac (TORADOL) 30 MG/ML injection 15-30 mg  15-30 mg Intravenous Once PRN Riesa Pope, MD   30 mg at 06/01/11 1747  . lip balm (CARMEX) ointment 1 application  1 application  Topical BID Ardeth Sportsman, MD   1 application at 06/11/11 1011  . metoCLOPramide (REGLAN) injection 5-10 mg  5-10 mg Intravenous Q6H PRN Ardeth Sportsman, MD      . ondansetron [facility]) 4 MG/2ML injection        4 mg at 06/07/11 0337  . ondansetron (ZOFRAN) injection 4 mg  4 mg Intravenous Q6H Ardeth Sportsman, MD   4 mg at 06/12/11 0335  . PHENobarbital (LUMINAL) tablet 64.8 mg  64.8 mg Oral QHS Kandis Cocking, MD   64.8 mg at 06/11/11 2216  . potassium chloride SA (K-DUR,KLOR-CON) CR tablet 40 mEq  40 mEq Oral Daily Ardeth Sportsman, MD   40 mEq at 06/10/11 1055  . promethazine (PHENERGAN) injection 12.5 mg  12.5 mg Intravenous Q4H PRN Mariella Saa, MD   12.5 mg at 06/02/11 1610  . sodium chloride 0.9 % 1,000 mL with potassium chloride 20 mEq infusion   Intravenous Continuous Ardeth Sportsman, MD 20 mL/hr at 06/11/11 2004    . sodium chloride 0.9 % injection 10 mL  10 mL Intracatheter PRN Kandis Cocking, MD   10 mL at 06/12/11 0355  . tigecycline (TYGACIL) 100 mg in sodium chloride 0.9 % 100 mL IVPB  100 mg Intravenous Once Kandis Cocking, MD   100 mg at 05/31/11 2226   Followed by  . tigecycline (TYGACIL) 50 mg in sodium chloride 0.9 % 100 mL IVPB  50 mg Intravenous Q12H Kandis Cocking, MD   50 mg at 06/11/11 2216  . tpn solution (CLINIMIX E 5/15) 1,000 mL with multivitamins adult 10 mL, trace elements Cr-Cu-Mn-Se-Zn 1 mL infusion   Intravenous Continuous TPN Otho Bellows, PHARMD 40 mL/hr at 06/08/11 2227    . tpn solution (CLINIMIX E 5/15) 2,000 mL infusion   Intravenous Continuous TPN Otho Bellows, PHARMD 60 mL/hr at 06/09/11 1753    . tpn solution (CLINIMIX E 5/15) 2,000 mL infusion   Intravenous Continuous TPN Clance Boll, PHARMD 83 mL/hr at 06/10/11 1728    . tpn solution (CLINIMIX E 5/15) 2,000 mL with multivitamins adult 10 mL, trace elements Cr-Cu-Mn-Se-Zn 1 mL infusion   Intravenous Continuous TPN Otho Bellows, PHARMD 83 mL/hr at 06/11/11 1838    . vitamin C (ASCORBIC ACID)  tablet 500 mg  500 mg Oral Daily Ardeth Sportsman, MD   500 mg  at 06/10/11 1055  . zolpidem (AMBIEN) tablet 5-10 mg  5-10 mg Oral QHS PRN Ardeth Sportsman, MD      . DISCONTD: 0.9 %  sodium chloride infusion   Intravenous Continuous Nicholes Stairs, MD 125 mL/hr at 05/31/11 1858 1,000 mL at 05/31/11 1858  . DISCONTD: 0.9 %  sodium chloride infusion   Intravenous Continuous Sherrie George, PA 75 mL/hr at 06/07/11 1900    . DISCONTD: acetaminophen (TYLENOL) tablet 650 mg  650 mg Oral Q6H PRN Sherrie George, PA      . DISCONTD: bupivacaine (MARCAINE) 0.25 % injection    PRN Kandis Cocking, MD   19 mL at 06/01/11 0356  . DISCONTD: dextrose 5 % and 0.45 % NaCl with KCl 20 mEq/L infusion   Intravenous Continuous Kandis Cocking, MD 125 mL/hr at 06/03/11 0302    . DISCONTD: dextrose 5 % and 0.45 % NaCl with KCl 20 mEq/L infusion   Intravenous Continuous Kandis Cocking, MD 100 mL/hr at 06/08/11 1041    . DISCONTD: dextrose 5 % and 0.45 % NaCl with KCl 20 mEq/L infusion   Intravenous Continuous Ardeth Sportsman, MD 50 mL/hr at 06/09/11 1030    . DISCONTD: fentaNYL (SUBLIMAZE) injection    PRN Rolly Pancake   50 mcg at 06/01/11 0303  . DISCONTD: glycopyrrolate (ROBINUL) injection    PRN Rolly Pancake   0.4 mg at 06/01/11 0355  . DISCONTD: heparin injection 5,000 Units  5,000 Units Subcutaneous Q8H Kandis Cocking, MD      . DISCONTD: heparin injection 5,000 Units  5,000 Units Subcutaneous Q8H Kandis Cocking, MD   5,000 Units at 06/12/11 [phone]  . DISCONTD: HYDROcodone-acetaminophen (NORCO) 5-325 MG per tablet 1-2 tablet  1-2 tablet Oral Q4H PRN Kandis Cocking, MD      . DISCONTD: HYDROcodone-acetaminophen (NORCO) 5-325 MG per tablet 1-2 tablet  1-2 tablet Oral Q4H PRN Sherrie George, PA      . DISCONTD: ketorolac (TORADOL) 15 MG/ML injection 15 mg  15 mg Intravenous Q6H Ernestene Mention, MD   15 mg at 06/06/11 0911  . DISCONTD: lactated ringers infusion    Continuous PRN Rolly Pancake      .  DISCONTD: lactated ringers irrigation solution    PRN Kandis Cocking, MD   1,000 mL at 06/01/11 0340  . DISCONTD: lidocaine (cardiac) 100 mg/75ml (XYLOCAINE) 20 MG/ML injection 2%    PRN Rolly Pancake   80 mg at 06/01/11 0223  . DISCONTD: loperamide (IMODIUM) capsule 2 mg  2 mg Oral PRN Ernestene Mention, MD   2 mg at 06/03/11 2052  . DISCONTD: meperidine (DEMEROL) injection 6.25-12.5 mg  6.25-12.5 mg Intravenous PRN Riesa Pope, MD      . DISCONTD: metoCLOPramide (REGLAN) injection 10 mg  10 mg Intravenous Q6H Ernestene Mention, MD   10 mg at 06/06/11 0649  . DISCONTD: metroNIDAZOLE (FLAGYL) tablet 500 mg  500 mg Oral Q8H Ernestene Mention, MD      . DISCONTD: morphine 2 MG/ML injection 1-3 mg  1-3 mg Intravenous Q2H PRN Kandis Cocking, MD      . DISCONTD: neostigmine (PROSTIGMINE) injection   Intravenous PRN Rolly Pancake   3 mg at 06/01/11 0355  . DISCONTD: ondansetron (ZOFRAN) injection 4 mg  4 mg Intravenous Q6H PRN Valarie Merino, MD   4 mg at 06/08/11 0451  . DISCONTD: ondansetron (ZOFRAN) injection  PRN Rolly Pancake   4 mg at 06/01/11 0350  . DISCONTD: ondansetron (ZOFRAN) tablet 4 mg  4 mg Oral Q6H PRN Kandis Cocking, MD   4 mg at 06/06/11 2238  . DISCONTD: oxyCODONE (Oxy IR/ROXICODONE) immediate release tablet 5-10 mg  5-10 mg Oral Q4H PRN Ardeth Sportsman, MD      . DISCONTD: propofol (DIPRIVAN) 10 MG/ML infusion    PRN Rolly Pancake   20 mg at 06/01/11 0349  . DISCONTD: psyllium (HYDROCIL/METAMUCIL) packet 1 packet  1 packet Oral BID Ardeth Sportsman, MD   1 packet at 06/09/11 1035  . DISCONTD: psyllium (HYDROCIL/METAMUCIL) packet 1 packet  1 packet Oral BID Ardeth Sportsman, MD   1 packet at 06/10/11 [phone]  . DISCONTD: rocuronium (ZEMURON) injection    PRN Rolly Pancake   10 mg at 06/01/11 0322  . DISCONTD: sodium chloride 0.9 % 1,000 mL with potassium chloride 20 mEq infusion   Intravenous Continuous Ardeth Sportsman, MD 20 mL/hr at 06/10/11 2234    . DISCONTD:  succinylcholine (ANECTINE) injection    PRN Rolly Pancake   100 mg at 06/01/11 1191  . DISCONTD: vancomycin (VANCOCIN) 50 mg/mL oral solution 500 mg  500 mg Oral Q6H Ernestene Mention, MD       Medications Prior to Admission  Medication Sig Dispense Refill  . PHENObarbital (LUMINAL) 60 MG tablet Take 60 mg by mouth at bedtime.          Results for orders placed during the hospital encounter of 05/31/11 (from the past 48 hour(s))  GLUCOSE, CAPILLARY     Status: Abnormal   Collection Time   06/10/11 12:14 PM      Component Value Range Comment   Glucose-Capillary 128 (*) 70 - 99 (mg/dL)   GLUCOSE, CAPILLARY     Status: Abnormal   Collection Time   06/10/11  5:56 PM      Component Value Range Comment   Glucose-Capillary 123 (*) 70 - 99 (mg/dL)   GLUCOSE, CAPILLARY     Status: Abnormal   Collection Time   06/11/11 12:14 AM      Component Value Range Comment   Glucose-Capillary 123 (*) 70 - 99 (mg/dL)   COMPREHENSIVE METABOLIC PANEL     Status: Abnormal   Collection Time   06/11/11  5:00 AM      Component Value Range Comment   Sodium 127 (*) 135 - 145 (mEq/L)    Potassium 3.7  3.5 - 5.1 (mEq/L)    Chloride 93 (*) 96 - 112 (mEq/L)    CO2 30  19 - 32 (mEq/L)    Glucose, Bld 123 (*) 70 - 99 (mg/dL)    BUN 14  6 - 23 (mg/dL)    Creatinine, Ser 4.78 (*) 0.50 - 1.35 (mg/dL)    Calcium 7.9 (*) 8.4 - 10.5 (mg/dL)    Total Protein 4.9 (*) 6.0 - 8.3 (g/dL)    Albumin 2.5 (*) 3.5 - 5.2 (g/dL)    AST 26  0 - 37 (U/L)    ALT 17  0 - 53 (U/L)    Alkaline Phosphatase 96  39 - 117 (U/L)    Total Bilirubin 0.6  0.3 - 1.2 (mg/dL)    GFR calc non Af Amer >90  >90 (mL/min)    GFR calc Af Amer >90  >90 (mL/min)   MAGNESIUM     Status: Normal   Collection Time   06/11/11  5:00 AM      Component Value Range Comment   Magnesium 1.9  1.5 - 2.5 (mg/dL)   PHOSPHORUS     Status: Normal   Collection Time   06/11/11  5:00 AM      Component Value Range Comment   Phosphorus 2.8  2.3 - 4.6 (mg/dL)     CBC     Status: Abnormal   Collection Time   06/11/11  5:00 AM      Component Value Range Comment   WBC 12.4 (*) 4.0 - 10.5 (K/uL)    RBC 4.35  4.22 - 5.81 (MIL/uL)    Hemoglobin 13.6  13.0 - 17.0 (g/dL)    HCT 16.1 (*) 09.6 - 52.0 (%)    MCV 88.7  78.0 - 100.0 (fL)    MCH 31.3  26.0 - 34.0 (pg)    MCHC 35.2  30.0 - 36.0 (g/dL)    RDW 04.5  40.9 - 81.1 (%)    Platelets 424 (*) 150 - 400 (K/uL)   DIFFERENTIAL     Status: Abnormal   Collection Time   06/11/11  5:00 AM      Component Value Range Comment   Neutrophils Relative 78 (*) 43 - 77 (%)    Neutro Abs 9.7 (*) 1.7 - 7.7 (K/uL)    Lymphocytes Relative 7 (*) 12 - 46 (%)    Lymphs Abs 0.9  0.7 - 4.0 (K/uL)    Monocytes Relative 14 (*) 3 - 12 (%)    Monocytes Absolute 1.7 (*) 0.1 - 1.0 (K/uL)    Eosinophils Relative 1  0 - 5 (%)    Eosinophils Absolute 0.1  0.0 - 0.7 (K/uL)    Basophils Relative 0  0 - 1 (%)    Basophils Absolute 0.0  0.0 - 0.1 (K/uL)   CHOLESTEROL, TOTAL     Status: Normal   Collection Time   06/11/11  5:00 AM      Component Value Range Comment   Cholesterol 50  0 - 200 (mg/dL)   TRIGLYCERIDES     Status: Normal   Collection Time   06/11/11  5:00 AM      Component Value Range Comment   Triglycerides 25  <150 (mg/dL)   PREALBUMIN     Status: Abnormal   Collection Time   06/11/11  5:00 AM      Component Value Range Comment   Prealbumin 5.4 (*) 17.0 - 34.0 (mg/dL)   GLUCOSE, CAPILLARY     Status: Abnormal   Collection Time   06/11/11  6:02 AM      Component Value Range Comment   Glucose-Capillary 150 (*) 70 - 99 (mg/dL)   GLUCOSE, CAPILLARY     Status: Abnormal   Collection Time   06/11/11  3:05 PM      Component Value Range Comment   Glucose-Capillary 143 (*) 70 - 99 (mg/dL)   GLUCOSE, CAPILLARY     Status: Abnormal   Collection Time   06/11/11  6:17 PM      Component Value Range Comment   Glucose-Capillary 120 (*) 70 - 99 (mg/dL)   GLUCOSE, CAPILLARY     Status: Abnormal   Collection Time    06/12/11 12:13 AM      Component Value Range Comment   Glucose-Capillary 127 (*) 70 - 99 (mg/dL)    Comment 1 Documented in Chart      Comment 2 Notify RN     CBC  Status: Abnormal   Collection Time   06/12/11  3:40 AM      Component Value Range Comment   WBC 11.2 (*) 4.0 - 10.5 (K/uL)    RBC 4.12 (*) 4.22 - 5.81 (MIL/uL)    Hemoglobin 13.0  13.0 - 17.0 (g/dL)    HCT 56.2 (*) 13.0 - 52.0 (%)    MCV 89.6  78.0 - 100.0 (fL)    MCH 31.6  26.0 - 34.0 (pg)    MCHC 35.2  30.0 - 36.0 (g/dL)    RDW 86.5  78.4 - 69.6 (%)    Platelets 455 (*) 150 - 400 (K/uL)   BASIC METABOLIC PANEL     Status: Abnormal   Collection Time   06/12/11  3:40 AM      Component Value Range Comment   Sodium 127 (*) 135 - 145 (mEq/L)    Potassium 3.7  3.5 - 5.1 (mEq/L)    Chloride 93 (*) 96 - 112 (mEq/L)    CO2 31  19 - 32 (mEq/L)    Glucose, Bld 114 (*) 70 - 99 (mg/dL)    BUN 16  6 - 23 (mg/dL)    Creatinine, Ser 2.95 (*) 0.50 - 1.35 (mg/dL)    Calcium 7.9 (*) 8.4 - 10.5 (mg/dL)    GFR calc non Af Amer >90  >90 (mL/min)    GFR calc Af Amer >90  >90 (mL/min)   GLUCOSE, CAPILLARY     Status: Abnormal   Collection Time   06/12/11  4:59 AM      Component Value Range Comment   Glucose-Capillary 136 (*) 70 - 99 (mg/dL)    Comment 1 Documented in Chart      Comment 2 Notify RN      Ct Abdomen Pelvis W Contrast  06/11/2011  *RADIOLOGY REPORT*  Clinical Data: Appendectomy.  Right lower quadrant discomfort, nausea, fever and constipation.  Question abscess.  CT ABDOMEN AND PELVIS WITH CONTRAST  Technique:  Multidetector CT imaging of the abdomen and pelvis was performed following the standard protocol during bolus administration of intravenous contrast.  Contrast: OMNIPAQUE IOHEXOL 300 MG/ML IV SOLN  Comparison: 06/05/2011.  Findings: Lung bases show bilateral pleural effusions with compressive atelectasis in both lower lobes, right greater than left.  Heart size normal.  No pericardial effusion.  There may  be a focal perfusion anomaly in the periphery of the anterior right hepatic lobe (image 16).  Mild periportal edema. There are two new areas of subcapsular fluid along the right hepatic lobe.  Gallbladder adrenal glands are unremarkable. Horseshoe kidney.  A 7 mm low attenuation lesion in the left kidney is likely a cyst.  Tiny stone is also seen in the left kidney. Spleen, pancreas and stomach are unremarkable.  Duodenal wall appears thickened (image 41).  There has been some improvement in small bowel dilatation. Distal small bowel is decompressed and contains oral contrast, unlike on 06/05/2011. There is a fluid collection in the anterior right lower quadrant, with hyperattenuating rim, measuring 5.6 x 3.0 cm (previously 5.9 x 2.4 cm when remeasured).  No worrisome lytic or sclerotic lesions. Diffuse body wall edema is noted.  IMPRESSION:  1.  Peripherally hyperattenuating fluid collection in the anterior right lower quadrant may be slightly larger, and is worrisome for abscess. 2.  Interval improvement in small bowel dilatation, without resolution.  Question associated duodenitis. 3. Two new areas of subcapsular perihepatic fluid.  Developing abscess cannot be excluded. 4.  Bilateral pleural effusions,  right greater than left. 5.  Left renal stone.  Original Report Authenticated By: Reyes Ivan, M.D.    ROS  Blood pressure 122/73, pulse 65, temperature 98.1 F (36.7 C), temperature source Oral, resp. rate 18, height 6' (1.829 m), weight 150 lb (68.04 kg), SpO2 98.00%. Physical Exam   Assessment/Plan Pt scheduled for RLQ abscess drain placement today. Understands procedure benefits and risks and agreeable to proceed. Consent signed. Pt scheduled for Heparin inj every 8 hrs. Last dose 6am today.  Orders in computer to hold 2pm dose for procedure.  Corneluis Allston A 06/12/2011, 9:39 AM

## 2011-06-12 NOTE — Progress Notes (Signed)
Pt states "he feels a weak, and nauseated at times" Refuses to walk at present time. Pt did not walk yesterday either due to weakness and nausea feeling. States "maybe as day goes on and after drain placement he will feel like taking a walk in the halls". Encouraged use of IS, TCDB. Pt is actively ambulating in his room, to chair, and bathroom.

## 2011-06-13 LAB — GLUCOSE, CAPILLARY
Glucose-Capillary: 113 mg/dL — ABNORMAL HIGH (ref 70–99)
Glucose-Capillary: 108 mg/dL — ABNORMAL HIGH (ref 70–99)
Glucose-Capillary: 113 mg/dL — ABNORMAL HIGH (ref 70–99)

## 2011-06-13 LAB — CBC
MCH: 31.3 pg (ref 26.0–34.0)
MCHC: 34.4 g/dL (ref 30.0–36.0)
MCV: 90.8 fL (ref 78.0–100.0)
Platelets: 478 10*3/uL — ABNORMAL HIGH (ref 150–400)
RDW: 13 % (ref 11.5–15.5)
WBC: 7.8 10*3/uL (ref 4.0–10.5)

## 2011-06-13 MED ORDER — CLINIMIX E/DEXTROSE (5/15) 5 % IV SOLN
INTRAVENOUS | Status: AC
  Filled 2011-06-13: qty 2000

## 2011-06-13 NOTE — Progress Notes (Signed)
12 Days Post-Op  Subjective: RLQ abscess drain placed 11/27 Pt does feel some better today; a little less painful Still feels "yucky" Objective: Vital signs in last 24 hours: Temp:  [98.2 F (36.8 C)-98.9 F (37.2 C)] 98.3 F (36.8 C) (11/28 0635) Pulse Rate:  [60-74] 64  (11/28 0635) Resp:  [9-18] 16  (11/28 0635) BP: (103-114)/(57-71) 108/67 mmHg (11/28 0635) SpO2:  [81 %-100 %] 97 % (11/28 0635) Last BM Date: 06/12/11  Intake/Output from previous day: 11/27 0701 - 11/28 0700 In: 2116.1 [P.O.:420; I.V.:329.3; TPN:1366.7] Out: 625 [Urine:600; Drains:25] Intake/Output this shift: Total I/O In: 120 [P.O.:120] Out: -   PE:  Output now serous approx 30cc in bag Afeb; stable Wbc down  Lab Results:   Basename 06/13/11 0500 06/12/11 0340  WBC 7.8 11.2*  HGB 12.5* 13.0  HCT 36.3* 36.9*  PLT 478* 455*   BMET  Basename 06/13/11 0500 06/12/11 0340 06/11/11 0500  NA -- 127* 127*  K 4.0 3.7 --  CL -- 93* 93*  CO2 -- 31 30  GLUCOSE -- 114* 123*  BUN -- 16 14  CREATININE -- 0.49* 0.44*  CALCIUM -- 7.9* 7.9*   PT/INR  Basename 06/12/11 1027  LABPROT 16.9*  INR 1.35   ABG No results found for this basename: PHART:2,PCO2:2,PO2:2,HCO3:2 in the last 72 hours  Studies/Results: Ct Guided Abscess Drain  06/12/2011  *RADIOLOGY REPORT*  Clinical data:  Abscess post appendectomy.  CT-GUIDED PELVIC ABSCESS DRAINAGE CATHETER PLACEMENT  Technique and findings: The procedure, risks (including but not limited to bleeding, infection, organ damage), benefits, and alternatives were explained to the patient.  Questions regarding the procedure were encouraged and answered.  The patient understands and consents to the procedure.Select axial scans through the pelvis were obtained.  The right lower quadrant collection was identified and an appropriate skin entry site was localized. Site was marked, prepped with Betadine, draped in usual sterile fashion, infiltrated locally with 1% lidocaine.   Intravenous Fentanyl and Versed were administered as conscious sedation during continuous cardiorespiratory monitoring by the radiology RN, with a total moderate sedation time of 15 minutes.  Under CT fluoroscopic guidance, a 18 gauge trocar needle was advanced into the right lower quadrant collection.  Purulent material could be aspirated.  An Amplatz wire advanced easily, its position confirmed on CT.  The tract was dilated to allow placement of a 12-French pigtail catheter, formed within the collection. Catheter was secured externally with O-Prolene suture and Statlock. A sample of the aspirate was sent for Gram stain, culture and sensitivity. The patient tolerated the procedure well.  No immediate complication.  IMPRESSION: 1.  Technically successful CT-guided right lower quadrant peritoneal abscess drainage catheter placement.  Original Report Authenticated By: Osa Craver, M.D.   Ct Abdomen Pelvis W Contrast  06/11/2011  *RADIOLOGY REPORT*  Clinical Data: Appendectomy.  Right lower quadrant discomfort, nausea, fever and constipation.  Question abscess.  CT ABDOMEN AND PELVIS WITH CONTRAST  Technique:  Multidetector CT imaging of the abdomen and pelvis was performed following the standard protocol during bolus administration of intravenous contrast.  Contrast: OMNIPAQUE IOHEXOL 300 MG/ML IV SOLN  Comparison: 06/05/2011.  Findings: Lung bases show bilateral pleural effusions with compressive atelectasis in both lower lobes, right greater than left.  Heart size normal.  No pericardial effusion.  There may be a focal perfusion anomaly in the periphery of the anterior right hepatic lobe (image 16).  Mild periportal edema. There are two new areas of subcapsular fluid along  the right hepatic lobe.  Gallbladder adrenal glands are unremarkable. Horseshoe kidney.  A 7 mm low attenuation lesion in the left kidney is likely a cyst.  Tiny stone is also seen in the left kidney. Spleen, pancreas and  stomach are unremarkable.  Duodenal wall appears thickened (image 41).  There has been some improvement in small bowel dilatation. Distal small bowel is decompressed and contains oral contrast, unlike on 06/05/2011. There is a fluid collection in the anterior right lower quadrant, with hyperattenuating rim, measuring 5.6 x 3.0 cm (previously 5.9 x 2.4 cm when remeasured).  No worrisome lytic or sclerotic lesions. Diffuse body wall edema is noted.  IMPRESSION:  1.  Peripherally hyperattenuating fluid collection in the anterior right lower quadrant may be slightly larger, and is worrisome for abscess. 2.  Interval improvement in small bowel dilatation, without resolution.  Question associated duodenitis. 3. Two new areas of subcapsular perihepatic fluid.  Developing abscess cannot be excluded. 4.  Bilateral pleural effusions, right greater than left. 5.  Left renal stone.  Original Report Authenticated By: Reyes Ivan, M.D.    Anti-infectives: Anti-infectives     Start     Dose/Rate Route Frequency Ordered Stop   06/03/11 1200   vancomycin (VANCOCIN) 50 mg/mL oral solution 500 mg  Status:  Discontinued        500 mg Oral 4 times per day 06/03/11 0907 06/03/11 1116   06/03/11 1000   metroNIDAZOLE (FLAGYL) tablet 500 mg  Status:  Discontinued        500 mg Oral 3 times per day 06/03/11 0900 06/03/11 0907   06/01/11 0947   tigecycline (TYGACIL) 50 mg in sodium chloride 0.9 % 100 mL IVPB        50 mg 200 mL/hr over 30 Minutes Intravenous Every 12 hours 05/31/11 2148     05/31/11 2200   tigecycline (TYGACIL) 100 mg in sodium chloride 0.9 % 100 mL IVPB        100 mg 200 mL/hr over 30 Minutes Intravenous  Once 05/31/11 2148 05/31/11 2256          Assessment/Plan: s/p Procedure(s): APPENDECTOMY LAPAROSCOPIC  RLQ drain intact Will follow Plan per surgery   Issacc Merlo A 06/13/2011

## 2011-06-13 NOTE — Progress Notes (Signed)
Patient ID: Nathan [name], male   DOB: April 10, 1946, 65 y.o.   MRN: [id number] 12 Days Post-Op  Subjective: Pt feels weak today.  Feels a little less pressure in abd after perc drain.  Still some nausea, but improved after eating.  Offered ensure supplements but pt refused saying "i don't drink that kind of thing."  Objective: Vital signs in last 24 hours: Temp:  [98.2 F (36.8 C)-98.9 F (37.2 C)] 98.3 F (36.8 C) (11/28 0635) Pulse Rate:  [60-74] 64  (11/28 0635) Resp:  [9-18] 16  (11/28 0635) BP: (103-114)/(57-71) 108/67 mmHg (11/28 0635) SpO2:  [81 %-100 %] 97 % (11/28 0635) Last BM Date: 06/12/11  Intake/Output from previous day: 11/27 0701 - 11/28 0700 In: 2116.1 [P.O.:420; I.V.:329.3; TPN:1366.7] Out: 625 [Urine:600; Drains:25] Intake/Output this shift: Total I/O In: 120 [P.O.:120] Out: -   PE: Abd: soft, less distended, +BS, minimally tender around his drain.  JP with serosang output. Ht: regular Lungs: CTAB  Lab Results:   Basename 06/13/11 0500 06/12/11 0340  WBC 7.8 11.2*  HGB 12.5* 13.0  HCT 36.3* 36.9*  PLT 478* 455*   BMET  Basename 06/13/11 0500 06/12/11 0340 06/11/11 0500  NA -- 127* 127*  K 4.0 3.7 --  CL -- 93* 93*  CO2 -- 31 30  GLUCOSE -- 114* 123*  BUN -- 16 14  CREATININE -- 0.49* 0.44*  CALCIUM -- 7.9* 7.9*   PT/INR  Basename 06/12/11 1027  LABPROT 16.9*  INR 1.35     Studies/Results: Ct Guided Abscess Drain  06/12/2011  *RADIOLOGY REPORT*  Clinical data:  Abscess post appendectomy.  CT-GUIDED PELVIC ABSCESS DRAINAGE CATHETER PLACEMENT  Technique and findings: The procedure, risks (including but not limited to bleeding, infection, organ damage), benefits, and alternatives were explained to the patient.  Questions regarding the procedure were encouraged and answered.  The patient understands and consents to the procedure.Select axial scans through the pelvis were obtained.  The right lower quadrant collection was identified and an  appropriate skin entry site was localized. Site was marked, prepped with Betadine, draped in usual sterile fashion, infiltrated locally with 1% lidocaine.  Intravenous Fentanyl and Versed were administered as conscious sedation during continuous cardiorespiratory monitoring by the radiology RN, with a total moderate sedation time of 15 minutes.  Under CT fluoroscopic guidance, a 18 gauge trocar needle was advanced into the right lower quadrant collection.  Purulent material could be aspirated.  An Amplatz wire advanced easily, its position confirmed on CT.  The tract was dilated to allow placement of a 12-French pigtail catheter, formed within the collection. Catheter was secured externally with O-Prolene suture and Statlock. A sample of the aspirate was sent for Gram stain, culture and sensitivity. The patient tolerated the procedure well.  No immediate complication.  IMPRESSION: 1.  Technically successful CT-guided right lower quadrant peritoneal abscess drainage catheter placement.  Original Report Authenticated By: Osa Craver, M.D.   Ct Abdomen Pelvis W Contrast  06/11/2011  *RADIOLOGY REPORT*  Clinical Data: Appendectomy.  Right lower quadrant discomfort, nausea, fever and constipation.  Question abscess.  CT ABDOMEN AND PELVIS WITH CONTRAST  Technique:  Multidetector CT imaging of the abdomen and pelvis was performed following the standard protocol during bolus administration of intravenous contrast.  Contrast: OMNIPAQUE IOHEXOL 300 MG/ML IV SOLN  Comparison: 06/05/2011.  Findings: Lung bases show bilateral pleural effusions with compressive atelectasis in both lower lobes, right greater than left.  Heart size normal.  No pericardial effusion.  There may be a focal perfusion anomaly in the periphery of the anterior right hepatic lobe (image 16).  Mild periportal edema. There are two new areas of subcapsular fluid along the right hepatic lobe.  Gallbladder adrenal glands are unremarkable.  Horseshoe kidney.  A 7 mm low attenuation lesion in the left kidney is likely a cyst.  Tiny stone is also seen in the left kidney. Spleen, pancreas and stomach are unremarkable.  Duodenal wall appears thickened (image 41).  There has been some improvement in small bowel dilatation. Distal small bowel is decompressed and contains oral contrast, unlike on 06/05/2011. There is a fluid collection in the anterior right lower quadrant, with hyperattenuating rim, measuring 5.6 x 3.0 cm (previously 5.9 x 2.4 cm when remeasured).  No worrisome lytic or sclerotic lesions. Diffuse body wall edema is noted.  IMPRESSION:  1.  Peripherally hyperattenuating fluid collection in the anterior right lower quadrant may be slightly larger, and is worrisome for abscess. 2.  Interval improvement in small bowel dilatation, without resolution.  Question associated duodenitis. 3. Two new areas of subcapsular perihepatic fluid.  Developing abscess cannot be excluded. 4.  Bilateral pleural effusions, right greater than left. 5.  Left renal stone.  Original Report Authenticated By: Reyes Ivan, M.D.    Anti-infectives: Anti-infectives     Start     Dose/Rate Route Frequency Ordered Stop   06/03/11 1200   vancomycin (VANCOCIN) 50 mg/mL oral solution 500 mg  Status:  Discontinued        500 mg Oral 4 times per day 06/03/11 0907 06/03/11 1116   06/03/11 1000   metroNIDAZOLE (FLAGYL) tablet 500 mg  Status:  Discontinued        500 mg Oral 3 times per day 06/03/11 0900 06/03/11 0907   06/01/11 0947   tigecycline (TYGACIL) 50 mg in sodium chloride 0.9 % 100 mL IVPB        50 mg 200 mL/hr over 30 Minutes Intravenous Every 12 hours 05/31/11 2148     05/31/11 2200   tigecycline (TYGACIL) 100 mg in sodium chloride 0.9 % 100 mL IVPB        100 mg 200 mL/hr over 30 Minutes Intravenous  Once 05/31/11 2148 05/31/11 2256           Assessment/Plan  1. S/p lap appy for perf appy 2. Post op abscess, s/p perc drain 3.  PCM/TNA  Plan: 1. Start weaning TNA today 2. Cont with drain, will likely need HH for drain flushes at time of d/c 3. Cont abx, can probably switch to po, will d/w MD 4. Hopefully can aim for d/c within the next few days.   LOS: 13 days    Gerard Bonus E 06/13/2011

## 2011-06-13 NOTE — Progress Notes (Signed)
PARENTERAL NUTRITION CONSULT NOTE - FOLLOW UP  Pharmacy Consult for TNA Indication: post-op ileus  Allergies  Allergen Reactions  . Carbamazepine     REACTION: fatigue, sleepy  . Metronidazole     REACTION: GI upset  . Penicillins     REACTION: childhood, hives?  . Phenytoin     REACTION: rash, leg swelling    Patient Measurements: Height: 6' (182.9 cm) Weight: 150 lb (68.04 kg) (per patient ) IBW/kg (Calculated) : 77.6   Vital Signs: Temp: 98.3 F (36.8 C) (11/28 0635) Temp src: Oral (11/28 0635) BP: 108/67 mmHg (11/28 0635) Pulse Rate: 64  (11/28 0635) Intake/Output from previous day: 11/27 0701 - 11/28 0700 In: 2116.1 [P.O.:420; I.V.:329.3; TPN:1366.7] Out: 625 [Urine:600; Drains:25] Intake/Output from this shift: Total I/O In: 120 [P.O.:120] Out: -   Labs:  Basename 06/13/11 0500 06/12/11 1027 06/12/11 0340 06/11/11 0500  WBC 7.8 -- 11.2* 12.4*  HGB 12.5* -- 13.0 13.6  HCT 36.3* -- 36.9* 38.6*  PLT 478* -- 455* 424*  APTT -- -- -- --  INR -- 1.35 -- --     Basename 06/13/11 0500 06/12/11 0340 06/11/11 0500  NA -- 127* 127*  K 4.0 3.7 3.7  CL -- 93* 93*  CO2 -- 31 30  GLUCOSE -- 114* 123*  BUN -- 16 14  CREATININE -- 0.49* 0.44*  LABCREA -- -- --  CREAT24HRUR -- -- --  CALCIUM -- 7.9* 7.9*  MG -- -- 1.9  PHOS -- -- 2.8  PROT -- -- 4.9*  ALBUMIN -- -- 2.5*  AST -- -- 26  ALT -- -- 17  ALKPHOS -- -- 96  BILITOT -- -- 0.6  BILIDIR -- -- --  IBILI -- -- --  PREALBUMIN -- -- 5.4*  CHOLHDL -- -- --  CHOL -- -- 50   Estimated Creatinine Clearance: 89.7 ml/min (by C-G formula based on Cr of 0.49).    Basename 06/13/11 0647 06/12/11 2347 06/12/11 1900  GLUCAP 113* 131* 97    Medications:  Scheduled:     . acetaminophen  650 mg Oral QID  . Flora-Q  1 capsule Oral Daily  . heparin subcutaneous  5,000 Units Subcutaneous Q8H  . lip balm  1 application Topical BID  . ondansetron  4 mg Intravenous Q6H  . PHENobarbital  64.8 mg Oral QHS  .  potassium chloride  40 mEq Oral Daily  . tigecycline (TYGACIL) IVPB  50 mg Intravenous Q12H  . vitamin C  500 mg Oral Daily  . DISCONTD: furosemide  40 mg Oral BID   Infusions:     . fat emulsion 250 mL (06/11/11 1838)  . sodium chloride 0.9 % 1,000 mL with potassium chloride 20 mEq infusion 20 mL/hr at 06/11/11 2004  . TPN (CLINIMIX) +/- additives    . TPN (CLINIMIX) +/- additives 83 mL/hr at 06/11/11 1838  . DISCONTD: TPN (CLINIMIX) +/- additives 83 mL/hr at 06/12/11 1734    Insulin Requirements in the past 24 hours:  None ordered   Current Nutrition:  TNA at goal rate 83 ml/hr Lipids will be ordered every M-W-F due to back-order situation NS with K+ 20 mEq/L at 20 ml/hr   Assessment:  New order to wean TNA  CBG's yesterday were 91-136, No glycemic scale ordered  Ate 100% of morning meal 11/28, but per notes, pt may lack motivation for PO intake at times.  Nutritional Goals:  1700 kCal, 85 grams of protein per day goal rate of 83 ml/hr  Plan:  Reduce TNA rate by half to 89ml/hr, using 5/15 formula.   D/C TNA at 1800 tonight when bag is finished.  Do not re-order.   Lynann Beaver PharmD  Pager [phone] 06/13/2011 12:09 PM

## 2011-06-13 NOTE — Progress Notes (Signed)
Pt examined, agree with above  Mariella Saa MD, FACS  06/13/2011, 7:15 PM

## 2011-06-14 LAB — COMPREHENSIVE METABOLIC PANEL
ALT: 39 U/L (ref 0–53)
AST: 33 U/L (ref 0–37)
Albumin: 2.5 g/dL — ABNORMAL LOW (ref 3.5–5.2)
CO2: 29 mEq/L (ref 19–32)
Chloride: 94 mEq/L — ABNORMAL LOW (ref 96–112)
GFR calc non Af Amer: >90 mL/min (ref >90)
Sodium: 126 mEq/L — ABNORMAL LOW (ref 135–145)
Total Bilirubin: 0.5 mg/dL (ref 0.3–1.2)

## 2011-06-14 LAB — PHOSPHORUS: Phosphorus: 2.9 mg/dL (ref 2.3–4.6)

## 2011-06-14 LAB — GLUCOSE, CAPILLARY: Glucose-Capillary: 100 mg/dL — ABNORMAL HIGH (ref 70–99)

## 2011-06-14 MED ORDER — CIPROFLOXACIN HCL 500 MG PO TABS
500.0000 mg | ORAL_TABLET | Freq: Two times a day (BID) | ORAL | Status: DC
  Administered 2011-06-14 – 2011-06-18 (×9): 500 mg via ORAL
  Filled 2011-06-14 (×10): qty 1

## 2011-06-14 NOTE — Progress Notes (Signed)
Patient ID: Nathan [name], male   DOB: 1946/05/18, 65 y.o.   MRN: [id number] 13 Days Post-Op  Subjective: Pt feels a little less nauseated today.  Less pain.  Had bowel movement overnight that he describes as "black" and "sticky"  Ate 2 boiled eggs and applesauce for breakfast this am.  Objective: Vital signs in last 24 hours: Temp:  [97.7 F (36.5 C)-98.8 F (37.1 C)] 98.2 F (36.8 C) (11/29 0600) Pulse Rate:  [65-82] 65  (11/29 0600) Resp:  [18] 18  (11/29 0600) BP: (104-111)/(64-72) 104/64 mmHg (11/29 0600) SpO2:  [100 %] 100 % (11/29 0600) Last BM Date: 06/13/11  Intake/Output from previous day: 11/28 0701 - 11/29 0700 In: 2388 [P.O.:480; I.V.:642; TPN:1221] Out: -  Intake/Output this shift:    PE: Abd: soft, minimal tenderness, +BS, mild bloating, JP with 15cc of output yesterday.  No output this am. CX: few GNR (prelim) Lab Results:   Basename 06/13/11 0500 06/12/11 0340  WBC 7.8 11.2*  HGB 12.5* 13.0  HCT 36.3* 36.9*  PLT 478* 455*   BMET  Basename 06/14/11 0555 06/13/11 0500 06/12/11 0340  NA 126* -- 127*  K 4.1 4.0 --  CL 94* -- 93*  CO2 29 -- 31  GLUCOSE 104* -- 114*  BUN 21 -- 16  CREATININE 0.53 -- 0.49*  CALCIUM 7.9* -- 7.9*   PT/INR  Basename 06/12/11 1027  LABPROT 16.9*  INR 1.35     Studies/Results: Ct Guided Abscess Drain  06/12/2011  *RADIOLOGY REPORT*  Clinical data:  Abscess post appendectomy.  CT-GUIDED PELVIC ABSCESS DRAINAGE CATHETER PLACEMENT  Technique and findings: The procedure, risks (including but not limited to bleeding, infection, organ damage), benefits, and alternatives were explained to the patient.  Questions regarding the procedure were encouraged and answered.  The patient understands and consents to the procedure.Select axial scans through the pelvis were obtained.  The right lower quadrant collection was identified and an appropriate skin entry site was localized. Site was marked, prepped with Betadine, draped in usual  sterile fashion, infiltrated locally with 1% lidocaine.  Intravenous Fentanyl and Versed were administered as conscious sedation during continuous cardiorespiratory monitoring by the radiology RN, with a total moderate sedation time of 15 minutes.  Under CT fluoroscopic guidance, a 18 gauge trocar needle was advanced into the right lower quadrant collection.  Purulent material could be aspirated.  An Amplatz wire advanced easily, its position confirmed on CT.  The tract was dilated to allow placement of a 12-French pigtail catheter, formed within the collection. Catheter was secured externally with O-Prolene suture and Statlock. A sample of the aspirate was sent for Gram stain, culture and sensitivity. The patient tolerated the procedure well.  No immediate complication.  IMPRESSION: 1.  Technically successful CT-guided right lower quadrant peritoneal abscess drainage catheter placement.  Original Report Authenticated By: Osa Craver, M.D.    Anti-infectives: Anti-infectives     Start     Dose/Rate Route Frequency Ordered Stop   06/03/11 1200   vancomycin (VANCOCIN) 50 mg/mL oral solution 500 mg  Status:  Discontinued        500 mg Oral 4 times per day 06/03/11 0907 06/03/11 1116   06/03/11 1000   metroNIDAZOLE (FLAGYL) tablet 500 mg  Status:  Discontinued        500 mg Oral 3 times per day 06/03/11 0900 06/03/11 0907   06/01/11 0947   tigecycline (TYGACIL) 50 mg in sodium chloride 0.9 % 100 mL IVPB  50 mg 200 mL/hr over 30 Minutes Intravenous Every 12 hours 05/31/11 2148     05/31/11 2200   tigecycline (TYGACIL) 100 mg in sodium chloride 0.9 % 100 mL IVPB        100 mg 200 mL/hr over 30 Minutes Intravenous  Once 05/31/11 2148 05/31/11 2256           Assessment/Plan  1. S/p perf appy with lap appy 2. Post-op fluid collection, s/p perc drain  Plan: 1. D/c tygacil incase this is causing some of nausea.  Will switch to po cipro probably 2. Cont to encourage po intake 3.  Drain with minimal output, will d/w md re: timing of repeat scan.   LOS: 14 days    Tene Gato E 06/14/2011

## 2011-06-14 NOTE — Progress Notes (Signed)
Overall better today. I agree that the Tygacil could be causing his nausea and we will discontinue this as there are no signs of infection. Will repeat CT after the drain has been in for 5 days.  Mariella Saa MD, FACS  06/14/2011, 5:39 PM

## 2011-06-14 NOTE — Progress Notes (Addendum)
13 Days Post-Op  Subjective: RLQ abscess drain placed 11/27  Objective: Vital signs in last 24 hours: Temp:  [97.7 F (36.5 C)-98.8 F (37.1 C)] 98.2 F (36.8 C) (11/29 0600) Pulse Rate:  [65-82] 65  (11/29 0600) Resp:  [18] 18  (11/29 0600) BP: (104-111)/(64-72) 104/64 mmHg (11/29 0600) SpO2:  [100 %] 100 % (11/29 0600) Last BM Date: 06/13/11  Intake/Output from previous day: 11/28 0701 - 11/29 0700 In: 2388 [P.O.:480; I.V.:642; TPN:1221] Out: -  Intake/Output this shift:    PE:  Afeb; WBC nl Site clean and dry; NT; no bleeding; no sign of infection Output 25 cc 11/27; none recorded 11/28---11/29 Output is serous in color in bag (scant) Cxs so far: abundant wbc/ gram neg rods   Lab Results:   Basename 06/13/11 0500 06/12/11 0340  WBC 7.8 11.2*  HGB 12.5* 13.0  HCT 36.3* 36.9*  PLT 478* 455*   BMET  Basename 06/14/11 0555 06/13/11 0500 06/12/11 0340  NA 126* -- 127*  K 4.1 4.0 --  CL 94* -- 93*  CO2 29 -- 31  GLUCOSE 104* -- 114*  BUN 21 -- 16  CREATININE 0.53 -- 0.49*  CALCIUM 7.9* -- 7.9*   PT/INR  Basename 06/12/11 1027  LABPROT 16.9*  INR 1.35   ABG No results found for this basename: PHART:2,PCO2:2,PO2:2,HCO3:2 in the last 72 hours  Studies/Results: Ct Guided Abscess Drain  06/12/2011  *RADIOLOGY REPORT*  Clinical data:  Abscess post appendectomy.  CT-GUIDED PELVIC ABSCESS DRAINAGE CATHETER PLACEMENT  Technique and findings: The procedure, risks (including but not limited to bleeding, infection, organ damage), benefits, and alternatives were explained to the patient.  Questions regarding the procedure were encouraged and answered.  The patient understands and consents to the procedure.Select axial scans through the pelvis were obtained.  The right lower quadrant collection was identified and an appropriate skin entry site was localized. Site was marked, prepped with Betadine, draped in usual sterile fashion, infiltrated locally with 1% lidocaine.   Intravenous Fentanyl and Versed were administered as conscious sedation during continuous cardiorespiratory monitoring by the radiology RN, with [name] total moderate sedation time of 15 minutes.  Under CT fluoroscopic guidance, [name] 18 gauge trocar needle was advanced into the right lower quadrant collection.  Purulent material could be aspirated.  An Amplatz wire advanced easily, its position confirmed on CT.  The tract was dilated to allow placement of [name] 12-French pigtail catheter, formed within the collection. Catheter was secured externally with O-Prolene suture and Statlock. [name] sample of the aspirate was sent for Gram stain, culture and sensitivity. The patient tolerated the procedure well.  No immediate complication.  IMPRESSION: 1.  Technically successful CT-guided right lower quadrant peritoneal abscess drainage catheter placement.  Original Report Authenticated By: Osa Craver, M.D.    Anti-infectives: Anti-infectives     Start     Dose/Rate Route Frequency Ordered Stop   06/03/11 1200   vancomycin (VANCOCIN) 50 mg/mL oral solution 500 mg  Status:  Discontinued        500 mg Oral 4 times per day 06/03/11 0907 06/03/11 1116   06/03/11 1000   metroNIDAZOLE (FLAGYL) tablet 500 mg  Status:  Discontinued        500 mg Oral 3 times per day 06/03/11 0900 06/03/11 0907   06/01/11 0947   tigecycline (TYGACIL) 50 mg in sodium chloride 0.9 % 100 mL IVPB        50 mg 200 mL/hr over 30 Minutes Intravenous Every  12 hours 05/31/11 2148     05/31/11 2200   tigecycline (TYGACIL) 100 mg in sodium chloride 0.9 % 100 mL IVPB        100 mg 200 mL/hr over 30 Minutes Intravenous  Once 05/31/11 2148 05/31/11 2256          Assessment/Plan: s/p Procedure(s): APPENDECTOMY LAPAROSCOPIC  RLQ abscess drain placed 11/27(IR) Output minimal now Will need reCT soon to eval collection---consider pull Plan per surgery  Nathan [name] 06/14/2011

## 2011-06-15 LAB — CBC
MCV: 89.8 fL (ref 78.0–100.0)
Platelets: 491 10*3/uL — ABNORMAL HIGH (ref 150–400)
RBC: 4.3 MIL/uL (ref 4.22–5.81)
RDW: 13.1 % (ref 11.5–15.5)
WBC: 5.2 10*3/uL (ref 4.0–10.5)

## 2011-06-15 NOTE — Progress Notes (Signed)
Pt examined, agree with above  Mariella Saa MD, FACS  06/15/2011, 2:24 PM

## 2011-06-15 NOTE — Progress Notes (Signed)
14 Days Post-Op  Subjective: S/P right lower quadrant abscess drain placed 06/12/11 Dr. Deanne Coffer  For abscess following appy. Pt. Without complaints except tired.  Objective: Vital signs in last 24 hours: Temp:  [98.3 F (36.8 C)-99 F (37.2 C)] 99 F (37.2 C) (11/30 0600) Pulse Rate:  [63-65] 65  (11/30 0600) Resp:  [16-18] 16  (11/30 0600) BP: (105-108)/(66-68) 105/66 mmHg (11/30 0600) SpO2:  [100 %] 100 % (11/30 0600) Last BM Date: 06/13/11  Intake/Output from previous day: 11/29 0701 - 11/30 0700 In: 1141.8 [P.O.:640; I.V.:491.8] Out: -  Intake/Output this shift: Total I/O In: 120 [P.O.:120] Out: -   Physical - drain site clean with mild erythema at edges of wound.  Lab Results:   Basename 06/15/11 0455 06/13/11 0500  WBC 5.2 7.8  HGB 13.4 12.5*  HCT 38.6* 36.3*  PLT 491* 478*   BMET  Basename 06/14/11 0555 06/13/11 0500  NA 126* --  K 4.1 4.0  CL 94* --  CO2 29 --  GLUCOSE 104* --  BUN 21 --  CREATININE 0.53 --  CALCIUM 7.9* --   PT/INR No results found for this basename: LABPROT:2,INR:2 in the last 72 hours ABG No results found for this basename: PHART:2,PCO2:2,PO2:2,HCO3:2 in the last 72 hours  Studies/Results: No results found.  Anti-infectives: Anti-infectives     Start     Dose/Rate Route Frequency Ordered Stop   06/14/11 1200   ciprofloxacin (CIPRO) tablet 500 mg        500 mg Oral 2 times daily 06/14/11 0942     06/03/11 1200   vancomycin (VANCOCIN) 50 mg/mL oral solution 500 mg  Status:  Discontinued        500 mg Oral 4 times per day 06/03/11 0907 06/03/11 1116   06/03/11 1000   metroNIDAZOLE (FLAGYL) tablet 500 mg  Status:  Discontinued        500 mg Oral 3 times per day 06/03/11 0900 06/03/11 0907   06/01/11 0947   tigecycline (TYGACIL) 50 mg in sodium chloride 0.9 % 100 mL IVPB  Status:  Discontinued        50 mg 200 mL/hr over 30 Minutes Intravenous Every 12 hours 05/31/11 2148 06/14/11 0942   05/31/11 2200   tigecycline  (TYGACIL) 100 mg in sodium chloride 0.9 % 100 mL IVPB  Status:  Discontinued        100 mg 200 mL/hr over 30 Minutes Intravenous  Once 05/31/11 2148 05/31/11 2256          Assessment/Plan: s/p Procedure(s): APPENDECTOMY LAPAROSCOPIC S/p abscess drain as above. Output has been slow. Approx 10 ccs amber fluid in bag now. Pt states CT scan planned for Sunday.  LOS: 15 days    Kameisha Malicki 06/15/2011

## 2011-06-15 NOTE — Progress Notes (Signed)
Patient ID: Nathan [name], male   DOB: [birth year], 65 y.o.   MRN: [id number] 14 Days Post-Op  Subjective: Pt feels MUCH better today after d/c tygacil.  No further nausea.  Ate huge breakfast and feels good!  Objective: Vital signs in last 24 hours: Temp:  [97.4 F (36.3 C)-99 F (37.2 C)] 99 F (37.2 C) (11/30 0600) Pulse Rate:  [59-65] 65  (11/30 0600) Resp:  [16-18] 16  (11/30 0600) BP: (105-113)/(66-70) 105/66 mmHg (11/30 0600) SpO2:  [100 %] 100 % (11/30 0600) Last BM Date: 06/13/11  Intake/Output from previous day: 11/29 0701 - 11/30 0700 In: 1141.8 [P.O.:640; I.V.:491.8] Out: -  Intake/Output this shift:    PE: Abd: soft, NT, minimal distention, +BS, drain with only serous output  Lab Results:   Basename 06/15/11 0455 06/13/11 0500  WBC 5.2 7.8  HGB 13.4 12.5*  HCT 38.6* 36.3*  PLT 491* 478*   BMET  Basename 06/14/11 0555 06/13/11 0500  NA 126* --  K 4.1 4.0  CL 94* --  CO2 29 --  GLUCOSE 104* --  BUN 21 --  CREATININE 0.53 --  CALCIUM 7.9* --   PT/INR  Basename 06/12/11 1027  LABPROT 16.9*  INR 1.35     Studies/Results: No results found.  Anti-infectives: Anti-infectives     Start     Dose/Rate Route Frequency Ordered Stop   06/14/11 1200   ciprofloxacin (CIPRO) tablet 500 mg        500 mg Oral 2 times daily 06/14/11 0942     06/03/11 1200   vancomycin (VANCOCIN) 50 mg/mL oral solution 500 mg  Status:  Discontinued        500 mg Oral 4 times per day 06/03/11 0907 06/03/11 1116   06/03/11 1000   metroNIDAZOLE (FLAGYL) tablet 500 mg  Status:  Discontinued        500 mg Oral 3 times per day 06/03/11 0900 06/03/11 0907   06/01/11 0947   tigecycline (TYGACIL) 50 mg in sodium chloride 0.9 % 100 mL IVPB  Status:  Discontinued        50 mg 200 mL/hr over 30 Minutes Intravenous Every 12 hours 05/31/11 2148 06/14/11 0942   05/31/11 2200   tigecycline (TYGACIL) 100 mg in sodium chloride 0.9 % 100 mL IVPB  Status:  Discontinued        100 mg 200  mL/hr over 30 Minutes Intravenous  Once 05/31/11 2148 05/31/11 2256           Assessment/Plan  1. S/p lap appy for perf appy 2. Post-op abscess, s/p perc drain  Plan: Cont regular diet Anticipate repeat CT scan on Sunday/Monday to eval fluid collection to possible get drain out. Cont cipro.   LOS: 15 days    Larrie Fraizer E 06/15/2011

## 2011-06-16 NOTE — Progress Notes (Signed)
  Subjective: Feeling better. Concerns with drain and possibly discharge with same.    Objective: Vital signs in last 24 hours: Temp:  [97.8 F (36.6 C)-98.4 F (36.9 C)] 98.4 F (36.9 C) (11/30 2106) Pulse Rate:  [65-69] 69  (11/30 2106) Resp:  [18] 18  (11/30 2106) BP: (90-95)/(60) 95/60 mmHg (11/30 2106) SpO2:  [99 %-100 %] 99 % (11/30 2106) Last BM Date: 06/14/11  Intake/Output from previous day: 11/30 0701 - 12/01 0700 In: 850 [P.O.:840] Out: 30 [Drains:30] Intake/Output this shift: Total I/O In: 245 [P.O.:240; Other:5] Out: -    Physical exam : drain intact with mostly serous drainage in bag.  Site clean and dry.  Patient mildly tender at insertion site of drain.  30 ml output total 11/30.  Abdomen soft with positive bowel signs.  Lab Results:   [facility] 06/15/11 0455  WBC 5.2  HGB 13.4  HCT 38.6*  PLT 491*   BMET  Basename 06/14/11 0555  NA 126*  K 4.1  CL 94*  CO2 29  GLUCOSE 104*  BUN 21  CREATININE 0.53  CALCIUM 7.9*      Studies/Results:  Cultures : positive for pseudomonas - on cipro.     Assessment/Plan: s/p percutaneous drainage for abscess post appendectomy placed 11/27.  To continue drain - for CT next day or so. Will follow.     CAMPBELL,PAMELA D, PA-C 06/16/2011

## 2011-06-16 NOTE — Progress Notes (Signed)
15 Days Post-Op  Subjective: Alert. Tolerating diet. No bowel movement for 24 hours. Hungry. No pain.  Almost no drainage from the percutaneous drain last 24 hours. What has drain is only serous.  Objective: Vital signs in last 24 hours: Temp:  [97.3 F (36.3 C)-98.4 F (36.9 C)] 97.3 F (36.3 C) (12/01 1340) Pulse Rate:  [69-77] 77  (12/01 1340) Resp:  [18] 18  (12/01 1340) BP: (95-110)/(60-67) 110/67 mmHg (12/01 1340) SpO2:  [98 %-99 %] 98 % (12/01 1340) Last BM Date: 06/14/11  Intake/Output from previous day: 11/30 0701 - 12/01 0700 In: 850 [P.O.:840] Out: 30 [Drains:30] Intake/Output this shift: Total I/O In: 365 [P.O.:360; Other:5] Out: -   General appearance: alert and appears stated age GI: abdomen is still a little distended but soft and nontender. Drainage is serous but only a few drops in the bag.  Lab Results:  No results found for this or any previous visit (from the past 24 hour(s)).   Studies/Results: @RISRSLT24 @     . acetaminophen  650 mg Oral QID  . ciprofloxacin  500 mg Oral BID  . Flora-Q  1 capsule Oral Daily  . heparin subcutaneous  5,000 Units Subcutaneous Q8H  . lip balm  1 application Topical BID  . ondansetron  4 mg Intravenous Q6H  . PHENobarbital  64.8 mg Oral QHS  . potassium chloride  40 mEq Oral Daily  . vitamin C  500 mg Oral Daily     Assessment/Plan: s/p Procedure(s): APPENDECTOMY LAPAROSCOPIC Postop abscess, status post percutaneous drainage, doing nicely with return of gastrointestinal motility function. Consider CT scan tomorrow or the next day if drainage remains live. Continue antibiotics.       LOS: 16 days    Per Beagley M 06/16/2011  . .prob

## 2011-06-17 ENCOUNTER — Inpatient Hospital Stay (HOSPITAL_COMMUNITY): Payer: BC Managed Care – PPO

## 2011-06-17 MED ORDER — IOHEXOL 300 MG/ML  SOLN
100.0000 mL | Freq: Once | INTRAMUSCULAR | Status: AC | PRN
  Administered 2011-06-17: 100 mL via INTRAVENOUS

## 2011-06-17 NOTE — Progress Notes (Signed)
Patient refused tylenol and heparin. States he isnt having pain and does not want the heparin. Explained the importance of heparin , still refused. States pain level is zero. Had ct scan today, with contrast. Had large bowel movement after ct scan. Bowel sounds positive. Drain still in place.

## 2011-06-17 NOTE — Progress Notes (Signed)
Dr. Biagio Quint on call, notified that patient wanted to know if the drain would be removed tonight. Will not be removed tonight, patient given the information. Refused to have drain flushed at this time, stated they would wait until later tonight. Refused all medications. States is having no pain.

## 2011-06-17 NOTE — Progress Notes (Signed)
16 Days Post-Op  Subjective: NO complaints other than difficulty moving his bowels.  States that he is not having any output from the drain.  Still passing flatus   Objective: Vital signs in last 24 hours: Temp:  [97.3 F (36.3 C)-98.1 F (36.7 C)] 97.8 F (36.6 C) (12/02 0500) Pulse Rate:  [66-84] 66  (12/02 0500) Resp:  [18] 18  (12/02 0500) BP: (98-110)/(59-67) 98/59 mmHg (12/02 0500) SpO2:  [96 %-98 %] 98 % (12/02 0500) Last BM Date: 06/14/11  Intake/Output from previous day: 12/01 0701 - 12/02 0700 In: 615 [P.O.:600] Out: 10 [Drains:10] Intake/Output this shift:    General appearance: alert, cooperative, cachectic and no distress GI: soft, nt, appears mildly distended to me but the patient states that this is his normal abdomen.  nothing in drainage bag. wounds okay.  Lab Results:   [facility] 06/15/11 0455  WBC 5.2  HGB 13.4  HCT 38.6*  PLT 491*   BMET No results found for this basename: NA:2,K:2,CL:2,CO2:2,GLUCOSE:2,BUN:2,CREATININE:2,CALCIUM:2 in the last 72 hours PT/INR No results found for this basename: LABPROT:2,INR:2 in the last 72 hours ABG No results found for this basename: PHART:2,PCO2:2,PO2:2,HCO3:2 in the last 72 hours  Studies/Results: No results found.  Anti-infectives: Anti-infectives     Start     Dose/Rate Route Frequency Ordered Stop   06/14/11 1200   ciprofloxacin (CIPRO) tablet 500 mg        500 mg Oral 2 times daily 06/14/11 0942     06/03/11 1200   vancomycin (VANCOCIN) 50 mg/mL oral solution 500 mg  Status:  Discontinued        500 mg Oral 4 times per day 06/03/11 0907 06/03/11 1116   06/03/11 1000   metroNIDAZOLE (FLAGYL) tablet 500 mg  Status:  Discontinued        500 mg Oral 3 times per day 06/03/11 0900 06/03/11 0907   06/01/11 0947   tigecycline (TYGACIL) 50 mg in sodium chloride 0.9 % 100 mL IVPB  Status:  Discontinued        50 mg 200 mL/hr over 30 Minutes Intravenous Every 12 hours 05/31/11 2148 06/14/11 0942   05/31/11  2200   tigecycline (TYGACIL) 100 mg in sodium chloride 0.9 % 100 mL IVPB  Status:  Discontinued        100 mg 200 mL/hr over 30 Minutes Intravenous  Once 05/31/11 2148 05/31/11 2256          Assessment/Plan: s/p Procedure(s): APPENDECTOMY LAPAROSCOPIC will repeat CT to evaluate resolution of abscess cavity prior to drain removal.  LOS: 17 days    Lodema Pilot DAVID 06/17/2011

## 2011-06-17 NOTE — Progress Notes (Signed)
Patients TPN stopped.  CBG's ordered for q6hr. Order given to discontinue CBG's by Dr Derrell Lolling.

## 2011-06-18 DIAGNOSIS — R188 Other ascites: Secondary | ICD-10-CM

## 2011-06-18 LAB — CULTURE, ROUTINE-ABSCESS

## 2011-06-18 MED ORDER — SULFAMETHOXAZOLE-TRIMETHOPRIM 800-160 MG PO TABS: 1.0000 | ORAL_TABLET | Freq: Two times a day (BID) | ORAL | Status: AC

## 2011-06-18 NOTE — Progress Notes (Signed)
Patient ID: Nathan [name], male   DOB: [birth year], 65 y.o.   MRN: [id number] 17 Days Post-Op  Subjective: Pt feels much better.  Eating "like a horse."  No pain.  Objective: Vital signs in last 24 hours: Temp:  [97.7 F (36.5 C)-97.9 F (36.6 C)] 97.9 F (36.6 C) (12/03 0415) Pulse Rate:  [69-96] 71  (12/03 0415) Resp:  [16-18] 18  (12/03 0415) BP: (104-112)/(67-75) 104/70 mmHg (12/03 0415) SpO2:  [96 %-100 %] 98 % (12/03 0415) Last BM Date: 06/17/11  Intake/Output from previous day: 12/02 0701 - 12/03 0700 In: 610 [P.O.:600] Out: 1 [Stool:1] Intake/Output this shift: Total I/O In: 240 [P.O.:240] Out: -   PE: Abd: soft, mild distention, +BS, essentially nontender.  Drain with only serous output.  Lab Results:  No results found for this basename: WBC:2,HGB:2,HCT:2,PLT:2 in the last 72 hours BMET No results found for this basename: NA:2,K:2,CL:2,CO2:2,GLUCOSE:2,BUN:2,CREATININE:2,CALCIUM:2 in the last 72 hours PT/INR No results found for this basename: LABPROT:2,INR:2 in the last 72 hours   Studies/Results: Ct Abdomen Pelvis W Contrast  06/17/2011  *RADIOLOGY REPORT*  Clinical Data: Percutaneous drainage of intra-abdominal abscess. Follow-up.  CT ABDOMEN AND PELVIS WITH CONTRAST  Technique:  Multidetector CT imaging of the abdomen and pelvis was performed following the standard protocol during bolus administration of intravenous contrast.  Contrast: OMNIPAQUE IOHEXOL 300 MG/ML IV SOLN  Comparison: 06/12/2011  Findings: Right lower quadrant anterior percutaneous drainage catheter is in place.  Decreasing size of the right lower quadrant abscess.  A small amount of residual fluid just inferior to the drain, measuring 2.8 x 1.8 cm.  Overall, the fluid collection is decreased significantly.  There is a small right pleural effusion.  Right lower lobe atelectasis.  Left lung bases clear.  Heart is normal size.  Small amount of perihepatic free fluid.  Liver, spleen, pancreas,  adrenals are unremarkable.  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  No hydronephrosis.  Urinary bladder, large and small bowel are grossly unremarkable.  Large stool burden throughout the colon.  Aorta is normal caliber.  IMPRESSION: Decreasing size of right lower quadrant abscess with drainage catheter in place.  A small amount of residual fluid remains present just inferior to the drainage catheter.  Large stool burden.  Decreasing size of the right pleural effusion since prior study. Continued small amount of perihepatic ascites.  Original Report Authenticated By: Cyndie Chime, M.D.    Anti-infectives: Anti-infectives     Start     Dose/Rate Route Frequency Ordered Stop   06/14/11 1200   ciprofloxacin (CIPRO) tablet 500 mg  Status:  Discontinued        500 mg Oral 2 times daily 06/14/11 0942 06/18/11 1059   06/03/11 1200   vancomycin (VANCOCIN) 50 mg/mL oral solution 500 mg  Status:  Discontinued        500 mg Oral 4 times per day 06/03/11 0907 06/03/11 1116   06/03/11 1000   metroNIDAZOLE (FLAGYL) tablet 500 mg  Status:  Discontinued        500 mg Oral 3 times per day 06/03/11 0900 06/03/11 0907   06/01/11 0947   tigecycline (TYGACIL) 50 mg in sodium chloride 0.9 % 100 mL IVPB  Status:  Discontinued        50 mg 200 mL/hr over 30 Minutes Intravenous Every 12 hours 05/31/11 2148 06/14/11 0942   05/31/11 2200   tigecycline (TYGACIL) 100 mg in sodium chloride 0.9 % 100 mL IVPB  Status:  Discontinued        100 mg 200 mL/hr over 30 Minutes Intravenous  Once 05/31/11 2148 05/31/11 2256           Assessment/Plan  1. S/p lap appy for perf appy 2. Prolonged post op ileus 3. Post op fluid collection  Plan: D/c drain D/c picc D/c home with 10 days of Bactrim. Follow up with Dr. Ezzard Standing in 2 weeks   LOS: 18 days    [name] E 06/18/2011

## 2011-06-18 NOTE — Progress Notes (Signed)
17 Days Post-Op  Subjective: Patient doing well; awaiting d/c home.  Objective: Vital signs in last 24 hours: Temp:  [97.7 F (36.5 C)-97.9 F (36.6 C)] 97.9 F (36.6 C) (12/03 0415) Pulse Rate:  [69-96] 71  (12/03 0415) Resp:  [16-18] 18  (12/03 0415) BP: (104-112)/(67-75) 104/70 mmHg (12/03 0415) SpO2:  [96 %-100 %] 98 % (12/03 0415) Last BM Date: 06/17/11  Intake/Output from previous day: 12/02 0701 - 12/03 0700 In: 610 [P.O.:600] Out: 1 [Stool:1] Intake/Output this shift: Total I/O In: 240 [P.O.:240] Out: -   Right pelvic drain intact, output minimal, insertion site ok , mildly tender; per CCS order, right pelvic drain removed in its entirety without immediate complications and gauze dressing applied to site.  Lab Results:  No results found for this basename: WBC:2,HGB:2,HCT:2,PLT:2 in the last 72 hours BMET No results found for this basename: NA:2,K:2,CL:2,CO2:2,GLUCOSE:2,BUN:2,CREATININE:2,CALCIUM:2 in the last 72 hours PT/INR No results found for this basename: LABPROT:2,INR:2 in the last 72 hours ABG No results found for this basename: PHART:2,PCO2:2,PO2:2,HCO3:2 in the last 72 hours  Studies/Results: Ct Abdomen Pelvis W Contrast  06/17/2011  *RADIOLOGY REPORT*  Clinical Data: Percutaneous drainage of intra-abdominal abscess. Follow-up.  CT ABDOMEN AND PELVIS WITH CONTRAST  Technique:  Multidetector CT imaging of the abdomen and pelvis was performed following the standard protocol during bolus administration of intravenous contrast.  Contrast: OMNIPAQUE IOHEXOL 300 MG/ML IV SOLN  Comparison: 06/12/2011  Findings: Right lower quadrant anterior percutaneous drainage catheter is in place.  Decreasing size of the right lower quadrant abscess.  A small amount of residual fluid just inferior to the drain, measuring 2.8 x 1.8 cm.  Overall, the fluid collection is decreased significantly.  There is a small right pleural effusion.  Right lower lobe atelectasis.  Left lung  bases clear.  Heart is normal size.  Small amount of perihepatic free fluid.  Liver, spleen, pancreas, adrenals are unremarkable.  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  No hydronephrosis.  Urinary bladder, large and small bowel are grossly unremarkable.  Large stool burden throughout the colon.  Aorta is normal caliber.  IMPRESSION: Decreasing size of right lower quadrant abscess with drainage catheter in place.  A small amount of residual fluid remains present just inferior to the drainage catheter.  Large stool burden.  Decreasing size of the right pleural effusion since prior study. Continued small amount of perihepatic ascites.  Original Report Authenticated By: Cyndie Chime, M.D.   Results for orders placed during the hospital encounter of 05/31/11  CULTURE, BLOOD (ROUTINE X 2)     Status: Normal   Collection Time   06/02/11 11:49 AM      Component Value Range Status Comment   Specimen Description BLOOD RIGHT ARM   Final    Special Requests     Final    Value: BOTTLES DRAWN AEROBIC AND ANAEROBIC 5CC BOTH BOTTLES   Setup Time [id number]   Final    Culture NO GROWTH 5 DAYS   Final    Report Status 06/08/2011 FINAL   Final   CULTURE, BLOOD (ROUTINE X 2)     Status: Normal   Collection Time   06/02/11 11:50 AM      Component Value Range Status Comment   Specimen Description BLOOD  RIGHT ARM   Final    Special Requests BOTTLES DRAWN AEROBIC AND ANAEROBIC 3CC   Final    Setup Time [id number]   Final    Culture NO GROWTH 5  DAYS   Final    Report Status 06/08/2011 FINAL   Final   CLOSTRIDIUM DIFFICILE BY PCR     Status: Normal   Collection Time   06/02/11  8:11 PM      Component Value Range Status Comment   C difficile by pcr NEGATIVE  NEGATIVE  Final   CULTURE, ROUTINE-ABSCESS     Status: Normal   Collection Time   06/12/11  3:39 PM      Component Value Range Status Comment   Specimen Description PERITONEAL CAVITY   Final    Special Requests NONE    Final    Gram Stain     Final    Value: ABUNDANT WBC PRESENT,BOTH PMN AND MONONUCLEAR     NO SQUAMOUS EPITHELIAL CELLS SEEN     FEW GRAM NEGATIVE RODS   Culture     Final    Value: FEW PSEUDOMONAS AERUGINOSA     MODERATE BACTEROIDES CACCAE     Note: BETA LACTAMASE POSITIVE     FEW EIKENELLA CORRODENS     Note: Usually susceptible to penicillin and other beta lactam agents,quinolones,macrolides and tetracyclines.   Report Status 06/18/2011 FINAL   Final    Organism ID, Bacteria PSEUDOMONAS AERUGINOSA   Final     Anti-infectives: Anti-infectives     Start     Dose/Rate Route Frequency Ordered Stop   06/18/11 0000  sulfamethoxazole-trimethoprim (BACTRIM DS) 800-160 MG per tablet       1 tablet Oral 2 times daily 06/18/11 1105 06/28/11 2359   06/14/11 1200   ciprofloxacin (CIPRO) tablet 500 mg  Status:  Discontinued        500 mg Oral 2 times daily 06/14/11 0942 06/18/11 1059   06/03/11 1200   vancomycin (VANCOCIN) 50 mg/mL oral solution 500 mg  Status:  Discontinued        500 mg Oral 4 times per day 06/03/11 0907 06/03/11 1116   06/03/11 1000   metroNIDAZOLE (FLAGYL) tablet 500 mg  Status:  Discontinued        500 mg Oral 3 times per day 06/03/11 0900 06/03/11 0907   06/01/11 0947   tigecycline (TYGACIL) 50 mg in sodium chloride 0.9 % 100 mL IVPB  Status:  Discontinued        50 mg 200 mL/hr over 30 Minutes Intravenous Every 12 hours 05/31/11 2148 06/14/11 0942   05/31/11 2200   tigecycline (TYGACIL) 100 mg in sodium chloride 0.9 % 100 mL IVPB  Status:  Discontinued        100 mg 200 mL/hr over 30 Minutes Intravenous  Once 05/31/11 2148 05/31/11 2256          Assessment/Plan: S/p right pelvic abscess drainage 11/27; drain removed today; plans as per CCS.         LOS: 18 days    ALLRED,D [facility] 06/18/2011

## 2011-06-18 NOTE — Progress Notes (Signed)
Agree with above 

## 2011-06-18 NOTE — Progress Notes (Signed)
Spoke w/ Dr. Donell Beers regarding Cipro vs Bactrim (Based on Pharmacy notes) will send pt home on Bactrim due to possible allergic rxn previously to Cipro. Pt developed a red rash.

## 2011-06-18 NOTE — Discharge Summary (Signed)
Agree with above 

## 2011-06-18 NOTE — Progress Notes (Signed)
MD, please be aware of peritoneal fluid culture growing Pseudomonas aeruginosa.  It is susceptible to cefepime, ceftazidime, cipro, gent, tobra, imipenem.  Recommend Cipro 500mg  PO BID upon discharge rather than Septra.  Thanks,  Charolotte Eke, PharmD, pager [phone]. 06/18/2011,12:11 PM.

## 2011-06-18 NOTE — Discharge Summary (Signed)
Patient ID: Nathan [name] MRN: [id number] DOB/AGE: 1945-12-14 65 y.o.  Admit date: 05/31/2011 Discharge date: 06/18/2011  Procedures: lap appy by Dr. Ezzard Standing   Consults: none  Reason for Admission:  This is a 65 yo male who presented to the [facility] with RLQ pain that started the day prior to admission.  No vomiting.  Upon arrival he had a CT scan which revealed acute perforated appendicitis.    Admission Diagnoses: 1. Acute perforated appendicitis 2. RLS 3. H/o seizures  Hospital Course: The patient was admitted.  He went to the operating room where he had an appendectomy and some debridement of his mesh from a RIH repair 2 years ago.  The patient tolerated the procedure well.  Post operatively the patient struggled with a prolonged post-operative ileus.  He began having multiple bowel movements and was C. Diff negative, but was having trouble tolerating POs.  He had a PICC line placed and was started on TNA.  He did have a CT scan on POD# 4 which revealed a small RLQ fluid collection.  He was continued on his Tygacil.  After several more days of continued nausea, the patient's abdomen did begin to have more bowel sounds.  A repeat CT scan was then again ordered on POD # 10, despite a normal WBC and no fevers.  This revealed a larger fluid collection which was amendable to percutaneous drainage.  This was done.  The patient still had nausea so his Tygacil was stopped and he was started on PO Cipro.  His nausea then completely resolved and he began eating and having normal bowel movements.  He then felt much better and began to turn the corner.  After 5 days with the drain a CT scan revealed an almost resolved fluid collection.  He was only have flush return in his drain so this was discontinued and the patient was felt stable for d/c home on POD # 17.  Discharge Diagnoses:  Principal Problem:  *Ileus, postoperative Active Problems:  RESTLESS LEGS SYNDROME  SEIZURE DISORDER  DYSPNEA  Appendicitis  with abscess  Fluid overload  Intra-abdominal fluid collection   Discharge Medications: Current Discharge Medication List    START taking these medications   Details  sulfamethoxazole-trimethoprim (BACTRIM DS) 800-160 MG per tablet Take 1 tablet by mouth 2 (two) times daily. Qty: 20 tablet, Refills: 0      CONTINUE these medications which have NOT CHANGED   Details  cholecalciferol (VITAMIN D) 1000 UNITS tablet Take 1,000 Units by mouth daily.      Cod Liver Oil OIL Take 5 mLs by mouth daily.      Coenzyme Q10 (CO Q 10) 60 MG CAPS Take 1 tablet by mouth daily.      Homeopathic Products (SIMILASAN DRY EYE RELIEF) SOLN Apply 1 drop to eye daily as needed. Dry Eyes     Magnesium 400 MG CAPS Take 1 capsule by mouth daily.      PHENObarbital (LUMINAL) 60 MG tablet Take 60 mg by mouth at bedtime.      vitamin C (ASCORBIC ACID) 500 MG tablet Take 500 mg by mouth 3 (three) times daily.          Discharge Instructions: Follow-up Information    Follow up with NEWMAN,DAVID H, MD in 2 weeks.   Contact information:   [employer] [address], [address] [place] [address] [phone]          Signed: Letha Cape 06/18/2011, 11:06  AM

## 2011-06-19 ENCOUNTER — Telehealth (INDEPENDENT_AMBULATORY_CARE_PROVIDER_SITE_OTHER): Payer: Self-pay | Admitting: Surgery

## 2011-06-21 ENCOUNTER — Encounter (INDEPENDENT_AMBULATORY_CARE_PROVIDER_SITE_OTHER): Payer: Self-pay | Admitting: Surgery

## 2011-06-21 ENCOUNTER — Ambulatory Visit (INDEPENDENT_AMBULATORY_CARE_PROVIDER_SITE_OTHER): Payer: BC Managed Care – PPO | Admitting: Surgery

## 2011-06-21 VITALS — BP 122/78 | HR 64 | Temp 97.6°F | Resp 18 | Ht 72.0 in | Wt 138.8 lb

## 2011-06-21 DIAGNOSIS — K3533 Acute appendicitis with perforation and localized peritonitis, with abscess: Secondary | ICD-10-CM

## 2011-06-21 NOTE — Progress Notes (Signed)
[facility]  Ovidio Kin, MD,  FACS [address] [place]  [address] [place], [place]    [address] Phone:  [phone] FAX:  [phone]   Re:   Nathan [name] DOB:   15-Dec-1945 MRN:   [id number]  ASSESSMENT AND PLAN: 1.  Ruptured appendicitis - 06/11/2011.  Attached to Prolene mesh.  Hospitalized 11/15 - 06/18/2011.  Required perc drain 06/12/2011 - 06/18/2011.  He is actually doing well (finally).  He will keep an appointment to see me on 07/04/2011.  2.  Post op ileus. 3.  History of seizures. 4.  Restless leg syndrome.  HISTORY OF PRESENT ILLNESS: Chief Complaint  Patient presents with  . Routine Post Op    PO reck appy... pt having redness, swelling and drainage from incision site    Nathan [name] is a 65 y.o. (DOB: [birth year])  white male who is a patient of Kristian Covey, MD and comes to me today for appendectomy follow up.   Since he left the hospital Monday (12/3), he noticed some sweling around drain site.  His eating better and his bowels are working better.  His wife is with him.  He thinks he lost about 12 pounds during his hospitalization.  PHYSICAL EXAM: BP 122/78  Pulse 64  Temp(Src) 97.6 F (36.4 C) (Temporal)  Resp 18  Ht 6' (1.829 m)  Wt 138 lb 12.8 oz (62.959 kg)  BMI 18.82 kg/m2  General: Thin WM who is alert.  HEENT: Normal. Pupils equal. Good dentition. Neck: Supple. No mass.  No thyroid mass.  Carotid pulse okay with no bruit.  Lungs: Clear to auscultation and symmetric breath sounds. Heart:  RRR. No murmur or rub. Abdomen: Soft. No mass.  Normal bowel sounds. Scars of a lap appendectomy.  Drain site is right over his palpable right groin mesh.  Minimal swelling at drain site but no gross infection.  Extremities:  Good strength and ROM  in upper and lower extremities. Neurologic:  Grossly intact to motor and sensory function. Psychiatric: Has normal mood and affect. Behavior is normal.    DATA REVIEWED: Path report  to patient.   Ovidio Kin, MD, FACS Office:  [phone]

## 2011-06-21 NOTE — Patient Instructions (Signed)
1.  Keep appointment Dec. 19.  2.  May shower and wash wounds.  3.  Continue antibiotics till finished.

## 2011-06-25 ENCOUNTER — Ambulatory Visit (INDEPENDENT_AMBULATORY_CARE_PROVIDER_SITE_OTHER): Payer: BC Managed Care – PPO | Admitting: Surgery

## 2011-06-25 ENCOUNTER — Encounter (INDEPENDENT_AMBULATORY_CARE_PROVIDER_SITE_OTHER): Payer: Self-pay | Admitting: Surgery

## 2011-06-25 VITALS — BP 122/68 | HR 60 | Temp 97.8°F | Resp 16 | Ht 72.0 in | Wt 137.4 lb

## 2011-06-25 DIAGNOSIS — K3533 Acute appendicitis with perforation and localized peritonitis, with abscess: Secondary | ICD-10-CM

## 2011-06-25 NOTE — Progress Notes (Signed)
[facility]  Ovidio Kin, MD,  FACS [address] [place]  [address] [place], [place]    [address] Phone:  [phone] FAX:  [phone]   Re:   Nathan [name] DOB:   Sep 20, 1945 MRN:   [id number]  ASSESSMENT AND PLAN: 1.  Ruptured appendicitis - 06/11/2011.  Attached to Prolene mesh that was used to fix right inguinal hernia.  Hospitalized 11/15 - 06/18/2011.  Required perc drain 06/12/2011 - 06/18/2011.  He had 4 questions which I reviewed in my HPI.  He is going to see me back in 4 weeks.  2.  Post op ileus. 3.  History of seizures. 4.  Restless leg syndrome. 5.  Urine discomfort.  If this continues, he will call next week and we will get a UA and Urine for C&S.  HISTORY OF PRESENT ILLNESS: Chief Complaint  Patient presents with  . Routine Post Op    pt has hard knot at drain site     Nathan [name] is a 65 y.o. (DOB: 05-Sep-1945)  white male who is a patient of Kristian Covey, MD and comes to me today for appendectomy follow up.  Since he left the hospital Monday (12/3), he noticed some sweling around drain site.  He is accompanied with his wife.  He returns on short notice for 4 things:  1.  He had headaches this past weekend and stopped the Septra.  It sounds like he talked to Dr. Johna Sheriff about this.  The headache has resolved.  He does not need any more antibiotics.  2.  He has dry skin around his buttocks.  This should get better.  It is probably multifactorial - nutrition, inactivity, exposure to antibiotics and meds. I told him to use a cream with aloe, take a MVI, and this should improve.  3.  He has a bulge in the right groin.  This is the right groin mesh.  It is unchanged from my exams in the hospital.  It may be more noticeable to him because he lost about 12 pounds during his hospitalization.  4.  He has some discomfort urinating.  His urine looks and smell okay to him.  He'll call if this is persistent next week. If symptoms persist, will  check a UA and urine for C&S.  PHYSICAL EXAM: BP 122/68  Pulse 60  Temp(Src) 97.8 F (36.6 C) (Temporal)  Resp 16  Ht 6' (1.829 m)  Wt 137 lb 6 oz (62.313 kg)  BMI 18.63 kg/m2  General: Thin WM who is alert.  HEENT: Normal. Pupils equal. Abdomen: Soft. No mass.  Normal bowel sounds. Scars of a lap appendectomy.  Right lower quadrant (inguinal) mesh is palpable.  But this unchanged from my exams in the hospital.  Overall, his incisions look better than when I saw him last week.  Extremities:  Desquamation of skin around buttocks. Neurologic:  Grossly intact to motor and sensory function. Psychiatric: Has normal mood and affect. Behavior is normal.    DATA REVIEWED: No new data.  Ovidio Kin, MD, FACS Office:  [phone]

## 2011-06-25 NOTE — Patient Instructions (Signed)
1.  If you are still having urine symptoms next week, call our office and we can check your urine.  My nurse is Marcelino Duster [phone]).  2.  Otherwise I will see you in 4 weeks.

## 2011-07-04 ENCOUNTER — Encounter (INDEPENDENT_AMBULATORY_CARE_PROVIDER_SITE_OTHER): Payer: BC Managed Care – PPO | Admitting: Surgery

## 2011-08-02 ENCOUNTER — Encounter (INDEPENDENT_AMBULATORY_CARE_PROVIDER_SITE_OTHER): Payer: Self-pay | Admitting: Surgery

## 2011-08-02 ENCOUNTER — Ambulatory Visit (INDEPENDENT_AMBULATORY_CARE_PROVIDER_SITE_OTHER): Payer: BC Managed Care – PPO | Admitting: Surgery

## 2011-08-02 VITALS — BP 126/72 | HR 68 | Temp 97.8°F | Resp 18 | Ht 72.0 in | Wt 151.0 lb

## 2011-08-02 DIAGNOSIS — K3533 Acute appendicitis with perforation and localized peritonitis, with abscess: Secondary | ICD-10-CM

## 2011-08-02 NOTE — Progress Notes (Signed)
[facility]  Nathan Kin, MD,  FACS [address] [place] [address]  [address] [place], [place]    [address] Phone:  [phone] FAX:  [phone]   Re:   Nathan [name] DOB:   May 19, 1946 MRN:   [id number]  ASSESSMENT AND PLAN: 1.  Ruptured appendicitis - 06/11/2011.  Attached to Prolene mesh that was used to fix right inguinal hernia.  Hospitalized 11/15 - 06/18/2011.  Required perc drain 06/12/2011 - 06/18/2011.  Has gained weight back - now at 151 pounds.  Abdomen lax, but this should get better as he increases his exercise.  Some twinges down his right scrotum/testicle.  This could be coming from the mesh.  But I do not think the mesh is infected.  It may cause some local discomfort because of the contracture of the mesh.  I made his appointment PRN, but would be happy to see him if he has more trouble.  2.  Post op ileus. 3.  History of seizures. 4.  Restless leg syndrome. 5.  Urine discomfort - this is better.  HISTORY OF PRESENT ILLNESS: Chief Complaint  Patient presents with  . Post-op Problem    Hard knot at drain site    Nathan [name] is a 66 y.o. (DOB: May 24, 1946)  white male who is a patient of Kristian Covey, MD, MD and comes to me today for appendectomy follow up. He comes with his wife.   He has several questions: 1.  He has gained most of his weight back since the hospitalization.  He was down to 136 pounds when he left the hospital.  This is a good overall sign that he is recovering appropriately. 2.  On his right side/right groin, he has some pain that goes down to his right testicle/scrotum.  This could be due to the mesh. 3.  He is worried about the right inguinal mesh getting infected.  I tried to reassure him that I think this will not happen.  I discussed this early after surgery, because the appendix was stuck to the mesh.  But I would have expected him to have a recurrent RLQ abscess if this were the case and that has not happened. 4.  He notices  that his abdomen protrudes more than he remembers.  I think, because he was so sick, that he has lost some muscle tone in his abdominal wall.  He does crunches and I encouraged him to restart these.  That with time (3 to 6 months), his abdominal tone will improve and it will protrude less.  PHYSICAL EXAM: BP 126/72  Pulse 68  Temp(Src) 97.8 F (36.6 C) (Temporal)  Resp 18  Ht 6' (1.829 m)  Wt 151 lb (68.493 kg)  BMI 20.48 kg/m2  General: Thin WM who is alert.  HEENT: Normal. Pupils equal. Abdomen: Soft. No mass.  Normal bowel sounds. Scars of a lap appendectomy.  Right lower quadrant (inguinal) mesh is palpable. I see nothing abnormal on his exam.  DATA REVIEWED: No new data.  Nathan Kin, MD, FACS Office:  [phone]

## 2011-09-10 ENCOUNTER — Other Ambulatory Visit: Payer: Self-pay | Admitting: *Deleted

## 2011-09-10 MED ORDER — PHENOBARBITAL 60 MG PO TABS: 60.0000 mg | ORAL_TABLET | Freq: Every day | ORAL | Status: DC

## 2011-09-11 ENCOUNTER — Telehealth: Payer: Self-pay | Admitting: Family Medicine

## 2011-09-11 NOTE — Telephone Encounter (Signed)
Rx faxed, confirmation received.

## 2011-09-11 NOTE — Telephone Encounter (Signed)
Returning Nancy's call.Marland KitchenMarland KitchenMarland KitchenWants prescription [facility]

## 2011-11-12 ENCOUNTER — Encounter: Payer: Self-pay | Admitting: Family Medicine

## 2011-11-12 ENCOUNTER — Ambulatory Visit (INDEPENDENT_AMBULATORY_CARE_PROVIDER_SITE_OTHER): Payer: BC Managed Care – PPO | Admitting: Family Medicine

## 2011-11-12 VITALS — BP 115/68 | Temp 98.3°F | Wt 155.0 lb

## 2011-11-12 DIAGNOSIS — R599 Enlarged lymph nodes, unspecified: Secondary | ICD-10-CM

## 2011-11-12 DIAGNOSIS — R590 Localized enlarged lymph nodes: Secondary | ICD-10-CM

## 2011-11-12 NOTE — Patient Instructions (Signed)
Lymphadenopathy Lymphadenopathy means "disease of the lymph glands." But the term is usually used to describe swollen or enlarged lymph glands, also called lymph nodes. These are the bean-shaped organs found in many locations including the neck, underarm, and groin. Lymph glands are part of the immune system, which fights infections in your body. Lymphadenopathy can occur in just one area of the body, such as the neck, or it can be generalized, with lymph node enlargement in several areas. The nodes found in the neck are the most common sites of lymphadenopathy. CAUSES  When your immune system responds to germs (such as viruses or bacteria ), infection-fighting cells and fluid build up. This causes the glands to grow in size. This is usually not something to worry about. Sometimes, the glands themselves can become infected and inflamed. This is called lymphadenitis. Enlarged lymph nodes can be caused by many diseases:  Bacterial disease, such as strep throat or a skin infection.   Viral disease, such as a common cold.   Other germs, such as lyme disease, tuberculosis, or sexually transmitted diseases.   Cancers, such as lymphoma (cancer of the lymphatic system) or leukemia (cancer of the white blood cells).   Inflammatory diseases such as lupus or rheumatoid arthritis.   Reactions to medications.  Many of the diseases above are rare, but important. This is why you should see your caregiver if you have lymphadenopathy. SYMPTOMS   Swollen, enlarged lumps in the neck, back of the head or other locations.   Tenderness.   Warmth or redness of the skin over the lymph nodes.   Fever.  DIAGNOSIS  Enlarged lymph nodes are often near the source of infection. They can help healthcare providers diagnose your illness. For instance:   Swollen lymph nodes around the jaw might be caused by an infection in the mouth.   Enlarged glands in the neck often signal a throat infection.   Lymph nodes that  are swollen in more than one area often indicate an illness caused by a virus.  Your caregiver most likely will know what is causing your lymphadenopathy after listening to your history and examining you. Blood tests, x-rays or other tests may be needed. If the cause of the enlarged lymph node cannot be found, and it does not go away by itself, then a biopsy may be needed. Your caregiver will discuss this with you. TREATMENT  Treatment for your enlarged lymph nodes will depend on the cause. Many times the nodes will shrink to normal size by themselves, with no treatment. Antibiotics or other medicines may be needed for infection. Only take over-the-counter or prescription medicines for pain, discomfort or fever as directed by your caregiver. HOME CARE INSTRUCTIONS  Swollen lymph glands usually return to normal when the underlying medical condition goes away. If they persist, contact your health-care provider. He/she might prescribe antibiotics or other treatments, depending on the diagnosis. Take any medications exactly as prescribed. Keep any follow-up appointments made to check on the condition of your enlarged nodes.  SEEK MEDICAL CARE IF:   Swelling lasts for more than two weeks.   You have symptoms such as weight loss, night sweats, fatigue or fever that does not go away.   The lymph nodes are hard, seem fixed to the skin or are growing rapidly.   Skin over the lymph nodes is red and inflamed. This could mean there is an infection.  SEEK IMMEDIATE MEDICAL CARE IF:   Fluid starts leaking from the area of the   enlarged lymph node.   You develop a fever of 102 F (38.9 C) or greater.   Severe pain develops (not necessarily at the site of a large lymph node).   You develop chest pain or shortness of breath.   You develop worsening abdominal pain.  MAKE SURE YOU:   Understand these instructions.   Will watch your condition.   Will get help right away if you are not doing well or get  worse.  Document Released: 04/10/2008 Document Revised: 06/21/2011 Document Reviewed: 04/10/2008 [employer] Patient Information 2012 [employer]. 

## 2011-11-12 NOTE — Progress Notes (Signed)
  Subjective:    Patient ID: Nathan [name], male    DOB: 1946-02-11, 66 y.o.   MRN: [id number]  HPI  Right inguinal adenopathy. First noted about 2 weeks ago. He had complicated appendicitis with abscess back in November. Previous inguinal hernia repair on the right side. He had minimal swelling and actually lymph nodes right inguinal region may be slightly smaller in size compared to one week ago. He has not any rashes. No appetite or weight changes. Denies any fever or night sweats. No skin lesions.  Has not noted any other generalized adenopathy. Overall feels well. Exercise stamina is improving.   Review of Systems  Constitutional: Negative for fever, chills, diaphoresis, appetite change, fatigue and unexpected weight change.  Respiratory: Negative for shortness of breath.   Cardiovascular: Negative for chest pain.  Gastrointestinal: Negative for abdominal pain.  Musculoskeletal: Negative for myalgias and arthralgias.  Skin: Negative for rash.  Hematological: Does not bruise/bleed easily.       Objective:   Physical Exam  Constitutional: He appears well-developed and well-nourished.  Neck: Neck supple.  Cardiovascular: Normal rate and regular rhythm.   Pulmonary/Chest: Effort normal and breath sounds normal. No respiratory distress. He has no wheezes. He has no rales.  Abdominal: Soft. There is no tenderness.  Musculoskeletal: He exhibits no edema.       Patient has a couple of very small right inguinal lymph nodes which are movable and essentially nontender. These are less than 1/2 cm diameter.   Lymphadenopathy:    He has no cervical adenopathy.  Skin: No rash noted.       No right lower extremity skin lesions are noted          Assessment & Plan:  Benign right inguinal lymphadenopathy. Reassurance given. Suspect these are reactive nodes. Follow up promptly if they're increasing in size of the any other adenopathy or other worrisome symptoms which were reviewed to watch  out for.

## 2012-01-24 ENCOUNTER — Other Ambulatory Visit (INDEPENDENT_AMBULATORY_CARE_PROVIDER_SITE_OTHER): Payer: Medicare Other

## 2012-01-24 DIAGNOSIS — Z0000 Encounter for general adult medical examination without abnormal findings: Secondary | ICD-10-CM

## 2012-01-24 LAB — LIPID PANEL
Cholesterol: 158 mg/dL (ref 0–200)
LDL Cholesterol: 77 mg/dL (ref 0–99)
VLDL: 4.6 mg/dL (ref 0.0–40.0)

## 2012-01-24 LAB — POCT URINALYSIS DIPSTICK
Bilirubin, UA: NEGATIVE
Blood, UA: NEGATIVE
Glucose, UA: NEGATIVE
Ketones, UA: NEGATIVE
Leukocytes, UA: NEGATIVE
Nitrite, UA: NEGATIVE
Protein, UA: NEGATIVE
Spec Grav, UA: 1.015
Urobilinogen, UA: 0.2
pH, UA: 6.5

## 2012-01-24 LAB — CBC WITH DIFFERENTIAL/PLATELET
Basophils Absolute: 0 K/uL (ref 0.0–0.1)
Basophils Relative: 1.4 % (ref 0.0–3.0)
Eosinophils Absolute: 0 K/uL (ref 0.0–0.7)
Eosinophils Relative: 1 % (ref 0.0–5.0)
HCT: 40.1 % (ref 39.0–52.0)
Hemoglobin: 13.6 g/dL (ref 13.0–17.0)
Lymphocytes Relative: 42.4 % (ref 12.0–46.0)
Lymphs Abs: 1.2 K/uL (ref 0.7–4.0)
MCHC: 34 g/dL (ref 30.0–36.0)
MCV: 96 fl (ref 78.0–100.0)
Monocytes Absolute: 0.5 K/uL (ref 0.1–1.0)
Monocytes Relative: 16.8 % — ABNORMAL HIGH (ref 3.0–12.0)
Neutro Abs: 1.1 K/uL — ABNORMAL LOW (ref 1.4–7.7)
Neutrophils Relative %: 38.4 % — ABNORMAL LOW (ref 43.0–77.0)
Platelets: 145 K/uL — ABNORMAL LOW (ref 150.0–400.0)
RBC: 4.18 Mil/uL — ABNORMAL LOW (ref 4.22–5.81)
RDW: 13.7 % (ref 11.5–14.6)
WBC: 2.8 K/uL — ABNORMAL LOW (ref 4.5–10.5)

## 2012-01-24 LAB — PSA: PSA: 0.41 ng/mL (ref 0.10–4.00)

## 2012-01-24 LAB — BASIC METABOLIC PANEL
BUN: 11 mg/dL (ref 6–23)
Creatinine, Ser: 0.7 mg/dL (ref 0.4–1.5)
GFR: 128.76 mL/min (ref >60.00)

## 2012-01-24 LAB — HEPATIC FUNCTION PANEL
ALT: 25 U/L (ref 0–53)
AST: 29 U/L (ref 0–37)
Alkaline Phosphatase: 58 U/L (ref 39–117)
Bilirubin, Direct: 0.1 mg/dL (ref 0.0–0.3)
Total Bilirubin: 0.7 mg/dL (ref 0.3–1.2)

## 2012-01-31 ENCOUNTER — Ambulatory Visit (INDEPENDENT_AMBULATORY_CARE_PROVIDER_SITE_OTHER): Payer: Medicare Other | Admitting: Family Medicine

## 2012-01-31 ENCOUNTER — Encounter: Payer: Self-pay | Admitting: Family Medicine

## 2012-01-31 VITALS — BP 110/64 | HR 60 | Temp 98.5°F | Resp 12 | Ht 71.0 in | Wt 152.0 lb

## 2012-01-31 DIAGNOSIS — Z0000 Encounter for general adult medical examination without abnormal findings: Secondary | ICD-10-CM

## 2012-01-31 DIAGNOSIS — D72819 Decreased white blood cell count, unspecified: Secondary | ICD-10-CM

## 2012-01-31 MED ORDER — PHENOBARBITAL 60 MG PO TABS: 60.0000 mg | ORAL_TABLET | Freq: Every day | ORAL | Status: DC

## 2012-01-31 NOTE — Patient Instructions (Addendum)
Return in one month for repeat CBC

## 2012-01-31 NOTE — Progress Notes (Addendum)
Subjective:    Patient ID: Nathan [name], male    DOB: 1946/06/21, 66 y.o.   MRN: [id number]  HPI  Here for complete physical. Patient had complicated appendicitis last year. Has done well since then. Seizure disorder treated with phenobarbital. No seizures in several years. Exercises regularly. Has gained weight back and has good appetite. Immunizations are up to date. Last colonoscopy 2004. He has had occasionally some smaller caliber stools recently but no bloody stools.  Past Medical History  Diagnosis Date  . DYSPNEA 02/09/2009  . RESTLESS LEGS SYNDROME 02/28/2009  . SEIZURE DISORDER 02/09/2009   Past Surgical History  Procedure Date  . Laparoscopic appendectomy 06/01/2011    Procedure: APPENDECTOMY LAPAROSCOPIC;  Surgeon: Kandis Cocking, MD;  Location: [facility];  Service: General;  Laterality: N/A;  . Hernia repair     dr. Mignon Pine -[facility]  . Appendectomy     reports that he has never smoked. He has never used smokeless tobacco. He reports that he does not drink alcohol or use illicit drugs. family history includes Cancer in his father and mother.  There is no history of Stroke. Allergies  Allergen Reactions  . Carbamazepine     REACTION: fatigue, sleepy  . Ciprofloxacin   . Metronidazole     REACTION: GI upset  . Penicillins     REACTION: childhood, hives?  . Phenytoin     REACTION: rash, leg swelling  . Bactrim Other (See Comments)    Caused headaches, that progressively worsened when on medication. Once off, they went away.  . Phenytoin Sodium Extended Swelling and Rash    Swelling of ankles. Happened years ago per patient.      Review of Systems  Constitutional: Negative for fever, activity change, appetite change, fatigue and unexpected weight change.  HENT: Negative for ear pain, congestion and trouble swallowing.   Eyes: Negative for pain and visual disturbance.  Respiratory: Negative for cough, shortness of breath and wheezing.   Cardiovascular: Negative for  chest pain and palpitations.  Gastrointestinal: Negative for nausea, vomiting, abdominal pain, diarrhea, constipation, blood in stool, abdominal distention and rectal pain.  Genitourinary: Negative for dysuria, hematuria and testicular pain.  Musculoskeletal: Negative for joint swelling and arthralgias.  Skin: Negative for rash.  Neurological: Negative for dizziness, syncope and headaches.  Hematological: Negative for adenopathy.  Psychiatric/Behavioral: Negative for confusion and dysphoric mood.       Objective:   Physical Exam  Constitutional: He is oriented to person, place, and time. He appears well-developed and well-nourished. No distress.  HENT:  Head: Normocephalic and atraumatic.  Right Ear: External ear normal.  Left Ear: External ear normal.  Mouth/Throat: Oropharynx is clear and moist.  Eyes: Conjunctivae and EOM are normal. Pupils are equal, round, and reactive to light.  Neck: Normal range of motion. Neck supple. No thyromegaly present.  Cardiovascular: Normal rate, regular rhythm and normal heart sounds.   No murmur heard. Pulmonary/Chest: No respiratory distress. He has no wheezes. He has no rales.  Abdominal: Soft. Bowel sounds are normal. He exhibits no distension and no mass. There is no tenderness. There is no rebound and no guarding.  Genitourinary: Rectum normal and prostate normal.  Musculoskeletal: He exhibits no edema.       Multiple varicosities lower extremities  Lymphadenopathy:    He has no cervical adenopathy.  Neurological: He is alert and oriented to person, place, and time. He displays normal reflexes. No cranial nerve deficit.  Skin: No rash noted.  Psychiatric: He  has a normal mood and affect. His behavior is normal. Judgment and thought content normal.          Assessment & Plan:  Complete physical. Labs reviewed with patient. He has mild leukopenia and thrombocytopenia of uncertain significance. On phenobarbital for several years and never  had problems with previously. Recheck CBC in one month. Hemoccult cards given. Repeat colonoscopy next year. Continue regular exercise.

## 2012-02-05 ENCOUNTER — Other Ambulatory Visit (INDEPENDENT_AMBULATORY_CARE_PROVIDER_SITE_OTHER): Payer: Medicare Other

## 2012-02-05 DIAGNOSIS — K921 Melena: Secondary | ICD-10-CM

## 2012-02-05 DIAGNOSIS — Z0000 Encounter for general adult medical examination without abnormal findings: Secondary | ICD-10-CM

## 2012-02-05 DIAGNOSIS — Z859 Personal history of malignant neoplasm, unspecified: Secondary | ICD-10-CM

## 2012-02-05 DIAGNOSIS — D72819 Decreased white blood cell count, unspecified: Secondary | ICD-10-CM

## 2012-02-05 LAB — POC HEMOCCULT BLD/STL (HOME/3-CARD/SCREEN)
Card #3 Fecal Occult Blood, POC: NEGATIVE
Fecal Occult Blood, POC: NEGATIVE

## 2012-02-06 NOTE — Progress Notes (Signed)
Quick Note:  Pt informed, will route to [facility] in lab ______

## 2012-02-28 ENCOUNTER — Other Ambulatory Visit (INDEPENDENT_AMBULATORY_CARE_PROVIDER_SITE_OTHER): Payer: Medicare Other

## 2012-02-28 DIAGNOSIS — D72819 Decreased white blood cell count, unspecified: Secondary | ICD-10-CM

## 2012-02-28 LAB — CBC WITH DIFFERENTIAL/PLATELET
Eosinophils Relative: 1.5 % (ref 0.0–5.0)
HCT: 40 % (ref 39.0–52.0)
Hemoglobin: 13.2 g/dL (ref 13.0–17.0)
Lymphs Abs: 0.9 10*3/uL (ref 0.7–4.0)
MCV: 97.6 fl (ref 78.0–100.0)
Monocytes Absolute: 0.4 10*3/uL (ref 0.1–1.0)
Monocytes Relative: 16.2 % — ABNORMAL HIGH (ref 3.0–12.0)
Neutro Abs: 1.2 10*3/uL — ABNORMAL LOW (ref 1.4–7.7)
RDW: 13.8 % (ref 11.5–14.6)

## 2012-03-20 ENCOUNTER — Ambulatory Visit: Payer: Medicare Other | Admitting: Family Medicine

## 2012-03-21 ENCOUNTER — Encounter: Payer: Self-pay | Admitting: Family Medicine

## 2012-03-21 ENCOUNTER — Ambulatory Visit (INDEPENDENT_AMBULATORY_CARE_PROVIDER_SITE_OTHER): Payer: Medicare Other | Admitting: Family Medicine

## 2012-03-21 VITALS — BP 130/70 | Temp 98.0°F | Wt 153.0 lb

## 2012-03-21 DIAGNOSIS — R195 Other fecal abnormalities: Secondary | ICD-10-CM

## 2012-03-21 DIAGNOSIS — R1031 Right lower quadrant pain: Secondary | ICD-10-CM

## 2012-03-21 NOTE — Progress Notes (Signed)
  Subjective:    Patient ID: Nathan [name], male    DOB: Apr 30, 1946, 66 y.o.   MRN: [id number]  HPI  Patient seen with intermittent abdominal pain. He states most of his life he's had some " digestive issues".  He had complicated appendicitis last November with abscess and eventually recovered. He had colonoscopy 2004. He relates intermittent bilateral lower abdominal discomfort. Onset around early to mid July. He has noticed several months now some change in shape of his stools somewhat more pencil shaped and elongated. He's had normal appetite and no weight loss. No fever or chills. No nausea or vomiting. Recent Hemoccults negative. No recent anemia by CBC.  Has taken phenobarbital for several years for seizure disorder. No recent seizures.  Past Medical History  Diagnosis Date  . DYSPNEA 02/09/2009  . RESTLESS LEGS SYNDROME 02/28/2009  . SEIZURE DISORDER 02/09/2009   Past Surgical History  Procedure Date  . Laparoscopic appendectomy 06/01/2011    Procedure: APPENDECTOMY LAPAROSCOPIC;  Surgeon: Kandis Cocking, MD;  Location: [facility];  Service: General;  Laterality: N/A;  . Hernia repair     dr. Mignon Pine -[facility]  . Appendectomy     reports that he has never smoked. He has never used smokeless tobacco. He reports that he does not drink alcohol or use illicit drugs. family history includes Cancer in his father and mother.  There is no history of Stroke. Allergies  Allergen Reactions  . Carbamazepine     REACTION: fatigue, sleepy  . Ciprofloxacin   . Metronidazole     REACTION: GI upset  . Penicillins     REACTION: childhood, hives?  . Phenytoin     REACTION: rash, leg swelling  . Bactrim Other (See Comments)    Caused headaches, that progressively worsened when on medication. Once off, they went away.  . Phenytoin Sodium Extended Swelling and Rash    Swelling of ankles. Happened years ago per patient.      Review of Systems  Constitutional: Negative for fever, chills, appetite  change and unexpected weight change.  Respiratory: Negative for shortness of breath.   Cardiovascular: Negative for chest pain.  Gastrointestinal: Positive for abdominal pain. Negative for nausea, vomiting, diarrhea, blood in stool and anal bleeding.       Objective:   Physical Exam  Constitutional: He appears well-developed and well-nourished.  Cardiovascular: Normal rate and regular rhythm.   Pulmonary/Chest: Breath sounds normal. No respiratory distress. He has no wheezes. He has no rales.  Abdominal: Soft. Bowel sounds are normal. He exhibits no distension and no mass.       Minimally tender right lower quadrant and to lesser extent left lower quadrant. No mass. No guarding or rebound.  Genitourinary:       Recent rectal exam no mass          Assessment & Plan:  Vague intermittent bilateral abdominal discomfort with reported recent change in shape of stools. No recent worrisome anemia or weight loss. Last colonoscopy 2004. Refer back to GI.

## 2012-04-02 ENCOUNTER — Encounter: Payer: Self-pay | Admitting: Internal Medicine

## 2012-04-02 ENCOUNTER — Telehealth: Payer: Self-pay | Admitting: *Deleted

## 2012-04-02 NOTE — Telephone Encounter (Signed)
Spoke with pt and explained that Dr. Rhea Belton wants to see him in the office before his procedure.  Appt made for 05-06-12 at 3:30 p.m. And pt is put on a wait list.  Colonoscopy and PV cancelled

## 2012-04-02 NOTE — Telephone Encounter (Signed)
Per Dr. Rhea Belton, pt is to have an office visit first before procedure.  Attempted to reach all three phone numbers listed- no i.d. On home or cell phone.  Attempted to reach him at work but he is in class.  Will attempt later

## 2012-04-11 ENCOUNTER — Encounter: Payer: Medicare Other | Admitting: Internal Medicine

## 2012-04-30 ENCOUNTER — Ambulatory Visit (INDEPENDENT_AMBULATORY_CARE_PROVIDER_SITE_OTHER): Payer: Medicare Other

## 2012-04-30 DIAGNOSIS — Z23 Encounter for immunization: Secondary | ICD-10-CM

## 2012-05-02 ENCOUNTER — Encounter: Payer: Self-pay | Admitting: Internal Medicine

## 2012-05-06 ENCOUNTER — Ambulatory Visit (INDEPENDENT_AMBULATORY_CARE_PROVIDER_SITE_OTHER): Payer: Medicare Other | Admitting: Internal Medicine

## 2012-05-06 ENCOUNTER — Encounter: Payer: Self-pay | Admitting: Internal Medicine

## 2012-05-06 VITALS — BP 90/60 | HR 64 | Ht 71.25 in | Wt 151.8 lb

## 2012-05-06 DIAGNOSIS — R109 Unspecified abdominal pain: Secondary | ICD-10-CM

## 2012-05-06 DIAGNOSIS — K589 Irritable bowel syndrome without diarrhea: Secondary | ICD-10-CM

## 2012-05-06 DIAGNOSIS — R1030 Lower abdominal pain, unspecified: Secondary | ICD-10-CM

## 2012-05-06 DIAGNOSIS — R198 Other specified symptoms and signs involving the digestive system and abdomen: Secondary | ICD-10-CM

## 2012-05-06 DIAGNOSIS — R194 Change in bowel habit: Secondary | ICD-10-CM

## 2012-05-06 MED ORDER — PEG-KCL-NACL-NASULF-NA ASC-C 100 G PO SOLR: 1.0000 | Freq: Once | ORAL | Status: DC

## 2012-05-06 NOTE — Progress Notes (Signed)
Patient ID: Nathan [name], male   DOB: 26-Jun-1946, 66 y.o.   MRN: [id number]  SUBJECTIVE: HPI Nathan [name] is a 66 year old male with a PMH of appendicitis with intra-abdominal abscess requiring drainage in November 2012, restless leg and seizure disorder who seen in consultation Dr. Caryl Never for evaluation of lower abdominal pain and change in bowel habits. The patient was hospitalized for nearly 3 weeks in November 2012 for ruptured appendicitis requiring surgery, abscess drainage, and prolonged postoperative ileus, but he has recovered nicely from this issue. He reports years of digestive system "spells". He reports he can remember taking medication for "spasm" when he was a young child. He states that he goes through periods of no bowel movement for a few days and on these days he feels well. Then around day 3 or 4 he will have a bowel movement, which is usually soft but formed without blood or melena followed by the feeling poorly. After bowel movement he will have lower abdominal pain which is cramping at times noticeable even in his lower back. He has noticed thinner stools over the last several months. He does have a history of colonoscopy performed approximately 9 years ago by Dr. Benard Rink.  These periods of feeling poorly seem to wax and wane and he can go for weeks to a month and feel well.  He does not relate this to any certain food.  He does at times feel worse when he is very hungry, and at times food makes his abdominal pain better.  He also does not relate any of his symptoms to his appendix surgery about a year ago. He did feel like he had a more prolonged period of feeling well after completing the antibiotics given for his appendicitis and intra-abdominal infection.  He denies diarrhea, and reports occasionally his stools are hard. No fevers or chills. No weight loss. No nausea or vomiting. He does eat a vegetarian diet.  Review of Systems  As per history of present illness, otherwise negative    Past Medical History  Diagnosis Date  . DYSPNEA 02/09/2009  . RESTLESS LEGS SYNDROME 02/28/2009  . SEIZURE DISORDER 02/09/2009    Current Outpatient Prescriptions  Medication Sig Dispense Refill  . cholecalciferol (VITAMIN D) 1000 UNITS tablet Take 1,000 Units by mouth daily.        . Cod Liver Oil OIL Take 5 mLs by mouth daily.        . Coenzyme Q10 (CO Q 10) 60 MG CAPS Take 1 tablet by mouth daily.        . Homeopathic Products (SIMILASAN DRY EYE RELIEF) SOLN Apply 1 drop to eye daily as needed. Dry Eyes       . Magnesium 400 MG CAPS Take 1 capsule by mouth daily.        Marland Kitchen PHENObarbital (LUMINAL) 60 MG tablet Take 1 tablet (60 mg total) by mouth at bedtime.  90 tablet  3  . vitamin C (ASCORBIC ACID) 500 MG tablet Take 500 mg by mouth 3 (three) times daily.        . peg 3350 powder (MOVIPREP) 100 G SOLR Take 1 kit (100 g total) by mouth once.  1 kit  0    Allergies  Allergen Reactions  . Carbamazepine     REACTION: fatigue, sleepy  . Ciprofloxacin   . Metronidazole     REACTION: GI upset  . Penicillins     REACTION: childhood, hives?  . Phenytoin     REACTION: rash, leg  swelling  . Bactrim Other (See Comments)    Caused headaches, that progressively worsened when on medication. Once off, they went away.  . Phenytoin Sodium Extended Swelling and Rash    Swelling of ankles. Happened years ago per patient.    Family History  Problem Relation Age of Onset  . Cancer Mother     ovarian  . Cancer Father     multiple myeloma  . Stroke Neg Hx     grandparent    History  Substance Use Topics  . Smoking status: Never Smoker   . Smokeless tobacco: Never Used  . Alcohol Use: No    OBJECTIVE: BP 90/60  Pulse 64  Ht 5' 11.25" (1.81 m)  Wt 151 lb 12.8 oz (68.856 kg)  BMI 21.02 kg/m2 Constitutional: Well-developed and well-nourished. No distress. HEENT: Normocephalic and atraumatic. Oropharynx is clear and moist. No oropharyngeal exudate. Conjunctivae are normal. No  scleral icterus. Neck: Neck supple. Trachea midline. Cardiovascular: Normal rate, regular rhythm and intact distal pulses. No M/R/G Pulmonary/chest: Effort normal and breath sounds normal. No wheezing, rales or rhonchi. Abdominal: Soft, mild diffuse tenderness without rebound or guarding, nondistended. Bowel sounds active throughout. There are no masses palpable. No hepatosplenomegaly. Extremities: no clubbing, cyanosis, or edema Lymphadenopathy: No cervical adenopathy noted. Neurological: Alert and oriented to person place and time. Skin: Skin is warm and dry. No rashes noted. Psychiatric: Normal mood and affect. Behavior is normal.  Labs and Imaging -- CBC    Component Value Date/Time   WBC 2.6* 02/28/2012 0921   RBC 4.10* 02/28/2012 0921   HGB 13.2 02/28/2012 0921   HCT 40.0 02/28/2012 0921   PLT 154.0 02/28/2012 0921   MCV 97.6 02/28/2012 0921   MCH 31.2 06/15/2011 0455   MCHC 33.0 02/28/2012 0921   RDW 13.8 02/28/2012 0921   LYMPHSABS 0.9 02/28/2012 0921   MONOABS 0.4 02/28/2012 0921   EOSABS 0.0 02/28/2012 0921   BASOSABS 0.0 02/28/2012 0921    CMP     Component Value Date/Time   NA 138 01/24/2012 0854   K 4.1 01/24/2012 0854   CL 98 01/24/2012 0854   CO2 32 01/24/2012 0854   GLUCOSE 93 01/24/2012 0854   BUN 11 01/24/2012 0854   CREATININE 0.7 01/24/2012 0854   CALCIUM 9.0 01/24/2012 0854   PROT 6.2 01/24/2012 0854   ALBUMIN 4.1 01/24/2012 0854   AST 29 01/24/2012 0854   ALT 25 01/24/2012 0854   ALKPHOS 58 01/24/2012 0854   BILITOT 0.7 01/24/2012 0854   GFRNONAA >90 06/14/2011 0555   GFRAA >90 06/14/2011 0555    TSH normal July 2013  ASSESSMENT AND PLAN:  66 year old male with a PMH of appendicitis with intra-abdominal abscess requiring drainage in November 2012, restless leg and seizure disorder who seen in consultation Dr. Caryl Never for evaluation of lower abdominal pain and change in bowel habits.  1.  Change in bowel habits/lower abd pain -- some of the patient's symptoms  seemed irritable in nature and are long-standing. However, given the fact that his pain is seen the worse of late and he has noticed change in his stool caliber, I recommend repeating a colonoscopy. Reportedly his last exam was 9 years ago and normal. We discussed this test and he is agreeable to proceed. Constipation is also in the differential and he may benefit from the use of a laxative.  Also given antibiotics seemed to improve his overall symptoms, small bowel bacterial overgrowth is also possible. IBD or malignancy is felt  less likely. I would like to perform a colonoscopy prior to making any medication decisions regarding constipation or bacterial overgrowth.  We will check a celiac panel today, B12 and folate, and ESR.  I would like for him to try Align one capsule daily as a probiotic to help regulate her digestion. I also will give him a prescription for Levsin to be used as needed and as directed for lower abdominal pain/spasm. Further recommendations to be made after colonoscopy.

## 2012-05-06 NOTE — Patient Instructions (Addendum)
You have been scheduled for a colonoscopy with propofol. Please follow written instructions given to you at your visit today.  Please pick up your prep kit at the pharmacy within the next 1-3 days. If you use inhalers (even only as needed), please bring them with you on the day of your procedure.  We have sent the following medications to your pharmacy for you to pick up at your convenience: moviprep, you were given samples of Align, take one capsul daily

## 2012-05-07 ENCOUNTER — Telehealth: Payer: Self-pay | Admitting: Internal Medicine

## 2012-05-07 ENCOUNTER — Other Ambulatory Visit: Payer: Self-pay | Admitting: Gastroenterology

## 2012-05-07 MED ORDER — HYOSCYAMINE SULFATE 0.125 MG SL SUBL: 0.1250 mg | SUBLINGUAL_TABLET | SUBLINGUAL | Status: DC | PRN

## 2012-05-07 NOTE — Telephone Encounter (Signed)
Levsin sent to pt's pharmacy

## 2012-05-13 ENCOUNTER — Ambulatory Visit (AMBULATORY_SURGERY_CENTER): Payer: Medicare Other | Admitting: Internal Medicine

## 2012-05-13 ENCOUNTER — Encounter: Payer: Self-pay | Admitting: Internal Medicine

## 2012-05-13 ENCOUNTER — Other Ambulatory Visit (INDEPENDENT_AMBULATORY_CARE_PROVIDER_SITE_OTHER): Payer: Medicare Other

## 2012-05-13 ENCOUNTER — Other Ambulatory Visit: Payer: Self-pay | Admitting: *Deleted

## 2012-05-13 VITALS — BP 109/57 | HR 63 | Temp 97.7°F | Resp 13 | Ht 71.25 in | Wt 151.0 lb

## 2012-05-13 DIAGNOSIS — R6881 Early satiety: Secondary | ICD-10-CM

## 2012-05-13 DIAGNOSIS — R109 Unspecified abdominal pain: Secondary | ICD-10-CM

## 2012-05-13 DIAGNOSIS — R194 Change in bowel habit: Secondary | ICD-10-CM

## 2012-05-13 DIAGNOSIS — E46 Unspecified protein-calorie malnutrition: Secondary | ICD-10-CM

## 2012-05-13 DIAGNOSIS — D126 Benign neoplasm of colon, unspecified: Secondary | ICD-10-CM

## 2012-05-13 DIAGNOSIS — R198 Other specified symptoms and signs involving the digestive system and abdomen: Secondary | ICD-10-CM

## 2012-05-13 MED ORDER — SODIUM CHLORIDE 0.9 % IV SOLN: 500.0000 mL | INTRAVENOUS | Status: DC

## 2012-05-13 NOTE — Patient Instructions (Signed)

## 2012-05-13 NOTE — Op Note (Signed)
[facility] [address] [place], [address]   COLONOSCOPY PROCEDURE REPORT  PATIENT: Nathan [name], Nathan [name].  MR#: [id number] BIRTHDATE: 19-Jul-1945 , 66  yrs. old GENDER: Male ENDOSCOPIST: Beverley Fiedler, MD REFERRED BY: PROCEDURE DATE:  05/13/2012 PROCEDURE:   Colonoscopy with snare polypectomy ASA CLASS:   Class II INDICATIONS:change in bowel habits and lower abdominal pain. MEDICATIONS: MAC sedation, administered by CRNA and Propofol (Diprivan) 220 mg IV  DESCRIPTION OF PROCEDURE:   After the risks benefits and alternatives of the procedure were thoroughly explained, informed consent was obtained.  A digital rectal exam revealed no rectal mass.   The LB [id number]  endoscope was introduced through the anus and advanced to the cecum, which was identified by both the appendix and ileocecal valve. No adverse events experienced. The quality of the prep was Moviprep fair  The instrument was then slowly withdrawn as the colon was fully examined.  COLON FINDINGS: Four sessile polyps measuring 3-6 mm in size were found in the transverse colon and descending colon.  Polypectomy was performed using cold snare.  All resections were complete and all polyp tissue was completely retrieved.   Moderate sized internal hemorrhoids were found.   The colon mucosa was otherwise normal.  Retroflexed views revealed internal hemorrhoids. The time to cecum=5 minutes 05 seconds.  Withdrawal time=17 minutes 53 seconds.  The scope was withdrawn and the procedure completed. COMPLICATIONS: There were no complications.  ENDOSCOPIC IMPRESSION: 1.   Four sessile polyps measuring 3-6 mm in size were found in the transverse colon and descending colon; Polypectomy was performed using cold snare 2.   Internal hemorrhoids 3.   The colon mucosa was otherwise normal  RECOMMENDATIONS: 1.  Await pathology results 2.  Continue current medications 3.  If the polyps removed today are proven to be  adenomatous (pre-cancerous) polyps, you will need a colonoscopy in 3 years. Otherwise you should continue to follow colorectal cancer screening guidelines for "routine risk" patients with a colonoscopy in 10 years.  You will receive a letter within 1-2 weeks with the results of your biopsy as well as final recommendations.  Please call my office if you have not received a letter after 3 weeks.   eSigned:  Beverley Fiedler, MD 05/13/2012 4:47 PM  cc: Evelena Peat, MD and The Patient

## 2012-05-13 NOTE — Progress Notes (Signed)
VSS, A&O, report to RN

## 2012-05-13 NOTE — Progress Notes (Signed)
1721 pt. Has passed small amount of gas.  Abdomen soft with no distention.  Denies any discomfort.  Will discharge to home, instructions Given to move around, lie on left side, or sit on commode, or try position of hands and knees.  Pt. And spouse verbalized understandin.  Labs drawn per Dr. Lauro Franklin order.  Patient did not experience any of the following events: a burn prior to discharge; a fall within the facility; wrong site/side/patient/procedure/implant event; or a hospital transfer or hospital admission upon discharge from the facility. [phone]) Patient did not have preoperative order for IV antibiotic SSI prophylaxis. [phone])

## 2012-05-14 ENCOUNTER — Telehealth: Payer: Self-pay | Admitting: *Deleted

## 2012-05-14 ENCOUNTER — Ambulatory Visit (INDEPENDENT_AMBULATORY_CARE_PROVIDER_SITE_OTHER): Payer: Medicare Other | Admitting: Family Medicine

## 2012-05-14 DIAGNOSIS — R569 Unspecified convulsions: Secondary | ICD-10-CM

## 2012-05-14 DIAGNOSIS — E539 Vitamin B deficiency, unspecified: Secondary | ICD-10-CM

## 2012-05-14 MED ORDER — CYANOCOBALAMIN 1000 MCG/ML IJ SOLN
1000.0000 ug | Freq: Once | INTRAMUSCULAR | Status: AC
  Administered 2012-05-14: 1000 ug via INTRAMUSCULAR

## 2012-05-14 NOTE — Progress Notes (Signed)
Subjective:    Patient ID: Nathan [name], male    DOB: [birth year], 66 y.o.   MRN: [id number]  HPI  Patient seen for the following issues  History of seizure disorder. Needing forms completed for Department of [employer]. He had grand mal seizure back in 1994. His last seizure was over 10 years ago back in 2003. Seizures have only occurred during sleep. He takes phenobarbital 60 mg daily and has done extremely well. No other neurologic problems.  Recently had some vague abdominal pains. Saw a gastroenterologist had colonoscopy which revealed some benign appearing polyps. Patient is a vegetarian and had B12 level drawn which was low at 98. No history of anemia or macrocytosis. He is not having any neurologic symptoms. Patient does consume eggs but no other dairy products  Past Medical History  Diagnosis Date  . DYSPNEA 02/09/2009  . RESTLESS LEGS SYNDROME 02/28/2009  . SEIZURE DISORDER 02/09/2009    last seisure Sep 10, 2001   Past Surgical History  Procedure Date  . Laparoscopic appendectomy 06/01/2011    Procedure: APPENDECTOMY LAPAROSCOPIC;  Surgeon: Kandis Cocking, MD;  Location: [facility];  Service: General;  Laterality: N/A;  . Hernia repair     dr. Mignon Pine -[facility]  . Appendectomy     reports that he has never smoked. He has never used smokeless tobacco. He reports that he does not drink alcohol or use illicit drugs. family history includes Cancer in his father and mother.  There is no history of Stroke, and Colon cancer, and Esophageal cancer, and Rectal cancer, and Stomach cancer, . Allergies  Allergen Reactions  . Carbamazepine     REACTION: fatigue, sleepy  . Ciprofloxacin     Per pt, "thinks caused rash"  . Metronidazole     REACTION: GI upset  . Penicillins     REACTION: childhood, hives?  . Phenytoin     REACTION: rash, leg swelling  . Bactrim Other (See Comments)    Caused headaches, that progressively worsened when on medication. Once off, they went away.  .  Phenytoin Sodium Extended Swelling and Rash    Swelling of ankles. Happened years ago per patient.      Review of Systems  Constitutional: Positive for fatigue. Negative for fever and appetite change.  Eyes: Negative for visual disturbance.  Respiratory: Negative for cough and shortness of breath.   Cardiovascular: Negative for chest pain, palpitations and leg swelling.  Neurological: Negative for dizziness, seizures, syncope, weakness and headaches.  Hematological: Negative for adenopathy.  Psychiatric/Behavioral: Negative for confusion.       Objective:   Physical Exam  Constitutional: He is oriented to person, place, and time. He appears well-developed and well-nourished.  Neck: Neck supple. No thyromegaly present.  Cardiovascular: Normal rate and regular rhythm.   Pulmonary/Chest: Effort normal and breath sounds normal. No respiratory distress. He has no wheezes. He has no rales.  Neurological: He is alert and oriented to person, place, and time. No cranial nerve deficit.  Psychiatric: He has a normal mood and affect. His behavior is normal.          Assessment & Plan:  #1 history of seizure disorder. No seizure in over 10 years and these have only occurred during sleep. No history of aura. Forms completed. We do not see any indication for restricting driving at this time  #2 W09 deficiency by recent labs. Go ahead and initiate B12 1000 mcg intramuscular and repeat again in 2 weeks and then monthly and  repeat B12 level about 3-4 months

## 2012-05-14 NOTE — Telephone Encounter (Signed)
lmom for pt to call back for his f/u appt with Dr Rhea Belton.

## 2012-05-14 NOTE — Patient Instructions (Addendum)
Vitamin B12 Deficiency Not having enough vitamin B12 is called a deficiency. Vitamin B12 is an important vitamin. Your body needs vitamin B12 to:   Make red blood cells.  Make DNA. This is the genetic material inside all of your cells.  Help your nerves work properly so they can carry messages from your brain to your body. CAUSES  Not eating enough foods that contain vitamin B12.  Not having enough stomach acid and digestive juices. The body needs these to absorb vitamin B12 from the food you eat.  Having certain digestive system diseases that make it hard to absorb vitamin B12. These diseases include Crohn's disease, chronic pancreatitis, and cystic fibrosis.  Having pernicious anemia, which is a condition where the body has too few red blood cells. People with this condition do not make enough of a protein called "intrinsic factor," which is needed to absorb vitamin B12.  Having a surgery in which part of the stomach or small intestine is removed.  Taking certain medicines that make it hard for the body to absorb vitamin B12. These medicines include:  Heartburn medicine (antacids and proton pump inhibitors).  A certain antibiotic medicine called neomycin, which fights infection.  Some medicines used to treat diabetes, tuberculosis, gout, and high cholesterol. RISK FACTORS Risk factors are things that make you more likely to develop a vitamin B12 deficiency. They include:  Being older than 50.  Being a vegetarian.  Being pregnant and a vegetarian or having a poor diet.  Taking certain drugs.  Being an alcoholic. SYMPTOMS You may have a vitamin B12 deficiency with no symptoms. However, a vitamin B12 deficiency can cause health problems like anemia and nerve damage. These health problems can lead to many possible symptoms, including:  Weakness.  Fatigue.  Loss of appetite.  Weight loss.  Numbness or tingling in your hands and feet.  Redness and burning of  the tongue.  Confusion or memory problems.  Depression.  Dizziness.  Sensory problems, such as loss of taste, color blindness, and ringing in the ears.  Diarrhea or constipation.  Trouble walking. DIAGNOSIS Various types of tests can be given to help find the cause of your vitamin B12 deficiency. These tests include:  A complete blood count (CBC). This test gives your caregiver an overall picture of what makes up your blood.  A blood test to measure your B12 level.  A blood test to measure intrinsic factor.  An endoscopy. This procedure uses a thin tube with a camera on the end to look into your stomach or intestines. TREATMENT Treatment for vitamin B12 deficiency depends on what is causing it. Common options include:  Changing your eating and drinking habits, such as:  Eating more foods that contain vitamin B12.  Not drinking as much alcohol or any alcohol.  Taking vitamin B12 supplements. Your caregiver will tell you what dose is best for you.  Getting vitamin B12 injections. Some people get these a few times a week. Others get them once a month. HOME CARE INSTRUCTIONS  Take all supplements as directed by your caregiver. Follow the directions carefully.  Get any injections your caregiver prescribes. Do not miss your appointments.  Eat lots of healthy foods that contain vitamin B12. Ask your caregiver if you should work with a nutritionist. Good things to include in your diet are:  Meat.  Poultry.  Fish.  Eggs.  Fortified cereal and dairy products. This means vitamin B12 has been added to the food. Check the  label on the package to be sure.  Do not abuse alcohol.  Keep all follow-up appointments. Your caregiver will need to perform blood tests to make sure your vitamin B12 deficiency is going away. SEEK MEDICAL CARE IF:  You have any questions about your treatment.  Your symptoms come back. MAKE SURE YOU:  Understand these instructions.  Will watch  your condition.  Will get help right away if you are not doing well or get worse. Document Released: 09/24/2011 Document Reviewed: 09/24/2011 [facility] Patient Information 2013 [place], [place].  Return in 2 weeks for repeat B12 injection and then will get monthly

## 2012-05-14 NOTE — Telephone Encounter (Signed)
Informed pt of his appt and we changed it so his wife can come with him. Pt was at [facility] and asked if he can have B12 there informed pt to have Dr Caryl Never read Dr Lauro Franklin instructions and I will call him in 3 months to repeat labs. Pt stated understanding.

## 2012-05-14 NOTE — Telephone Encounter (Signed)
Message left

## 2012-05-20 ENCOUNTER — Encounter: Payer: Self-pay | Admitting: Internal Medicine

## 2012-05-26 ENCOUNTER — Ambulatory Visit (INDEPENDENT_AMBULATORY_CARE_PROVIDER_SITE_OTHER): Payer: Medicare Other | Admitting: Family Medicine

## 2012-05-26 DIAGNOSIS — E538 Deficiency of other specified B group vitamins: Secondary | ICD-10-CM

## 2012-05-26 MED ORDER — CYANOCOBALAMIN 1000 MCG/ML IJ SOLN
1000.0000 ug | Freq: Once | INTRAMUSCULAR | Status: AC
  Administered 2012-05-26: 1000 ug via INTRAMUSCULAR

## 2012-05-28 ENCOUNTER — Ambulatory Visit: Payer: Medicare Other | Admitting: Family Medicine

## 2012-06-23 ENCOUNTER — Ambulatory Visit: Payer: Medicare Other | Admitting: Internal Medicine

## 2012-06-25 ENCOUNTER — Ambulatory Visit: Payer: Medicare Other | Admitting: Internal Medicine

## 2012-06-26 ENCOUNTER — Ambulatory Visit (INDEPENDENT_AMBULATORY_CARE_PROVIDER_SITE_OTHER): Payer: Medicare Other | Admitting: Family Medicine

## 2012-06-26 DIAGNOSIS — E539 Vitamin B deficiency, unspecified: Secondary | ICD-10-CM

## 2012-06-26 MED ORDER — CYANOCOBALAMIN 1000 MCG/ML IJ SOLN
1000.0000 ug | Freq: Once | INTRAMUSCULAR | Status: AC
  Administered 2012-06-26: 1000 ug via INTRAMUSCULAR

## 2012-07-01 ENCOUNTER — Encounter: Payer: Self-pay | Admitting: Internal Medicine

## 2012-07-02 ENCOUNTER — Encounter: Payer: Self-pay | Admitting: Internal Medicine

## 2012-07-02 ENCOUNTER — Ambulatory Visit (INDEPENDENT_AMBULATORY_CARE_PROVIDER_SITE_OTHER): Payer: Medicare Other | Admitting: Internal Medicine

## 2012-07-02 VITALS — BP 108/64 | HR 60 | Ht 71.25 in | Wt 154.2 lb

## 2012-07-02 DIAGNOSIS — Z86010 Personal history of colonic polyps: Secondary | ICD-10-CM

## 2012-07-02 DIAGNOSIS — R109 Unspecified abdominal pain: Secondary | ICD-10-CM

## 2012-07-02 DIAGNOSIS — R1030 Lower abdominal pain, unspecified: Secondary | ICD-10-CM

## 2012-07-02 DIAGNOSIS — E538 Deficiency of other specified B group vitamins: Secondary | ICD-10-CM

## 2012-07-02 MED ORDER — DOCUSATE SODIUM 100 MG PO CAPS: 100.0000 mg | ORAL_CAPSULE | Freq: Every day | ORAL | Status: DC

## 2012-07-02 NOTE — Progress Notes (Signed)
  Subjective:    Patient ID: Nathan [name], male    DOB: [birth year], 66 y.o.   MRN: [id number]  HPI Nathan [name] is a 66 year old male with a PMH of appendicitis with intra-abdominal abscess requiring drainage in November 2012, restless leg and seizure disorder who seen in followup. He is accompanied by his wife. He was last seen on 05/06/2012 for evaluation of lower abdominal pain change in bowel habits.  He did undergo colonoscopy on 05/13/2012 which revealed 4 sessile polyps in the transverse and descending colon, pathology revealed sessile serrated adenomas. The colon mucosa was otherwise normal. He had internal hemorrhoids. Since his colonoscopy he has continued on probiotic daily and reports significant improvement with his abdominal pain. He's had no recent abdominal pain and much less pain associated with bowel movement. He does report at time his stools seem a little too firm or "bulky". He's had no rectal bleeding or melena. No fevers or chills. No weight loss. No nausea or vomiting. He was found to be vitamin B 12 deficient and started supplementation with IM injection.  Review of Systems As per history of present illness, otherwise negative  Current Medications, Allergies, Past Medical History, Past Surgical History, Family History and Social History were reviewed in [employer] record.     Objective:   Physical Exam BP 108/64  Pulse 60  Ht 5' 11.25" (1.81 m)  Wt 154 lb 3.2 oz (69.945 kg)  BMI 21.36 kg/m2 Constitutional: Well-developed and well-nourished. No distress. HEENT: Normocephalic and atraumatic. Conjunctivae are normal.  No scleral icterus. Cardiovascular: Normal rate, regular rhythm and intact distal pulses. No M/R/G Pulmonary/chest: Effort normal and breath sounds normal. No wheezing, rales or rhonchi. Abdominal: Soft, nontender, nondistended. Bowel sounds active throughout. There are no masses palpable.  Extremities: no clubbing, cyanosis, or  edema Neurological: Alert and oriented to person place and time. Skin: Skin is warm and dry. No rashes noted. Psychiatric: Normal mood and affect. Behavior is normal.      Assessment & Plan:  66 year old male with a PMH of appendicitis with intra-abdominal abscess requiring drainage in November 2012, restless leg and seizure disorder who seen in followup. He is accompanied by his wife. He was last seen on 05/06/2012 for evaluation of lower abdominal pain change in bowel habits  1.  Lower abd pain -- overall this has improved probiotic therapy. I will add Colace 100 mg in an attempt to soften his stools slightly. We have discussed where he can try MiraLAX 17 g daily if no improvement with docusate.  He is instructed to call our office if abdominal pain returns  2.  Colon polyps -- he did have for adenomatous colon polyps and we discussed my recommendation for repeat in the exam in October 2016. He is aware of this recommendation  3.  B12 def -- he was found to be B12 deficient and has started IM injections with Dr. Caryl Never.  Dr. Caryl Never has plans per his note to recheck B12 to ensure improvement.

## 2012-07-02 NOTE — Patient Instructions (Addendum)
We have sent the following medications to your pharmacy for you to pick up at your convenience: Colace 100 mg daily can increase to twice a daily if needed

## 2012-07-31 ENCOUNTER — Ambulatory Visit (INDEPENDENT_AMBULATORY_CARE_PROVIDER_SITE_OTHER): Payer: Medicare PPO | Admitting: Family Medicine

## 2012-07-31 DIAGNOSIS — E538 Deficiency of other specified B group vitamins: Secondary | ICD-10-CM

## 2012-07-31 MED ORDER — CYANOCOBALAMIN 1000 MCG/ML IJ SOLN
1000.0000 ug | Freq: Once | INTRAMUSCULAR | Status: AC
  Administered 2012-07-31: 1000 ug via INTRAMUSCULAR

## 2012-08-01 LAB — VITAMIN B12: Vitamin B-12: >1500 pg/mL — ABNORMAL HIGH (ref 211–911)

## 2012-08-04 ENCOUNTER — Encounter: Payer: Self-pay | Admitting: Internal Medicine

## 2012-09-09 ENCOUNTER — Telehealth: Payer: Self-pay | Admitting: *Deleted

## 2012-09-09 NOTE — Telephone Encounter (Signed)
[facility] requesting refill of Phenobarbital

## 2012-09-10 MED ORDER — PHENOBARBITAL 60 MG PO TABS: 60.0000 mg | ORAL_TABLET | Freq: Every day | ORAL | Status: DC

## 2012-09-10 NOTE — Telephone Encounter (Signed)
Form filled out, signed and will be faxed back.

## 2012-10-06 ENCOUNTER — Ambulatory Visit (INDEPENDENT_AMBULATORY_CARE_PROVIDER_SITE_OTHER): Payer: Medicare PPO | Admitting: Family Medicine

## 2012-10-06 ENCOUNTER — Encounter: Payer: Self-pay | Admitting: Family Medicine

## 2012-10-06 VITALS — BP 118/60 | Temp 97.5°F | Wt 158.0 lb

## 2012-10-06 DIAGNOSIS — R21 Rash and other nonspecific skin eruption: Secondary | ICD-10-CM

## 2012-10-06 MED ORDER — TRIAMCINOLONE ACETONIDE 0.1 % EX CREA: TOPICAL_CREAM | Freq: Two times a day (BID) | CUTANEOUS | Status: DC

## 2012-10-06 NOTE — Patient Instructions (Addendum)
Call or touch base in 2-3 weeks if no better.

## 2012-10-06 NOTE — Progress Notes (Signed)
  Subjective:    Patient ID: KAPIL PETROPOULOS, male    DOB: Sep 25, 1945, 67 y.o.   MRN: [id number]  HPI Acute visit. Patient seen with pruritic rash left arm Noted about 2 weeks ago Tried tea tree oil without much improvement. No bleeding or pain. No pustules. No other rash reported No exacerbating features   Review of Systems  Constitutional: Negative for chills, appetite change and unexpected weight change.  Skin: Positive for rash.  Hematological: Negative for adenopathy.       Objective:   Physical Exam  Constitutional: He appears well-developed and well-nourished.  Cardiovascular: Normal rate and regular rhythm.   Pulmonary/Chest: Effort normal and breath sounds normal.  Skin: Rash noted.  Left upper arm reveals slightly raised rash with erythematous border but is not completely circular. No pigment. No scaly changes. No pustules. No vesicles. Nontender. Blanches slightly with pressure.          Assessment & Plan:  Skin rash. He does not have any scaly features to suggest tinea corporis. Doubt granuloma annulare. Question eczematous. No evidence for abscess or bacterial process. Triamcinolone 0.1% cream twice daily as needed. Touch base 2-3 weeks if no better

## 2013-01-13 ENCOUNTER — Telehealth: Payer: Self-pay | Admitting: *Deleted

## 2013-01-13 DIAGNOSIS — E538 Deficiency of other specified B group vitamins: Secondary | ICD-10-CM

## 2013-01-13 NOTE — Telephone Encounter (Signed)
Message copied by Florene Glen on Tue Jan 13, 2013  9:03 AM ------      Message from: Florene Glen      Created: Mon Aug 04, 2012  3:46 PM       Check B12 in 6 months after switching pt from injections to oral B12 per 08/04/12 lab results ------

## 2013-01-19 NOTE — Telephone Encounter (Signed)
Spoke with pt about repeating Vit B12 level. He has an appt at [facility] on 01/22/13 and he requested to have the lab drawn there. Sent a note to Dr Caryl Never.

## 2013-01-22 ENCOUNTER — Ambulatory Visit (INDEPENDENT_AMBULATORY_CARE_PROVIDER_SITE_OTHER): Payer: Medicare PPO | Admitting: Family Medicine

## 2013-01-22 ENCOUNTER — Encounter: Payer: Self-pay | Admitting: Family Medicine

## 2013-01-22 VITALS — BP 122/62 | HR 60 | Temp 97.9°F | Ht 71.0 in | Wt 151.0 lb

## 2013-01-22 DIAGNOSIS — E538 Deficiency of other specified B group vitamins: Secondary | ICD-10-CM

## 2013-01-22 DIAGNOSIS — Z136 Encounter for screening for cardiovascular disorders: Secondary | ICD-10-CM

## 2013-01-22 DIAGNOSIS — Z0000 Encounter for general adult medical examination without abnormal findings: Secondary | ICD-10-CM

## 2013-01-22 DIAGNOSIS — Z23 Encounter for immunization: Secondary | ICD-10-CM

## 2013-01-22 DIAGNOSIS — Z125 Encounter for screening for malignant neoplasm of prostate: Secondary | ICD-10-CM

## 2013-01-22 LAB — CBC WITH DIFFERENTIAL/PLATELET
Basophils Absolute: 0 10*3/uL (ref 0.0–0.1)
Basophils Relative: 1.5 % (ref 0.0–3.0)
Eosinophils Absolute: 0.1 10*3/uL (ref 0.0–0.7)
HCT: 40.4 % (ref 39.0–52.0)
Hemoglobin: 13.8 g/dL (ref 13.0–17.0)
Lymphocytes Relative: 39.2 % (ref 12.0–46.0)
Lymphs Abs: 1.1 10*3/uL (ref 0.7–4.0)
MCHC: 34.3 g/dL (ref 30.0–36.0)
MCV: 95.2 fl (ref 78.0–100.0)
Neutro Abs: 1.2 10*3/uL — ABNORMAL LOW (ref 1.4–7.7)
RBC: 4.24 Mil/uL (ref 4.22–5.81)
RDW: 13.4 % (ref 11.5–14.6)

## 2013-01-22 LAB — VITAMIN B12: Vitamin B-12: 1219 pg/mL — ABNORMAL HIGH (ref 211–911)

## 2013-01-22 LAB — BASIC METABOLIC PANEL
BUN: 12 mg/dL (ref 6–23)
Calcium: 9.1 mg/dL (ref 8.4–10.5)
Chloride: 101 mEq/L (ref 96–112)
Creatinine, Ser: 0.6 mg/dL (ref 0.4–1.5)
GFR: 135.45 mL/min (ref >60.00)

## 2013-01-22 LAB — HEPATIC FUNCTION PANEL
ALT: 22 U/L (ref 0–53)
AST: 24 U/L (ref 0–37)
Albumin: 4.2 g/dL (ref 3.5–5.2)
Alkaline Phosphatase: 61 U/L (ref 39–117)
Bilirubin, Direct: 0.1 mg/dL (ref 0.0–0.3)
Total Bilirubin: 0.7 mg/dL (ref 0.3–1.2)
Total Protein: 6.1 g/dL (ref 6.0–8.3)

## 2013-01-22 LAB — LIPID PANEL
Cholesterol: 153 mg/dL (ref 0–200)
HDL: 81.6 mg/dL
LDL Cholesterol: 66 mg/dL (ref 0–99)
Total CHOL/HDL Ratio: 2
Triglycerides: 25 mg/dL (ref 0.0–149.0)
VLDL: 5 mg/dL (ref 0.0–40.0)

## 2013-01-22 LAB — PSA: PSA: 0.52 ng/mL (ref 0.10–4.00)

## 2013-01-22 NOTE — Patient Instructions (Addendum)
Continue with yearly flu vaccine 

## 2013-01-22 NOTE — Progress Notes (Signed)
Subjective:    Patient ID: Nathan [name], male    DOB: 1946-03-15, 67 y.o.   MRN: [id number]  HPI Patient seen for complete physical and medical followup He has history of B12 deficiency, history of adenomatous colon polyps, restless leg syndrome, and remote history of seizure over 12 years ago. He has been on phenobarbital 60 mg daily for several years with no seizure in the past 12 years Colonoscopy last fall. Needs Pneumovax. No specific risk factors for peripheral vascular disease or aneurysm screening other than age Patient has no history of smoking. No family history of peripheral vascular disease.  Past Medical History  Diagnosis Date  . DYSPNEA 02/09/2009  . RESTLESS LEGS SYNDROME 02/28/2009  . SEIZURE DISORDER 02/09/2009    last seisure Sep 10, 2001  . Colon polyps     Sessile Serrated adenomas   Past Surgical History  Procedure Laterality Date  . Laparoscopic appendectomy  06/01/2011    Procedure: APPENDECTOMY LAPAROSCOPIC;  Surgeon: Kandis Cocking, MD;  Location: [facility];  Service: General;  Laterality: N/A;  . Hernia repair      dr. Mignon Pine -[facility]  . Appendectomy      reports that he has never smoked. He has never used smokeless tobacco. He reports that he does not drink alcohol or use illicit drugs. family history includes Cancer in his father and Ovarian cancer in his mother.  There is no history of Stroke, and Colon cancer, and Esophageal cancer, and Rectal cancer, and Stomach cancer, . Allergies  Allergen Reactions  . Carbamazepine     REACTION: fatigue, sleepy  . Ciprofloxacin     Per pt, "thinks caused rash"  . Metronidazole     REACTION: GI upset  . Penicillins     REACTION: childhood, hives?  . Phenytoin     REACTION: rash, leg swelling  . Bactrim Other (See Comments)    Caused headaches, that progressively worsened when on medication. Once off, they went away.  . Phenytoin Sodium Extended Swelling and Rash    Swelling of ankles. Happened years ago per  patient.      Review of Systems  Constitutional: Negative for fever, activity change, appetite change and fatigue.  HENT: Negative for ear pain, congestion and trouble swallowing.   Eyes: Negative for pain and visual disturbance.  Respiratory: Negative for cough, shortness of breath and wheezing.   Cardiovascular: Negative for chest pain and palpitations.  Gastrointestinal: Negative for nausea, vomiting, abdominal pain, diarrhea, constipation, blood in stool, abdominal distention and rectal pain.  Genitourinary: Negative for dysuria, hematuria and testicular pain.  Musculoskeletal: Negative for joint swelling and arthralgias.  Skin: Negative for rash.  Neurological: Negative for dizziness, syncope and headaches.  Hematological: Negative for adenopathy.  Psychiatric/Behavioral: Negative for confusion and dysphoric mood.       Objective:   Physical Exam  Constitutional: He is oriented to person, place, and time. He appears well-developed and well-nourished. No distress.  HENT:  Head: Normocephalic and atraumatic.  Right Ear: External ear normal.  Left Ear: External ear normal.  Mouth/Throat: Oropharynx is clear and moist.  Eyes: Conjunctivae and EOM are normal. Pupils are equal, round, and reactive to light.  Neck: Normal range of motion. Neck supple. No thyromegaly present.  Cardiovascular: Normal rate, regular rhythm and normal heart sounds.   No murmur heard. Pulmonary/Chest: No respiratory distress. He has no wheezes. He has no rales.  Abdominal: Soft. Bowel sounds are normal. He exhibits no distension and no mass. There is  no tenderness. There is no rebound and no guarding.  Genitourinary: Rectum normal and prostate normal.  Musculoskeletal: He exhibits no edema.  Lymphadenopathy:    He has no cervical adenopathy.  Neurological: He is alert and oriented to person, place, and time. He displays normal reflexes. No cranial nerve deficit.  Skin: No rash noted.  Psychiatric: He  has a normal mood and affect.          Assessment & Plan:  Complete physical. Patient needs Pneumovax. Continue yearly flu vaccine. Obtain lab work and include B12 levels to be sent to gastroenterology. Continue regular exercise habits.

## 2013-01-28 ENCOUNTER — Other Ambulatory Visit: Payer: Self-pay | Admitting: Dermatology

## 2013-02-03 ENCOUNTER — Encounter: Payer: Self-pay | Admitting: Family Medicine

## 2013-02-03 ENCOUNTER — Other Ambulatory Visit (INDEPENDENT_AMBULATORY_CARE_PROVIDER_SITE_OTHER): Payer: Medicare PPO

## 2013-02-03 DIAGNOSIS — Z860100 Personal history of colon polyps, unspecified: Secondary | ICD-10-CM

## 2013-02-03 LAB — POC HEMOCCULT BLD/STL (HOME/3-CARD/SCREEN)
Card #2 Fecal Occult Blod, POC: NEGATIVE
Card #3 Date: NEGATIVE

## 2013-04-02 ENCOUNTER — Ambulatory Visit (INDEPENDENT_AMBULATORY_CARE_PROVIDER_SITE_OTHER): Payer: Medicare PPO | Admitting: Surgery

## 2013-04-02 ENCOUNTER — Encounter (INDEPENDENT_AMBULATORY_CARE_PROVIDER_SITE_OTHER): Payer: Self-pay | Admitting: Surgery

## 2013-04-02 VITALS — BP 110/66 | HR 63 | Temp 97.8°F | Resp 14 | Ht 71.0 in | Wt 152.6 lb

## 2013-04-02 DIAGNOSIS — K4090 Unilateral inguinal hernia, without obstruction or gangrene, not specified as recurrent: Secondary | ICD-10-CM

## 2013-04-02 NOTE — Progress Notes (Signed)
Re:   Nathan [name] DOB:   07/01/1946 MRN:   [id number]  ASSESSMENT AND PLAN: 1.  Left inguinal hernia  I discussed the indications and complications of hernia surgery with the patient.  I discussed both the laparoscopic and open approach to hernia repair..  The potential risks of hernia surgery include, but are not limited to, bleeding, infection, open surgery, nerve injury, and recurrence of the hernia.  I provided the patient literature about hernia surgery.  Because he is so thin, I think he would be best served with an open inguinal hernia repair.  Also the hernia is so small, he has the choice of fixing it now or waiting.  But I think he wants to fix it.  He is going to look over Christmas - since he helps his wife in school.  2. Restless leg syndrome  3. History of seizures, etiology unknown   Last seizure 11 years ago. He has not seen a neurologist in some time.   Chief Complaint  Patient presents with  . New Evaluation    eval Lt ing hernia   REFERRING PHYSICIAN: Kristian Covey, MD  HISTORY OF PRESENT ILLNESS: Nathan [name] is a 67 y.o. (DOB: January 16, 1946)  white  male whose primary care physician is Kristian Covey, MD and comes to me today for a left inguinal hernia.  He noticed this about one month ago.  It has only hurt one time.  He said his father had bilateral inguinal hernias, but choose to wear a truss for 20 years.  He had a right inguinal hernia repair by Dr. Luisa Hart 02/11/2009.  He used the Prolene hernia system.  He had very little pain with the right inguinal hernia repair.  He also has an idea of what to expect with the repair.  I took care of him for a lap appendectomy 06/10/2013.  Past Medical History  Diagnosis Date  . DYSPNEA 02/09/2009  . RESTLESS LEGS SYNDROME 02/28/2009  . SEIZURE DISORDER 02/09/2009    last seisure Sep 10, 2001  . Colon polyps     Sessile Serrated adenomas    Past Surgical History  Procedure Laterality Date  . Laparoscopic  appendectomy  06/01/2011    Procedure: APPENDECTOMY LAPAROSCOPIC;  Surgeon: Kandis Cocking, MD;  Location: [facility];  Service: General;  Laterality: N/A;  . Hernia repair Right 02/11/09    dr. Mignon Pine -[facility]  . Appendectomy  06/01/11    Current Outpatient Prescriptions  Medication Sig Dispense Refill  . Calcium-Magnesium-Zinc 167-83-8 MG TABS Take 3 tablets by mouth daily.      . cholecalciferol (VITAMIN D) 1000 UNITS tablet Take 1,000 Units by mouth daily.        . Cod Liver Oil OIL Take 5 mLs by mouth daily.        . Coenzyme Q10 (CO Q 10) 60 MG CAPS Take 1 tablet by mouth daily.        . cyanocobalamin (,VITAMIN B-12,) 1000 MCG/ML injection Inject 1,000 mcg into the muscle once.      . docusate sodium (COLACE) 100 MG capsule Take 1 capsule (100 mg total) by mouth daily.  10 capsule  0  . Homeopathic Products (SIMILASAN DRY EYE RELIEF) SOLN Apply 1 drop to eye daily as needed. Dry Eyes       . hyoscyamine (LEVSIN SL) 0.125 MG SL tablet Place 1 tablet (0.125 mg total) under the tongue every 4 (four) hours as needed for cramping.  30 tablet  0  . PHENObarbital (LUMINAL) 60 MG tablet Take 1 tablet (60 mg total) by mouth at bedtime.  90 tablet  3  . Probiotic Product (ALIGN PO) Take by mouth daily.      Marland Kitchen triamcinolone cream (KENALOG) 0.1 % Apply topically 2 (two) times daily.  15 g  1  . vitamin C (ASCORBIC ACID) 500 MG tablet Take 500 mg by mouth 3 (three) times daily.         No current facility-administered medications for this visit.      Allergies  Allergen Reactions  . Carbamazepine     REACTION: fatigue, sleepy  . Ciprofloxacin     Per pt, "thinks caused rash"  . Metronidazole     REACTION: GI upset  . Penicillins     REACTION: childhood, hives?  . Phenytoin     REACTION: rash, leg swelling  . Bactrim Other (See Comments)    Caused headaches, that progressively worsened when on medication. Once off, they went away.  . Phenytoin Sodium Extended Swelling and Rash    Swelling  of ankles. Happened years ago per patient.    REVIEW OF SYSTEMS: Skin:  No history of rash.  No history of abnormal moles. Infection:  No history of hepatitis or HIV.  No history of MRSA. Neurologic:  History of seizures, etiology unknown.  pon Phenobarb. Cardiac:  No history of hypertension. No history of heart disease.  Saw Dr. Clarene Duke at [facility]. Neg eval. Pulmonary:  Does not smoke cigarettes.  No asthma or bronchitis.  No OSA/CPAP.  Endocrine:  No diabetes. No thyroid disease. Gastrointestinal:  No history of stomach disease.  No history of liver disease.  No history of gall bladder disease.  No history of pancreas disease.  No history of colon disease.  Colonoscopy by Dr. Rhea Belton  In October 2013, removed 3 polyps.  Plan next colonoscopy in 3 years.  He was having some constipation at the time. Urologic:  No history of kidney stones.  No history of bladder infections. Musculoskeletal: Restless leg syndrome. Hematologic:  No bleeding disorder.  No history of anemia.  Not anticoagulated. Psycho-social:  The patient is oriented.   The patient has no obvious psychologic or social impairment to understanding our conversation and plan.  SOCIAL and FAMILY HISTORY: Accompanied with wife. 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 No children.  PHYSICAL EXAM: BP 110/66  Pulse 63  Temp(Src) 97.8 F (36.6 C) (Temporal)  Resp 14  Ht 5\' 11"  (1.803 m)  Wt 152 lb 9.6 oz (69.219 kg)  BMI 21.29 kg/m2  SpO2 98%  General: WN thin WM who is alert and generally healthy appearing.  HEENT: Normal. Pupils equal. Neck: Supple. No mass.  No thyroid mass. Lymph Nodes:  No supraclavicular or cervical nodes. Lungs: Clear to auscultation and symmetric breath sounds.  Pectus excavatum. Heart:  RRR. No murmur or rub.  Abdomen: Soft. No mass. No tenderness. No hernia. Normal bowel sounds.  Can feel mass in right groin from mesh from prior right groin repair.  Small left inguinal hernia seen on  standing. Rectal: Not done. Extremities:  Good strength and ROM  in upper and lower extremities. Neurologic:  Grossly intact to motor and sensory function. Psychiatric: Has normal mood and affect. Behavior is normal.   DATA REVIEWED: Epic notes.  Ovidio Kin, MD,  [facility] [address] [place]  [address]   [place], [place]    [address] Phone:  [phone] FAX:  [phone]

## 2013-04-06 ENCOUNTER — Telehealth: Payer: Self-pay | Admitting: Family Medicine

## 2013-04-06 NOTE — Telephone Encounter (Signed)
Last visit 01/22/13 Last refill 09/10/12 #90 3 refill

## 2013-04-06 NOTE — Telephone Encounter (Signed)
Refill for 6 months. 

## 2013-04-06 NOTE — Telephone Encounter (Signed)
Pt requesting 90 day mail order refill of PHENObarbital (LUMINAL) 60 MG tablet faxed to [facility] at [phone].

## 2013-04-07 MED ORDER — PHENOBARBITAL 60 MG PO TABS: 60.0000 mg | ORAL_TABLET | Freq: Every day | ORAL | Status: DC

## 2013-04-07 NOTE — Telephone Encounter (Signed)
Faxed to [facility].

## 2013-05-07 ENCOUNTER — Ambulatory Visit (INDEPENDENT_AMBULATORY_CARE_PROVIDER_SITE_OTHER): Payer: Medicare PPO | Admitting: Family Medicine

## 2013-05-07 DIAGNOSIS — Z23 Encounter for immunization: Secondary | ICD-10-CM

## 2013-08-05 ENCOUNTER — Ambulatory Visit (INDEPENDENT_AMBULATORY_CARE_PROVIDER_SITE_OTHER): Payer: Medicare Other | Admitting: Family Medicine

## 2013-08-05 ENCOUNTER — Encounter: Payer: Self-pay | Admitting: Family Medicine

## 2013-08-05 VITALS — BP 108/64 | HR 69 | Temp 97.5°F | Wt 153.0 lb

## 2013-08-05 DIAGNOSIS — R569 Unspecified convulsions: Secondary | ICD-10-CM

## 2013-08-05 NOTE — Progress Notes (Signed)
Pre visit review using our clinic review tool, if applicable. No additional management support is needed unless otherwise documented below in the visit note. 

## 2013-08-05 NOTE — Progress Notes (Signed)
Subjective:    Patient ID: Nathan [name], male    DOB: 1945/08/07, 68 y.o.   MRN: [id number]  HPI Patient here for medical form completion. He has history of seizure disorder which goes back to 1994. He had grand mal seizure back in 1994. He's been taking phenobarbital for several years. His last seizure occurrence was almost 12 years ago in February 2003. He is very compliant with therapy. His seizures have always occurred during sleep. He does not have any prior history of any, of cerebral trauma and no history of cerebrovascular disease. He has no impairment of mentation, judgment, memory, or emotional stability. He does not have any issues regarding diminished muscle strength or coordination problems.  General very healthy. He had complications from ruptured appendix back in 2013 but otherwise done extremely well. He takes no regular prescription medications other than the phenobarbital.  Past Medical History  Diagnosis Date  . DYSPNEA 02/09/2009  . RESTLESS LEGS SYNDROME 02/28/2009  . SEIZURE DISORDER 02/09/2009    last seisure Sep 10, 2001  . Colon polyps     Sessile Serrated adenomas   Past Surgical History  Procedure Laterality Date  . Laparoscopic appendectomy  06/01/2011    Procedure: APPENDECTOMY LAPAROSCOPIC;  Surgeon: Shann Medal, MD;  Location: [facility];  Service: General;  Laterality: N/A;  . Hernia repair Right 02/11/09    dr. Donella Stade [facility]  . Appendectomy  06/01/11    reports that he has never smoked. He has never used smokeless tobacco. He reports that he does not drink alcohol or use illicit drugs. family history includes Cancer in his father; Ovarian cancer in his mother. There is no history of Stroke, Colon cancer, Esophageal cancer, Rectal cancer, or Stomach cancer. Allergies  Allergen Reactions  . Carbamazepine     REACTION: fatigue, sleepy  . Ciprofloxacin     Per pt, "thinks caused rash"  . Metronidazole     REACTION: GI upset  . Penicillins     REACTION:  childhood, hives?  . Phenytoin     REACTION: rash, leg swelling  . Bactrim Other (See Comments)    Caused headaches, that progressively worsened when on medication. Once off, they went away.  . Phenytoin Sodium Extended Swelling and Rash    Swelling of ankles. Happened years ago per patient.      Review of Systems  Constitutional: Negative for appetite change and unexpected weight change.  Respiratory: Negative for shortness of breath.   Cardiovascular: Negative for chest pain.  Neurological: Negative for dizziness, tremors, seizures, syncope, weakness and headaches.  Psychiatric/Behavioral: Negative for confusion.       Objective:   Physical Exam  Constitutional: He is oriented to person, place, and time. He appears well-developed and well-nourished.  Eyes: Pupils are equal, round, and reactive to light.  Neck: Neck supple.  Cardiovascular: Normal rate.  Exam reveals no gallop.   No murmur heard. Pulmonary/Chest: Effort normal and breath sounds normal. No respiratory distress. He has no wheezes. He has no rales.  Neurological: He is alert and oriented to person, place, and time. No cranial nerve deficit. He exhibits normal muscle tone. Coordination normal.  Psychiatric: He has a normal mood and affect. His behavior is normal. Judgment and thought content normal.          Assessment & Plan:  History of seizure disorder. He has not had any seizures since 2003. These are grand mal seizures that occur only during sleep. He is very compliant with  therapy. We have not recommended regular yearly evaluations because of infrequent nature of seizures and his high degree of compliance. DMV forms are completed. Will check phenobarbital level with labs for physical this coming year

## 2013-09-21 ENCOUNTER — Telehealth: Payer: Self-pay | Admitting: Family Medicine

## 2013-09-21 MED ORDER — CYANOCOBALAMIN 1000 MCG/ML IJ SOLN: 1000.0000 ug | Freq: Once | INTRAMUSCULAR | Status: DC

## 2013-09-21 NOTE — Telephone Encounter (Signed)
RX sent to pharmacy  

## 2013-09-21 NOTE — Telephone Encounter (Signed)
Pt is needing new rx cyanocobalamin (,VITAMIN B-12,) 1000 MCG/ML injection. [facility]

## 2013-09-22 ENCOUNTER — Telehealth: Payer: Self-pay | Admitting: Family Medicine

## 2013-09-22 NOTE — Telephone Encounter (Signed)
Last visit 08/05/13 Last refill 04/07/13 #90 3 refills

## 2013-09-22 NOTE — Telephone Encounter (Signed)
Pt states he did not order the b12 injection yesterday it is supposed to be  PHENObarbital (LUMINAL) 60 MG tablet .  Can youi send this [facility]?    pt would like to ensure the b12 gets cancelled  FYI Tonya:  [facility] has told them this med will require a prior auth.

## 2013-09-22 NOTE — Telephone Encounter (Signed)
Pt states he did not order the b12 injection yesterday it is supposed to be  °PHENObarbital (LUMINAL) 60 MG tablet .  °Can youi send this to [facility]?   ° °pt would like to ensure the b12 gets cancelled ° FYI Tonya:  [facility] has told them this med will require a prior auth. °

## 2013-09-22 NOTE — Telephone Encounter (Signed)
Refill for one year 

## 2013-09-23 ENCOUNTER — Encounter: Payer: Self-pay | Admitting: Internal Medicine

## 2013-09-23 MED ORDER — PHENOBARBITAL 60 MG PO TABS: 60.0000 mg | ORAL_TABLET | Freq: Every day | ORAL | Status: DC

## 2013-09-23 NOTE — Telephone Encounter (Signed)
Cancelled B12 and Faxed RX to pharmacy.

## 2013-09-24 ENCOUNTER — Encounter: Payer: Self-pay | Admitting: Internal Medicine

## 2013-09-24 ENCOUNTER — Ambulatory Visit (INDEPENDENT_AMBULATORY_CARE_PROVIDER_SITE_OTHER): Payer: Medicare Other | Admitting: Internal Medicine

## 2013-09-24 ENCOUNTER — Other Ambulatory Visit (INDEPENDENT_AMBULATORY_CARE_PROVIDER_SITE_OTHER): Payer: Medicare Other

## 2013-09-24 VITALS — BP 112/60 | HR 72 | Ht 71.0 in | Wt 151.0 lb

## 2013-09-24 DIAGNOSIS — K6289 Other specified diseases of anus and rectum: Secondary | ICD-10-CM

## 2013-09-24 DIAGNOSIS — R109 Unspecified abdominal pain: Secondary | ICD-10-CM

## 2013-09-24 DIAGNOSIS — R110 Nausea: Secondary | ICD-10-CM

## 2013-09-24 DIAGNOSIS — K4090 Unilateral inguinal hernia, without obstruction or gangrene, not specified as recurrent: Secondary | ICD-10-CM

## 2013-09-24 LAB — COMPREHENSIVE METABOLIC PANEL
ALT: 29 U/L (ref 0–53)
AST: 25 U/L (ref 0–37)
Albumin: 4.4 g/dL (ref 3.5–5.2)
Alkaline Phosphatase: 58 U/L (ref 39–117)
BUN: 13 mg/dL (ref 6–23)
CALCIUM: 9.3 mg/dL (ref 8.4–10.5)
CHLORIDE: 100 meq/L (ref 96–112)
CO2: 35 mEq/L — ABNORMAL HIGH (ref 19–32)
Creatinine, Ser: 0.7 mg/dL (ref 0.4–1.5)
GFR: 115.86 mL/min (ref >60.00)
Glucose, Bld: 95 mg/dL (ref 70–99)
POTASSIUM: 4.6 meq/L (ref 3.5–5.1)
Sodium: 139 mEq/L (ref 135–145)
Total Bilirubin: 0.7 mg/dL (ref 0.3–1.2)
Total Protein: 6.4 g/dL (ref 6.0–8.3)

## 2013-09-24 LAB — CBC
HEMATOCRIT: 41.2 % (ref 39.0–52.0)
HEMOGLOBIN: 13.9 g/dL (ref 13.0–17.0)
MCHC: 33.8 g/dL (ref 30.0–36.0)
MCV: 95.6 fl (ref 78.0–100.0)
Platelets: 155 10*3/uL (ref 150.0–400.0)
RBC: 4.31 Mil/uL (ref 4.22–5.81)
RDW: 13.6 % (ref 11.5–14.6)
WBC: 3.4 10*3/uL — ABNORMAL LOW (ref 4.5–10.5)

## 2013-09-24 MED ORDER — HYOSCYAMINE SULFATE 0.125 MG SL SUBL: 0.1250 mg | SUBLINGUAL_TABLET | SUBLINGUAL | Status: DC | PRN

## 2013-09-24 MED ORDER — MOVIPREP 100 G PO SOLR: ORAL | Status: DC

## 2013-09-24 NOTE — Patient Instructions (Signed)
You have been scheduled for a colonoscopy/Endoscopy with propofol. Please follow written instructions given to you at your visit today.  Please pick up your prep kit at the pharmacy within the next 1-3 days. If you use inhalers (even only as needed), please bring them with you on the day of your procedure. Your physician has requested that you go to www.startemmi.com and enter the access code given to you at your visit today. This web site gives a general overview about your procedure. However, you should still follow specific instructions given to you by our office regarding your preparation for the procedure.   We have sent the following medications to your pharmacy for you to pick up at your convenience: [facility] has requested that you go to the basement for the following lab work before leaving today: CBC, CMP

## 2013-09-24 NOTE — Progress Notes (Signed)
Subjective:    Patient ID: Nathan [name], male    DOB: Jun 18, 1946, 68 y.o.   MRN: [id number]  HPI [name] is a 68 year old male with a PMH of appendicitis with intra-abdominal abscess requiring drainage in November 2012, restless leg, seizure disorder, and adenomatous colon polyps who seen in followup. He is here alone today. He reports over last few months she has been dealing with anal pain with defecation along with lower abdominal discomfort. He reports in August he started eating homemade granola for breakfast and then in the fall he noticed a very large caliber stools which were hard to pass. He reports this was quite painful and he felt like he was "birthing a baby". There was no bleeding just pain with passing stool and rectal soreness after defecation. He was having a bowel movement every 2-3 days. One month ago he decided to stop the granola and add fortify prunes daily along with 4 ounces of prune juice. With this he's had smaller caliber stools which are smooth but he is still having pain with passing stools. After BM he notices anterior, though mostly lower abdominal pain. Occasionally also hurts to urinate after bowel movement. His abdominal pain seems to be better if he eats on her regular schedule. He reports he is eating well with a good appetite. Stable weight. No early satiety. No fevers or chills. He has noticed some mild nausea. No vomiting no dysphagia or odynophagia. He is taking it daily probiotic. He was previously given Levsin but never tried it. He does endorse frequent gas and bloating and sometimes the lower gas is "excessive" and socially a problem for him.  Review of Systems As per history of present illness, otherwise negative  Current Medications, Allergies, Past Medical History, Past Surgical History, Family History and Social History were reviewed in [employer] record.      Objective:   Physical Exam BP 112/60  Pulse 72  Ht 5\' 11"   (1.803 m)  Wt 151 lb (68.493 kg)  BMI 21.07 kg/m2 Constitutional: Well-developed and well-nourished. No distress. HEENT: Normocephalic and atraumatic.  No scleral icterus. Neck: Neck supple. Trachea midline. Cardiovascular: Normal rate, regular rhythm and intact distal pulses.  Pulmonary/chest: Effort normal and breath sounds normal. No wheezing, rales or rhonchi. Abdominal: Soft, nontender, nondistended. Bowel sounds active throughout. Extremities: no clubbing, cyanosis, or edema Neurological: Alert and oriented to person place and time. Skin: Skin is warm and dry. No rashes noted. Psychiatric: Normal mood and affect. Behavior is normal.     Assessment & Plan:  68 year old male with a PMH of appendicitis with intra-abdominal abscess requiring drainage in November 2012, restless leg, seizure disorder, and adenomatous colon polyps who seen in followup.  1.  Anal and rectal pain/abd pain -- his symptoms could relate to an anal fissure, though he's had no bleeding. He is having anal and rectal pain with defecation which makes fissure possible. He's also having abdominal pain and rectal pain after bowel movement which sounds like an element of spasm. I have recommended direct visualization we discussed flexible sigmoidoscopy versus colonoscopy. Given his history of polyps, we will proceed to repeat colonoscopy at this time. We discussed the test including the risks and benefits and he is agreeable to proceed. Due to the anterior abdominal pain and nausea, upper endoscopy will be performed on the same day. I will check CBC and CMP today. I have recommended Levsin, as previously prescribed to be used after bowel movement for abdominal discomfort.  Also FODMAP diet to try to help with gas and bloating.  2.  Hx of adenomatous colon polyp -- he is due for repeat colonoscopy on October 2016 but due to #1 we are proceeding with repeat at this time see #1

## 2013-09-28 ENCOUNTER — Ambulatory Visit: Payer: Medicare Other | Admitting: Physician Assistant

## 2013-09-30 ENCOUNTER — Encounter: Payer: Self-pay | Admitting: Internal Medicine

## 2013-10-08 ENCOUNTER — Encounter: Payer: Self-pay | Admitting: Internal Medicine

## 2013-10-27 ENCOUNTER — Encounter: Payer: Medicare Other | Admitting: Internal Medicine

## 2013-11-24 ENCOUNTER — Encounter: Payer: Self-pay | Admitting: Internal Medicine

## 2013-11-24 ENCOUNTER — Ambulatory Visit (AMBULATORY_SURGERY_CENTER): Payer: Medicare Other | Admitting: Internal Medicine

## 2013-11-24 VITALS — BP 124/65 | HR 56 | Temp 96.9°F | Resp 20 | Ht 71.0 in | Wt 151.0 lb

## 2013-11-24 DIAGNOSIS — Z86010 Personal history of colonic polyps: Secondary | ICD-10-CM

## 2013-11-24 DIAGNOSIS — Z1211 Encounter for screening for malignant neoplasm of colon: Secondary | ICD-10-CM

## 2013-11-24 DIAGNOSIS — K6289 Other specified diseases of anus and rectum: Secondary | ICD-10-CM

## 2013-11-24 DIAGNOSIS — D131 Benign neoplasm of stomach: Secondary | ICD-10-CM

## 2013-11-24 DIAGNOSIS — R109 Unspecified abdominal pain: Secondary | ICD-10-CM

## 2013-11-24 DIAGNOSIS — D126 Benign neoplasm of colon, unspecified: Secondary | ICD-10-CM

## 2013-11-24 DIAGNOSIS — Z860101 Personal history of adenomatous and serrated colon polyps: Secondary | ICD-10-CM

## 2013-11-24 MED ORDER — SODIUM CHLORIDE 0.9 % IV SOLN: 500.0000 mL | INTRAVENOUS | Status: DC

## 2013-11-24 NOTE — Progress Notes (Signed)
Called to room to assist during endoscopic procedure.  Patient ID and intended procedure confirmed with present staff. Received instructions for my participation in the procedure from the performing physician.  

## 2013-11-24 NOTE — Op Note (Signed)
[facility]  [address] [place], [address]   COLONOSCOPY PROCEDURE REPORT  PATIENT: Nathan [name], Nathan [name].  MR#: [id number] BIRTHDATE: [birth year] , 67  yrs. old GENDER: Male ENDOSCOPIST: Jerene Bears, MD PROCEDURE DATE:  11/24/2013 PROCEDURE:   Colonoscopy with snare polypectomy First Screening Colonoscopy - Avg.  risk and is 50 yrs.  old or older - No.  Prior Negative Screening - Now for repeat screening. N/A  History of Adenoma - Now for follow-up colonoscopy & has been > or = to 3 yrs.  No.  It has been less than 3 yrs since last colonoscopy.  Medical reason.  Polyps Removed Today? Yes. ASA CLASS:   Class II INDICATIONS:elevated risk screening, Patient's personal history of adenomatous colon polyps, and Rectal pain. MEDICATIONS: MAC sedation, administered by CRNA and propofol (Diprivan) 150mg  IV  DESCRIPTION OF PROCEDURE:   After the risks benefits and alternatives of the procedure were thoroughly explained, informed consent was obtained.  A digital rectal exam revealed internal hemorrhoids.   The LB [id number]  endoscope was introduced through the anus and advanced to the terminal ileum which was intubated for a short distance. No adverse events experienced. The quality of the prep was good, using MoviPrep  The instrument was then slowly withdrawn as the colon was fully examined.   COLON FINDINGS: The mucosa appeared normal in the terminal ileum. Two sessile polyps ranging between 3-3mm in size were found in the ascending colon and descending colon.  Polypectomy was performed using cold snare.  All resections were complete and all polyp tissue was completely retrieved.   The colon mucosa was otherwise normal.  Retroflexed views revealed internal hemorrhoids. The time to cecum=5 minutes 36 seconds.  Withdrawal time=14 minutes 04 seconds.  The scope was withdrawn and the procedure completed. COMPLICATIONS: There were no complications.  ENDOSCOPIC  IMPRESSION: 1.   Normal mucosa in the terminal ileum 2.   Two sessile polyps ranging between 3-77mm in size were found in the ascending colon and descending colon; Polypectomy was performed using cold snare 3.   The colon mucosa was otherwise normal 4.   Moderate sized internal hemorrhoids  RECOMMENDATIONS: 1.  Await pathology results 2.  Repeat Colonoscopy in 5 years. 3.  You will receive a letter within 1-2 weeks with the results of your biopsy as well as final recommendations.  Please call my office if you have not received a letter after 3 weeks.   eSigned:  Jerene Bears, MD 11/24/2013 3:58 PM      cc: The Patient and Carolann Littler, MD

## 2013-11-24 NOTE — Patient Instructions (Signed)
YOU HAD AN ENDOSCOPIC PROCEDURE TODAY AT THE [facility]: Refer to the procedure report that was given to you for any specific questions about what was found during the examination.  If the procedure report does not answer your questions, please call your gastroenterologist to clarify.  If you requested that your care partner not be given the details of your procedure findings, then the procedure report has been included in a sealed envelope for you to review at your convenience later.  YOU SHOULD EXPECT: Some feelings of bloating in the abdomen. Passage of more gas than usual.  Walking can help get rid of the air that was put into your GI tract during the procedure and reduce the bloating. If you had a lower endoscopy (such as a colonoscopy or flexible sigmoidoscopy) you may notice spotting of blood in your stool or on the toilet paper. If you underwent a bowel prep for your procedure, then you may not have a normal bowel movement for a few days.  DIET: Your first meal following the procedure should be a light meal and then it is ok to progress to your normal diet.  A half-sandwich or bowl of soup is an example of a good first meal.  Heavy or fried foods are harder to digest and may make you feel nauseous or bloated.  Likewise meals heavy in dairy and vegetables can cause extra gas to form and this can also increase the bloating.  Drink plenty of fluids but you should avoid alcoholic beverages for 24 hours.  ACTIVITY: Your care partner should take you home directly after the procedure.  You should plan to take it easy, moving slowly for the rest of the day.  You can resume normal activity the day after the procedure however you should NOT DRIVE or use heavy machinery for 24 hours (because of the sedation medicines used during the test).    SYMPTOMS TO REPORT IMMEDIATELY: A gastroenterologist can be reached at any hour.  During normal business hours, 8:30 AM to 5:00 PM Monday through Friday,  call [phone].  After hours and on weekends, please call the GI answering service at [phone] who will take a message and have the physician on call contact you.   Following lower endoscopy (colonoscopy or flexible sigmoidoscopy):  Excessive amounts of blood in the stool  Significant tenderness or worsening of abdominal pains  Swelling of the abdomen that is new, acute  Fever of 100F or higher  Following upper endoscopy (EGD)  Vomiting of blood or coffee ground material  New chest pain or pain under the shoulder blades  Painful or persistently difficult swallowing  New shortness of breath  Fever of 100F or higher  Black, tarry-looking stools  FOLLOW UP: If any biopsies were taken you will be contacted by phone or by letter within the next 1-3 weeks.  Call your gastroenterologist if you have not heard about the biopsies in 3 weeks.  Our staff will call the home number listed on your records the next business day following your procedure to check on you and address any questions or concerns that you may have at that time regarding the information given to you following your procedure. This is a courtesy call and so if there is no answer at the home number and we have not heard from you through the emergency physician on call, we will assume that you have returned to your regular daily activities without incident.  SIGNATURES/CONFIDENTIALITY: You and/or your care   partner have signed paperwork which will be entered into your electronic medical record.  These signatures attest to the fact that that the information above on your After Visit Summary has been reviewed and is understood.  Full responsibility of the confidentiality of this discharge information lies with you and/or your care-partner.  Gastritis, polyps, hemorrhoids-handouts given  Repeat colonoscopy in 5 years.

## 2013-11-24 NOTE — Op Note (Signed)
[place]  [employer]. [place], [id number]   ENDOSCOPY PROCEDURE REPORT  PATIENT: Elridge, Stemm.  MR#: [id number] BIRTHDATE: Jul 17, 1945 , 67  yrs. old GENDER: Male ENDOSCOPIST: Jerene Bears, MD PROCEDURE DATE:  11/24/2013 PROCEDURE:  EGD w/ biopsy ASA CLASS:     Class II INDICATIONS:  abdominal pain. MEDICATIONS: MAC sedation, administered by CRNA and propofol (Diprivan) 200mg  IV TOPICAL ANESTHETIC: Cetacaine Spray  DESCRIPTION OF PROCEDURE: After the risks benefits and alternatives of the procedure were thoroughly explained, informed consent was obtained.  The LB [id number] endoscope was introduced through the mouth and advanced to the second portion of the duodenum. Without limitations.  The instrument was slowly withdrawn as the mucosa was fully examined.   ESOPHAGUS: The mucosa of the esophagus appeared normal.   Z-line regular at 42 cm from the incisors.  STOMACH: Mild gastritis (inflammation) was found in the gastric body and gastric antrum.  Biopsies were taken in the gastric body, antrum and angularis.   Otherwise normal stomach.  DUODENUM: The duodenal mucosa showed no abnormalities in the bulb and second portion of the duodenum. Retroflexed views revealed no abnormalities.     The scope was then withdrawn from the patient and the procedure completed.  COMPLICATIONS: There were no complications.  ENDOSCOPIC IMPRESSION: 1.   The mucosa of the esophagus appeared normal 2.  Mild gastritis (inflammation) was found in the gastric body and gastric antrum; biopsies were taken in the body, antrum and angularis 3.   The duodenal mucosa showed no abnormalities in the bulb and second portion of the duodenum  RECOMMENDATIONS: 1.  Await biopsy results 2.  Proceed with a Colonoscopy.  eSigned:  Jerene Bears, MD 11/24/2013 3:49 PM   [id number] Elease Hashimoto, MD and The Patient

## 2013-11-24 NOTE — Progress Notes (Signed)
Report to pacu RN. Vss, bbs=clear

## 2013-11-25 ENCOUNTER — Telehealth: Payer: Self-pay

## 2013-11-25 NOTE — Telephone Encounter (Signed)
Left message on answering machine. 

## 2013-11-26 ENCOUNTER — Encounter (INDEPENDENT_AMBULATORY_CARE_PROVIDER_SITE_OTHER): Payer: Self-pay | Admitting: Surgery

## 2013-11-26 ENCOUNTER — Ambulatory Visit (INDEPENDENT_AMBULATORY_CARE_PROVIDER_SITE_OTHER): Payer: Medicare Other | Admitting: Surgery

## 2013-11-26 VITALS — BP 126/76 | HR 72 | Temp 98.0°F | Resp 18 | Ht 74.0 in | Wt 149.0 lb

## 2013-11-26 DIAGNOSIS — K4090 Unilateral inguinal hernia, without obstruction or gangrene, not specified as recurrent: Secondary | ICD-10-CM

## 2013-11-26 NOTE — Progress Notes (Signed)
Re:   Nathan [name] DOB:   12-09-1945 MRN:   [id number]  ASSESSMENT AND PLAN: 1.  Left inguinal hernia  I discussed the indications and complications of hernia surgery with the patient.  I discussed both the laparoscopic and open approach to hernia repair..  The potential risks of hernia surgery include, but are not limited to, bleeding, infection, open surgery, nerve injury, and recurrence of the hernia.  I provided the patient literature about hernia surgery at the last visit.  He did not need another book.  Because he is so thin, I think he would be best served with an open inguinal hernia repair.    I gave him his post op prescription for Vicodin.  2. Restless leg syndrome  3. History of seizures, etiology unknown   Last seizure 11 years ago. He has not seen a neurologist in some time. 4.  I did his lap appendectomy - 06/10/2013.  Chief Complaint  Patient presents with  . discuss ing hernia sx   REFERRING PHYSICIAN: [name], MD  HISTORY OF PRESENT ILLNESS: Nathan [name] is a 68 y.o. (DOB: 1945-08-18)  white  male whose primary care physician is [name], MD and comes to me today for a left inguinal hernia. His wife is with him. I saw him last fall, 2014, for this.  He now has decided to have surgery this summer - when they are out of school. He thinks that the hernia may be a little larger.  It is causing almost no pain.  History of hernia: He noticed this about one month ago.  It has only hurt one time.  He said his father had bilateral inguinal hernias, but choose to wear a truss for 20 years.  He had a right inguinal hernia repair by Dr. Brantley Stage 02/11/2009.  He used the Prolene hernia system.  He had very little pain with the right inguinal hernia repair.  He also has an idea of what to expect with the repair.  I took care of him for a lap appendectomy 06/10/2013.  Past Medical History  Diagnosis Date  . DYSPNEA 02/09/2009  . RESTLESS LEGS SYNDROME  02/28/2009  . SEIZURE DISORDER 02/09/2009    last seisure Sep 10, 2001  . Colon polyps     Sessile Serrated adenomas    Past Surgical History  Procedure Laterality Date  . Laparoscopic appendectomy  06/01/2011    Procedure: APPENDECTOMY LAPAROSCOPIC;  Surgeon: Shann Medal, MD;  Location: [facility];  Service: General;  Laterality: N/A;  . Hernia repair Right 02/11/09    dr. Donella Stade [facility]  . Appendectomy  06/01/11    Current Outpatient Prescriptions  Medication Sig Dispense Refill  . Calcium-Magnesium-Zinc 167-83-8 MG TABS Take 3 tablets by mouth daily.      . cholecalciferol (VITAMIN D) 1000 UNITS tablet Take 1,000 Units by mouth daily.        . Cod Liver Oil OIL Take 5 mLs by mouth daily.        . Coenzyme Q10 (CO Q 10) 60 MG CAPS Take 1 tablet by mouth daily.        . Homeopathic Products (SIMILASAN DRY EYE RELIEF) SOLN Apply 1 drop to eye daily as needed. Dry Eyes       . hyoscyamine (LEVSIN SL) 0.125 MG SL tablet Place 1 tablet (0.125 mg total) under the tongue every 4 (four) hours as needed.  30 tablet  0  . PHENObarbital (LUMINAL) 60 MG tablet Take  1 tablet (60 mg total) by mouth at bedtime.  90 tablet  3  . Probiotic Product (PROBIOTIC DAILY PO) Take by mouth daily.      . vitamin B-12 (CYANOCOBALAMIN) 250 MCG tablet Take 250 mcg by mouth daily. Mon-Fri      . vitamin C (ASCORBIC ACID) 500 MG tablet Take 500 mg by mouth 3 (three) times daily.         No current facility-administered medications for this visit.    Allergies  Allergen Reactions  . Carbamazepine     REACTION: fatigue, sleepy  . Ciprofloxacin     Per pt, "thinks caused rash"  . Metronidazole     REACTION: GI upset  . Penicillins     REACTION: childhood, hives?  . Phenytoin     REACTION: rash, leg swelling  . Bactrim Other (See Comments)    Caused headaches, that progressively worsened when on medication. Once off, they went away.  . Phenytoin Sodium Extended Swelling and Rash    Swelling of ankles. Happened  years ago per patient.   REVIEW OF SYSTEMS: Skin:  No history of rash.  No history of abnormal moles. Infection:  No history of hepatitis or HIV.  No history of MRSA. Neurologic:  History of seizures, etiology unknown.  pon Phenobarb. Cardiac:  No history of hypertension. No history of heart disease.  Saw Dr. Rex Kras at [facility]. Neg eval. Pulmonary:  Does not smoke cigarettes.  No asthma or bronchitis.  No OSA/CPAP.  Endocrine:  No diabetes. No thyroid disease. Gastrointestinal:  No history of stomach disease.  No history of liver disease.  No history of gall bladder disease.  No history of pancreas disease.  No history of colon disease.  Colonoscopy by Dr. Hilarie Fredrickson  In October 2013, removed 3 polyps.  He just had another colonoscopy by Dr. Hilarie Fredrickson a couple of days ago. Urologic:  No history of kidney stones.  No history of bladder infections. Musculoskeletal: Restless leg syndrome. Hematologic:  No bleeding disorder.  No history of anemia.  Not anticoagulated. Psycho-social:  The patient is oriented.   The patient has no obvious psychologic or social impairment to understanding our conversation and plan.  SOCIAL and FAMILY HISTORY: Accompanied with wife. 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  PHYSICAL EXAM: BP 126/76  Pulse 72  Temp(Src) 98 F (36.7 C)  Resp 18  Ht 6\' 2"  (1.88 m)  Wt 149 lb (67.586 kg)  BMI 19.12 kg/m2  General: WN thin WM who is alert and generally healthy appearing.  HEENT: Normal. Pupils equal. Neck: Supple. No mass.  No thyroid mass. Lymph Nodes:  No supraclavicular or cervical nodes. Lungs: Clear to auscultation and symmetric breath sounds.  Pectus excavatum. Heart:  RRR. No murmur or rub. Abdomen: Soft. No mass. No tenderness. No hernia. Normal bowel sounds.  Can feel mass in right groin from mesh from prior right groin repair.  Small left inguinal hernia seen on standing.  Reducible.  DATA REVIEWED: Epic notes.  Alphonsa Overall, MD,  [facility], [address]  [place], [place]    [place] Phone:  [phone] FAX:  [phone]

## 2013-12-02 ENCOUNTER — Encounter: Payer: Self-pay | Admitting: Internal Medicine

## 2014-01-05 ENCOUNTER — Other Ambulatory Visit (INDEPENDENT_AMBULATORY_CARE_PROVIDER_SITE_OTHER): Payer: Medicare Other

## 2014-01-05 DIAGNOSIS — Z0000 Encounter for general adult medical examination without abnormal findings: Secondary | ICD-10-CM

## 2014-01-05 DIAGNOSIS — N400 Enlarged prostate without lower urinary tract symptoms: Secondary | ICD-10-CM

## 2014-01-05 DIAGNOSIS — E538 Deficiency of other specified B group vitamins: Secondary | ICD-10-CM

## 2014-01-05 DIAGNOSIS — Z136 Encounter for screening for cardiovascular disorders: Secondary | ICD-10-CM

## 2014-01-05 LAB — POCT URINALYSIS DIPSTICK
Bilirubin, UA: NEGATIVE
Blood, UA: NEGATIVE
Glucose, UA: NEGATIVE
KETONES UA: NEGATIVE
Leukocytes, UA: NEGATIVE
Nitrite, UA: NEGATIVE
PROTEIN UA: NEGATIVE
SPEC GRAV UA: 1.015
Urobilinogen, UA: 0.2
pH, UA: 7

## 2014-01-05 LAB — LIPID PANEL
CHOLESTEROL: 164 mg/dL (ref 0–200)
HDL: 85.5 mg/dL (ref >39.00)
LDL CALC: 74 mg/dL (ref 0–99)
NonHDL: 78.5
Total CHOL/HDL Ratio: 2
Triglycerides: 24 mg/dL (ref 0.0–149.0)
VLDL: 4.8 mg/dL (ref 0.0–40.0)

## 2014-01-05 LAB — BASIC METABOLIC PANEL
BUN: 12 mg/dL (ref 6–23)
CO2: 32 mEq/L (ref 19–32)
Calcium: 9.1 mg/dL (ref 8.4–10.5)
Chloride: 100 mEq/L (ref 96–112)
Creatinine, Ser: 0.7 mg/dL (ref 0.4–1.5)
GFR: 121.59 mL/min (ref >60.00)
Glucose, Bld: 101 mg/dL — ABNORMAL HIGH (ref 70–99)
POTASSIUM: 4.1 meq/L (ref 3.5–5.1)
SODIUM: 139 meq/L (ref 135–145)

## 2014-01-05 LAB — HEPATIC FUNCTION PANEL
ALBUMIN: 4.5 g/dL (ref 3.5–5.2)
ALK PHOS: 51 U/L (ref 39–117)
ALT: 24 U/L (ref 0–53)
AST: 24 U/L (ref 0–37)
Bilirubin, Direct: 0.2 mg/dL (ref 0.0–0.3)
Total Bilirubin: 0.7 mg/dL (ref 0.2–1.2)
Total Protein: 6.3 g/dL (ref 6.0–8.3)

## 2014-01-05 LAB — CBC WITH DIFFERENTIAL/PLATELET
BASOS PCT: 1 % (ref 0.0–3.0)
Basophils Absolute: 0 10*3/uL (ref 0.0–0.1)
Eosinophils Absolute: 0.1 10*3/uL (ref 0.0–0.7)
Eosinophils Relative: 2.1 % (ref 0.0–5.0)
HCT: 41.8 % (ref 39.0–52.0)
Hemoglobin: 14.1 g/dL (ref 13.0–17.0)
Lymphocytes Relative: 42.9 % (ref 12.0–46.0)
Lymphs Abs: 1.3 10*3/uL (ref 0.7–4.0)
MCHC: 33.8 g/dL (ref 30.0–36.0)
MCV: 96.2 fl (ref 78.0–100.0)
MONOS PCT: 15.5 % — AB (ref 3.0–12.0)
Monocytes Absolute: 0.5 10*3/uL (ref 0.1–1.0)
NEUTROS ABS: 1.2 10*3/uL — AB (ref 1.4–7.7)
Neutrophils Relative %: 38.5 % — ABNORMAL LOW (ref 43.0–77.0)
Platelets: 155 10*3/uL (ref 150.0–400.0)
RBC: 4.34 Mil/uL (ref 4.22–5.81)
RDW: 13.2 % (ref 11.5–15.5)
WBC: 3.1 10*3/uL — AB (ref 4.0–10.5)

## 2014-01-05 LAB — TSH: TSH: 2.36 u[IU]/mL (ref 0.35–4.50)

## 2014-01-05 LAB — PSA: PSA: 0.81 ng/mL (ref 0.10–4.00)

## 2014-01-11 ENCOUNTER — Ambulatory Visit (INDEPENDENT_AMBULATORY_CARE_PROVIDER_SITE_OTHER): Payer: Medicare Other | Admitting: Family Medicine

## 2014-01-11 ENCOUNTER — Encounter: Payer: Self-pay | Admitting: Family Medicine

## 2014-01-11 VITALS — BP 120/70 | HR 60 | Temp 97.9°F | Ht 71.0 in | Wt 150.0 lb

## 2014-01-11 DIAGNOSIS — Z0000 Encounter for general adult medical examination without abnormal findings: Secondary | ICD-10-CM

## 2014-01-11 DIAGNOSIS — R569 Unspecified convulsions: Secondary | ICD-10-CM

## 2014-01-11 DIAGNOSIS — E538 Deficiency of other specified B group vitamins: Secondary | ICD-10-CM

## 2014-01-11 LAB — VITAMIN B12: Vitamin B-12: 575 pg/mL (ref 211–911)

## 2014-01-11 NOTE — Progress Notes (Signed)
Pre visit review using our clinic review tool, if applicable. No additional management support is needed unless otherwise documented below in the visit note. 

## 2014-01-11 NOTE — Patient Instructions (Signed)
Continue yearly flu vaccine Continue regular weightbearing exercise. We will call you regarding B12 levels. Recommend continued daily compression garments for varicose veins. Consider Prevnar 13 vaccine by next year

## 2014-01-11 NOTE — Progress Notes (Signed)
Subjective:    Patient ID: Nathan [name], male    DOB: Jul 06, 1946, 68 y.o.   MRN: [id number]  HPI Patient seen for Medicare wellness exam and medical followup.  History of B12 deficiency and he takes sublingual replacement. He had recent bilateral numbness in both legs but no weakness. He had treatment for varicose veins and was told apparently some injectables could cause potentially some numbness. No back pain. No loss of urine or bowel control. Remote history of seizures but none in many years now. He takes seizure prevention. Has some restless leg syndrome in the past. Exercises 2-3 days per week. Never smoked.  Recent colonoscopy revealed a couple of benign polyps. Immunizations are up-to-date  Past Medical History  Diagnosis Date  . DYSPNEA 02/09/2009  . RESTLESS LEGS SYNDROME 02/28/2009  . SEIZURE DISORDER 02/09/2009    last seisure Sep 10, 2001  . Colon polyps     Sessile Serrated adenomas   Past Surgical History  Procedure Laterality Date  . Laparoscopic appendectomy  06/01/2011    Procedure: APPENDECTOMY LAPAROSCOPIC;  Surgeon: Shann Medal, MD;  Location: [facility];  Service: General;  Laterality: N/A;  . Hernia repair Right 02/11/09    dr. Donella Stade -[facility]  . Appendectomy  06/01/11    reports that he has never smoked. He has never used smokeless tobacco. He reports that he does not drink alcohol or use illicit drugs. family history includes Cancer in his father; Ovarian cancer in his mother. There is no history of Stroke, Colon cancer, Esophageal cancer, Rectal cancer, or Stomach cancer. Allergies  Allergen Reactions  . Carbamazepine     REACTION: fatigue, sleepy  . Ciprofloxacin     Per pt, "thinks caused rash"  . Metronidazole     REACTION: GI upset  . Penicillins     REACTION: childhood, hives?  . Phenytoin     REACTION: rash, leg swelling  . Bactrim Other (See Comments)    Caused headaches, that progressively worsened when on medication. Once off, they went away.    . Phenytoin Sodium Extended Swelling and Rash    Swelling of ankles. Happened years ago per patient.   1.  Risk factors based on Past Medical , Social, and Family history reviewed and as above with no changes 2.  Limitations in physical activities very active. Low risk for fall 3.  Depression/mood no depression or anxiety issues 4.  Hearing subjectively diminished. Audiometry screen reveals diminished hearing at 1000 dB for right and left ear but not at other frequencies 5.  ADLs independent in all 6.  Cognitive function (orientation to time and place, language, writing, speech,memory) memory intact. Judgment intact 7.  Home Safety no issues 8.  Height, weight, and visual acuity. All stable 9.  Counseling recommend continue regular weightbearing exercise. 10. Recommendation of preventive services. Prevnar 13 by next year. Continue yearly flu vaccine. Colonoscopy was just done. 11. Labs based on risk factors recent labs were all reviewed with no major abnormalities 12. Care Plan as above. We discussed continued yearly flu vaccine. Establish more consistent exercise.    Review of Systems  Constitutional: Negative for fever, activity change, appetite change and fatigue.  HENT: Negative for congestion, ear pain and trouble swallowing.   Eyes: Negative for pain and visual disturbance.  Respiratory: Negative for cough, shortness of breath and wheezing.   Cardiovascular: Negative for chest pain and palpitations.  Gastrointestinal: Negative for nausea, vomiting, abdominal pain, diarrhea, constipation, blood in stool, abdominal distention and  rectal pain.  Genitourinary: Negative for dysuria, hematuria and testicular pain.  Musculoskeletal: Negative for arthralgias and joint swelling.  Skin: Negative for rash.  Neurological: Negative for dizziness, syncope and headaches.  Hematological: Negative for adenopathy.  Psychiatric/Behavioral: Negative for confusion and dysphoric mood.        Objective:   Physical Exam  Constitutional: He is oriented to person, place, and time. He appears well-developed and well-nourished. No distress.  HENT:  Head: Normocephalic and atraumatic.  Right Ear: External ear normal.  Left Ear: External ear normal.  Mouth/Throat: Oropharynx is clear and moist.  Eyes: Conjunctivae and EOM are normal. Pupils are equal, round, and reactive to light.  Neck: Normal range of motion. Neck supple. No thyromegaly present.  Cardiovascular: Normal rate, regular rhythm and normal heart sounds.   No murmur heard. Pulmonary/Chest: No respiratory distress. He has no wheezes. He has no rales.  Abdominal: Soft. Bowel sounds are normal. He exhibits no distension and no mass. There is no tenderness. There is no rebound and no guarding.  Genitourinary: Rectum normal and prostate normal.  Musculoskeletal: He exhibits no edema.  Lymphadenopathy:    He has no cervical adenopathy.  Neurological: He is alert and oriented to person, place, and time. He displays normal reflexes. No cranial nerve deficit.  Skin: No rash noted.  Psychiatric: He has a normal mood and affect.          Assessment & Plan:  #1 health maintenance. Immunizations up-to-date. Consider Prevnar 13 by next year. Continue yearly flu vaccine. Recent colonoscopy as above #2 history of seizure disorder. Stable. Continue phenobarbital #3 bilateral leg numbness. Nonfocal exam. Repeat B12 level with history of prior deficiency.  Recent TSH normal.  No hx of diabetes.

## 2014-01-12 ENCOUNTER — Encounter: Payer: Self-pay | Admitting: Family Medicine

## 2014-01-13 ENCOUNTER — Encounter: Payer: Self-pay | Admitting: Family Medicine

## 2014-01-22 ENCOUNTER — Other Ambulatory Visit (INDEPENDENT_AMBULATORY_CARE_PROVIDER_SITE_OTHER): Payer: Self-pay

## 2014-01-22 DIAGNOSIS — K4090 Unilateral inguinal hernia, without obstruction or gangrene, not specified as recurrent: Secondary | ICD-10-CM

## 2014-02-10 ENCOUNTER — Ambulatory Visit (INDEPENDENT_AMBULATORY_CARE_PROVIDER_SITE_OTHER): Payer: Medicare Other | Admitting: Surgery

## 2014-02-10 ENCOUNTER — Encounter (INDEPENDENT_AMBULATORY_CARE_PROVIDER_SITE_OTHER): Payer: Self-pay | Admitting: Surgery

## 2014-02-10 VITALS — BP 124/74 | HR 72 | Temp 97.4°F | Ht 71.0 in | Wt 146.0 lb

## 2014-02-10 DIAGNOSIS — K4090 Unilateral inguinal hernia, without obstruction or gangrene, not specified as recurrent: Secondary | ICD-10-CM

## 2014-02-10 NOTE — Progress Notes (Signed)
Re:   Nathan [name] DOB:   Feb 25, 1946 MRN:   [id number]  ASSESSMENT AND PLAN: 1.  Left inguinal hernia repair - [facility] - 01/27/2014  Doing well.  Return is PRN.  2. Restless leg syndrome  3. History of seizures, etiology unknown   Last seizure 11 years ago. He has not seen a neurologist in some time. 4.  I did his lap appendectomy - 06/10/2013.  Chief Complaint  Patient presents with  . Follow-up   REFERRING PHYSICIAN: [name], MD  HISTORY OF PRESENT ILLNESS: Nathan [name] is a 68 y.o. (DOB: 1946/02/06)  white  male whose primary care physician is [name], MD and comes to me today for a left inguinal hernia. His wife is with him. He has felt twinges of pain.  He is doing well.  We talked about school a little.  History of hernia: He noticed this about one month ago.  It has only hurt one time.  He said his father had bilateral inguinal hernias, but choose to wear a truss for 20 years.  He had a right inguinal hernia repair by Dr. Brantley Stage 02/11/2009.  He used the Prolene hernia system.  He had very little pain with the right inguinal hernia repair.  He also has an idea of what to expect with the repair.  I took care of him for a lap appendectomy 06/10/2013.  I saw him last fall, 2014, for this.  He now has decided to have surgery this summer - when they are out of school. He thinks that the hernia may be a little larger.  It is causing almost no pain.  Past Medical History  Diagnosis Date  . DYSPNEA 02/09/2009  . RESTLESS LEGS SYNDROME 02/28/2009  . SEIZURE DISORDER 02/09/2009    last seisure Sep 10, 2001  . Colon polyps     Sessile Serrated adenomas    Past Surgical History  Procedure Laterality Date  . Laparoscopic appendectomy  06/01/2011    Procedure: APPENDECTOMY LAPAROSCOPIC;  Surgeon: Shann Medal, MD;  Location: [facility];  Service: General;  Laterality: N/A;  . Hernia repair Right 02/11/09    dr. Donella Stade -[facility]  . Appendectomy  06/01/11    Current  Outpatient Prescriptions  Medication Sig Dispense Refill  . Calcium-Magnesium-Zinc 167-83-8 MG TABS Take 3 tablets by mouth daily.      . cholecalciferol (VITAMIN D) 1000 UNITS tablet Take 1,000 Units by mouth daily.        . Cod Liver Oil OIL Take 5 mLs by mouth daily.        . Coenzyme Q10 (CO Q 10) 60 MG CAPS Take 1 tablet by mouth daily.        . Homeopathic Products (SIMILASAN DRY EYE RELIEF) SOLN Apply 1 drop to eye daily as needed. Dry Eyes       . hyoscyamine (LEVSIN SL) 0.125 MG SL tablet Place 1 tablet (0.125 mg total) under the tongue every 4 (four) hours as needed.  30 tablet  0  . PHENObarbital (LUMINAL) 60 MG tablet Take 1 tablet (60 mg total) by mouth at bedtime.  90 tablet  3  . Probiotic Product (PROBIOTIC DAILY PO) Take by mouth daily.      . vitamin B-12 (CYANOCOBALAMIN) 250 MCG tablet Take 250 mcg by mouth daily. Mon-Fri      . vitamin C (ASCORBIC ACID) 500 MG tablet Take 500 mg by mouth 3 (three) times daily.  No current facility-administered medications for this visit.    Allergies  Allergen Reactions  . Carbamazepine     REACTION: fatigue, sleepy  . Ciprofloxacin     Per pt, "thinks caused rash"  . Metronidazole     REACTION: GI upset  . Penicillins     REACTION: childhood, hives?  . Phenytoin     REACTION: rash, leg swelling  . Bactrim Other (See Comments)    Caused headaches, that progressively worsened when on medication. Once off, they went away.  . Phenytoin Sodium Extended Swelling and Rash    Swelling of ankles. Happened years ago per patient.   REVIEW OF SYSTEMS: Neurologic:  History of seizures, etiology unknown.  pon Phenobarb. Cardiac:  No history of hypertension. No history of heart disease.  Saw Dr. Rex Kras at [facility]. Neg eval. Gastrointestinal:  No history of stomach disease.  No history of liver disease.  No history of gall bladder disease.  No history of pancreas disease.  No history of colon disease.  Colonoscopy by Dr. Hilarie Fredrickson  In October  2013, removed 3 polyps.  He just had another colonoscopy by Dr. Hilarie Fredrickson a couple of days ago. Urologic:  No history of kidney stones.  No history of bladder infections. Musculoskeletal: Restless leg syndrome.  SOCIAL and FAMILY HISTORY: Accompanied with wife. 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  PHYSICAL EXAM: BP 124/74  Pulse 72  Temp(Src) 97.4 F (36.3 C)  Ht 5\' 11"  (1.803 m)  Wt 146 lb (66.225 kg)  BMI 20.37 kg/m2  General: WN thin WM who is alert and generally healthy appearing.  HEENT: Normal. Pupils equal. Abdomen: Soft. Some fullness at incision, but the wound looks good.    DATA REVIEWED: Epic notes.  Alphonsa Overall, MD,  [facility], [place] [address]  [place], [place]    [place] Phone:  [phone] FAX:  [phone]

## 2014-03-23 ENCOUNTER — Telehealth: Payer: Self-pay

## 2014-03-23 ENCOUNTER — Ambulatory Visit (INDEPENDENT_AMBULATORY_CARE_PROVIDER_SITE_OTHER): Payer: Medicare Other | Admitting: Diagnostic Neuroimaging

## 2014-03-23 ENCOUNTER — Encounter: Payer: Self-pay | Admitting: Diagnostic Neuroimaging

## 2014-03-23 VITALS — BP 111/69 | HR 69 | Ht 71.0 in | Wt 147.2 lb

## 2014-03-23 DIAGNOSIS — R200 Anesthesia of skin: Secondary | ICD-10-CM

## 2014-03-23 DIAGNOSIS — R209 Unspecified disturbances of skin sensation: Secondary | ICD-10-CM

## 2014-03-23 NOTE — Telephone Encounter (Signed)
Phenobarbital  Last visit 01/11/14 Last refill 09/23/13 #90 3 refill  [facility]

## 2014-03-23 NOTE — Patient Instructions (Signed)
I will check EMG/NCS (electrical nerve test).

## 2014-03-23 NOTE — Progress Notes (Signed)
[facility]  PATIENT: Nathan [name] DOB: 08/18/1945  REFERRING CLINICIAN: [place] HISTORY FROM: patient  REASON FOR VISIT: new consult    HISTORICAL  CHIEF COMPLAINT:  Chief Complaint  Patient presents with  . Numbness    legs    HISTORY OF PRESENT ILLNESS:   68 year old right-handed male here for evaluation of numbness in feet and legs.  For past 1-1/2 years patient has noted some numbness in his feet. This started 6 months after varicose vein injection therapy. April 2015 this significantly and suddenly increased to numbness in bilateral feet, lower legs, up to the knees. The right leg is more affected than the left. He feels a numb asleep sensation in his feet. No pain. No balance difficulty. No weakness. No symptoms in his arms or hands. No low back pain or neck pain.  Patient also has history of seizure disorder which started at age 38 years old. He has had 3 grand mal seizures in his life, all occurring at nighttime during sleep. First seizure was in 06/19/1993. He was started on Dilantin but this caused ankle swelling. He was switched to Tegretol but this caused him to be sleepy. He was transitioned to phenobarbital 30 mg this controlled his seizures. He had a second seizure in 1997 but then was seizure free until February 2003. Patient stopped phenobarbital for 3-4 months prior, due to long period of seizure freedom. After his last breakthrough seizure he has been on phenobarbital. This was increased to 60 mg at night at some point to help with insomnia. Patient is stable on this dose.   REVIEW OF SYSTEMS: Full 14 system review of systems performed and notable only for numbness restless legs seizure disorder.  ALLERGIES: Allergies  Allergen Reactions  . Carbamazepine     REACTION: fatigue, sleepy  . Ciprofloxacin     Per pt, "thinks caused rash"  . Metronidazole     REACTION: GI upset  . Penicillins     REACTION: childhood, hives?  .  Phenytoin     REACTION: rash, leg swelling  . Bactrim Other (See Comments)    Caused headaches, that progressively worsened when on medication. Once off, they went away.  . Phenytoin Sodium Extended Swelling and Rash    Swelling of ankles. Happened years ago per patient.    HOME MEDICATIONS: Outpatient Prescriptions Prior to Visit  Medication Sig Dispense Refill  . Calcium-Magnesium-Zinc 167-83-8 MG TABS Take 3 tablets by mouth daily.      . cholecalciferol (VITAMIN D) 1000 UNITS tablet Take 1,000 Units by mouth daily.        . Cod Liver Oil OIL Take 5 mLs by mouth daily.        . Coenzyme Q10 (CO Q 10) 60 MG CAPS Take 1 tablet by mouth daily.        . Homeopathic Products (SIMILASAN DRY EYE RELIEF) SOLN Apply 1 drop to eye daily as needed. Dry Eyes       . hyoscyamine (LEVSIN SL) 0.125 MG SL tablet Place 1 tablet (0.125 mg total) under the tongue every 4 (four) hours as needed.  30 tablet  0  . PHENObarbital (LUMINAL) 60 MG tablet Take 1 tablet (60 mg total) by mouth at bedtime.  90 tablet  3  . Probiotic Product (PROBIOTIC DAILY PO) Take by mouth daily.      . vitamin C (ASCORBIC ACID) 500 MG tablet Take 500 mg by mouth 3 (three) times daily.        Marland Kitchen  vitamin B-12 (CYANOCOBALAMIN) 250 MCG tablet Take 250 mcg by mouth daily. Mon-Fri       No facility-administered medications prior to visit.    PAST MEDICAL HISTORY: Past Medical History  Diagnosis Date  . DYSPNEA 02/09/2009  . RESTLESS LEGS SYNDROME 02/28/2009  . SEIZURE DISORDER 02/09/2009    last seisure Sep 10, 2001  . Colon polyps     Sessile Serrated adenomas    PAST SURGICAL HISTORY: Past Surgical History  Procedure Laterality Date  . Laparoscopic appendectomy  06/01/2011    Procedure: APPENDECTOMY LAPAROSCOPIC;  Surgeon: Shann Medal, MD;  Location: [facility];  Service: General;  Laterality: N/A;  . Hernia repair Right 02/11/09    dr. Donella Stade -[facility]  . Appendectomy  06/01/11    FAMILY HISTORY: Family History  Problem  Relation Age of Onset  . Ovarian cancer Mother   . Cancer Father     multiple myeloma  . Stroke Neg Hx     grandparent  . Colon cancer Neg Hx   . Esophageal cancer Neg Hx   . Rectal cancer Neg Hx   . Stomach cancer Neg Hx     SOCIAL HISTORY:  History   Social History  . Marital Status: Married    Spouse Name: Thayer Headings    Number of Children: 0  . Years of Education: 2 MA's    Occupational History  . Retired Pharmacist, hospital   .  Other    Part Time: [employer]   Social History Main Topics  . Smoking status: Never Smoker   . Smokeless tobacco: Never Used  . Alcohol Use: No  . Drug Use: No  . Sexual Activity: Not on file   Other Topics Concern  . Not on file   Social History Narrative   Patient lives at home with spouse.   Caffeine Use: quit 1980     PHYSICAL EXAM  Filed Vitals:   03/23/14 0920  BP: 111/69  Pulse: 69  Height: $Remove'5\' 11"'sVnsqvS$  (1.803 m)  Weight: 147 lb 3.2 oz (66.769 kg)    Not recorded    Body mass index is 20.54 kg/(m^2).  GENERAL EXAM: Patient is in no distress; well developed, nourished and groomed; neck is supple  CARDIOVASCULAR: Regular rate and rhythm, no murmurs, no carotid bruits  NEUROLOGIC: MENTAL STATUS: awake, alert, oriented to person, place and time, recent and remote memory intact, normal attention and concentration, language fluent, comprehension intact, naming intact, fund of knowledge appropriate CRANIAL NERVE: no papilledema on fundoscopic exam, pupils equal and reactive to light, visual fields full to confrontation, extraocular muscles intact, no nystagmus, facial sensation and strength symmetric, hearing intact, palate elevates symmetrically, uvula midline, shoulder shrug symmetric, tongue midline. MOTOR: normal bulk and tone, full strength in the BUE, BLE SENSORY: normal and symmetric to light touch and proprioception; DECR PP AND TEMP IN FEET. ABSENT VIB AT TOES. COORDINATION: finger-nose-finger, fine finger movements  normal REFLEXES: BUE 1, KNEES 1, ANKLES 0; DOWN GOING TOES.  GAIT/STATION: narrow based gait; able to walk on toes, heels and tandem; romberg is negative    DIAGNOSTIC DATA (LABS, IMAGING, TESTING) - I reviewed patient records, labs, notes, testing and imaging myself where available.  Lab Results  Component Value Date   WBC 3.1* 01/05/2014   HGB 14.1 01/05/2014   HCT 41.8 01/05/2014   MCV 96.2 01/05/2014   PLT 155.0 01/05/2014      Component Value Date/Time   NA 139 01/05/2014 0840   K 4.1 01/05/2014  0840   CL 100 01/05/2014 0840   CO2 32 01/05/2014 0840   GLUCOSE 101* 01/05/2014 0840   BUN 12 01/05/2014 0840   CREATININE 0.7 01/05/2014 0840   CALCIUM 9.1 01/05/2014 0840   PROT 6.3 01/05/2014 0840   ALBUMIN 4.5 01/05/2014 0840   AST 24 01/05/2014 0840   ALT 24 01/05/2014 0840   ALKPHOS 51 01/05/2014 0840   BILITOT 0.7 01/05/2014 0840   GFRNONAA >90 06/14/2011 0555   GFRAA >90 06/14/2011 0555   Lab Results  Component Value Date   CHOL 164 01/05/2014   HDL 85.50 01/05/2014   LDLCALC 74 01/05/2014   TRIG 24.0 01/05/2014   CHOLHDL 2 01/05/2014   No results found for this basename: HGBA1C   Lab Results  Component Value Date   VITAMINB12 575 01/11/2014   Lab Results  Component Value Date   TSH 2.36 01/05/2014     ASSESSMENT AND PLAN  68 y.o. year old male here with NUMBNESS in feet and legs since April 2014, but suddenly worse since April 2015. Exam consistent with neuropathy.  PLAN: - EMG/NCS; then possibly check neuropathy labs - no meds at this time (patient has no pain)  Return for for NCV/EMG.    Penni Bombard, MD 11/19/4732, 03:70 AM Certified in Neurology, Neurophysiology and Neuroimaging  [facility] [address], [place], [place] [address] [phone]

## 2014-03-24 MED ORDER — PHENOBARBITAL 60 MG PO TABS: 60.0000 mg | ORAL_TABLET | Freq: Every day | ORAL | Status: DC

## 2014-03-24 NOTE — Telephone Encounter (Signed)
Refill for 6 months. 

## 2014-03-24 NOTE — Telephone Encounter (Signed)
Faxed RX to mail order.  

## 2014-04-21 ENCOUNTER — Encounter (INDEPENDENT_AMBULATORY_CARE_PROVIDER_SITE_OTHER): Payer: Medicare Other | Admitting: Radiology

## 2014-04-21 ENCOUNTER — Ambulatory Visit (INDEPENDENT_AMBULATORY_CARE_PROVIDER_SITE_OTHER): Payer: Medicare Other | Admitting: Diagnostic Neuroimaging

## 2014-04-21 DIAGNOSIS — R200 Anesthesia of skin: Secondary | ICD-10-CM

## 2014-04-21 DIAGNOSIS — Z0289 Encounter for other administrative examinations: Secondary | ICD-10-CM

## 2014-04-21 DIAGNOSIS — R208 Other disturbances of skin sensation: Secondary | ICD-10-CM

## 2014-04-23 NOTE — Procedures (Signed)
   [facility]  NCS (NERVE CONDUCTION STUDY) WITH EMG (ELECTROMYOGRAPHY) REPORT   STUDY DATE: 04/21/14 PATIENT NAME: Nathan [name] DOB: 20-Mar-1946 MRN: [id number]  ORDERING CLINICIAN: Andrey Spearman, MD   TECHNOLOGIST: Towana Badger  ELECTROMYOGRAPHER: Earlean Polka. Penumalli, MD  CLINICAL INFORMATION: 68 year old male with lower extremity numbness.  FINDINGS: NERVE CONDUCTION STUDY: Left median, left ulnar, left peroneal motor responses and F wave latencies are normal. Right peroneal and left tibial motor responses have normal distal latencies, decreased amplitudes, normal conduction velocities and prolonged F wave latencies. Right tibial motor response is normal. Right tibial F-wave latency is prolonged. Left median, left ulnar, left radial sensory responses are normal. Bilateral sural sensory responses of decreased amplitude and normal conduction velocities.  NEEDLE ELECTROMYOGRAPHY: Needle examination of right lower extremity: Gluteus medius, vastus medialis - normal Tibialis anterior - 2+ spontaneous activity at rest and decreased motor unit recruitment on exertion  Tibialis posterior - 1+ spontaneous activity at rest and normal motor unit recruitment on exertion Gastrocnemius - 1+ spontaneous activity at rest and normal motor unit recruitment on exertion Right L4-5 - no abnormal spontaneous activity Right L5-S1 - 1+ spontaneous activity   IMPRESSION:  Abnormal study demonstrating: 1. Evidence of bilateral lumbar radiculopathies. 2. No evidence of underlying widespread polyneuropathy.   INTERPRETING PHYSICIAN:  Penni Bombard, MD Certified in Neurology, Neurophysiology and Neuroimaging  [facility] [address], [place], [place] [address] [phone]

## 2014-04-28 DIAGNOSIS — R200 Anesthesia of skin: Secondary | ICD-10-CM | POA: Insufficient documentation

## 2014-05-05 ENCOUNTER — Ambulatory Visit (INDEPENDENT_AMBULATORY_CARE_PROVIDER_SITE_OTHER): Payer: Medicare Other

## 2014-05-05 DIAGNOSIS — Z23 Encounter for immunization: Secondary | ICD-10-CM

## 2014-05-11 ENCOUNTER — Ambulatory Visit
Admission: RE | Admit: 2014-05-11 | Discharge: 2014-05-11 | Disposition: A | Discharge status: Home / Self Care | Payer: 59 | Source: Ambulatory Visit | Attending: Diagnostic Neuroimaging | Admitting: Diagnostic Neuroimaging

## 2014-05-11 DIAGNOSIS — R200 Anesthesia of skin: Secondary | ICD-10-CM

## 2014-06-01 ENCOUNTER — Telehealth: Payer: Self-pay | Admitting: Diagnostic Neuroimaging

## 2014-06-01 DIAGNOSIS — M5416 Radiculopathy, lumbar region: Secondary | ICD-10-CM

## 2014-06-01 NOTE — Telephone Encounter (Signed)
Patient requesting MRI results.  Please call and advise. °

## 2014-06-07 NOTE — Telephone Encounter (Signed)
I called patient with MRI lumbar spine results. Advised pt to try conservative mgmt for now. -VRP

## 2014-06-16 ENCOUNTER — Telehealth: Payer: Self-pay | Admitting: Diagnostic Neuroimaging

## 2014-06-16 NOTE — Telephone Encounter (Signed)
Patient is calling to get a referral to Dr. Erline Levine for patient's bulging disk and pinched nerve. It is ok to leave a message. Thank you.

## 2014-06-16 NOTE — Addendum Note (Signed)
Addended byAndrey Spearman on: 06/16/2014 05:37 PM   Modules accepted: Orders

## 2014-06-16 NOTE — Telephone Encounter (Signed)
Referral ordered. Let patient know. -VRP

## 2014-06-17 NOTE — Telephone Encounter (Signed)
Called patient to let him know referral ordered. No answer. No vmail left.

## 2014-07-23 ENCOUNTER — Ambulatory Visit: Payer: Medicare Other | Admitting: Diagnostic Neuroimaging

## 2014-08-02 ENCOUNTER — Ambulatory Visit: Payer: Medicare Other | Admitting: Diagnostic Neuroimaging

## 2014-08-19 ENCOUNTER — Ambulatory Visit (INDEPENDENT_AMBULATORY_CARE_PROVIDER_SITE_OTHER): Payer: Medicare Other | Admitting: Family Medicine

## 2014-08-19 ENCOUNTER — Encounter: Payer: Self-pay | Admitting: Family Medicine

## 2014-08-19 ENCOUNTER — Ambulatory Visit: Payer: Self-pay | Admitting: Family Medicine

## 2014-08-19 VITALS — BP 120/72 | HR 65 | Temp 98.0°F | Wt 154.0 lb

## 2014-08-19 DIAGNOSIS — Z87898 Personal history of other specified conditions: Secondary | ICD-10-CM

## 2014-08-19 DIAGNOSIS — E538 Deficiency of other specified B group vitamins: Secondary | ICD-10-CM

## 2014-08-19 DIAGNOSIS — R937 Abnormal findings on diagnostic imaging of other parts of musculoskeletal system: Secondary | ICD-10-CM

## 2014-08-19 DIAGNOSIS — Z8669 Personal history of other diseases of the nervous system and sense organs: Secondary | ICD-10-CM

## 2014-08-19 MED ORDER — PHENOBARBITAL 60 MG PO TABS: 60.0000 mg | ORAL_TABLET | Freq: Every day | ORAL | Status: DC

## 2014-08-19 NOTE — Progress Notes (Signed)
Subjective:    Patient ID: Nathan [name], male    DOB: Dec 31, 1945, 69 y.o.   MRN: [id number]  HPI   Patient here to discuss recent abnormal MRI lumbar spine. He's had some bilateral lower extremity numbness. No history of diabetes. He went to neurologist and had MRI lumbar spine without contrast. This showed mild multilevel lumbar disc degeneration but no major foraminal stenosis. He had nonspecific finding of diffuse bone marrow heterogeneity. Radiologist mentioned considerations including anemia, smoking, or infiltrate or process such as multiple myeloma. Patient has never smoked. No history of anemia. His father did have multiple myeloma.  Patient has not had any recent appetite or weight changes. No night sweats. No bone pains. No low back pain. He had EMG studies per neurology which suggested lumbar radiculopathy. He has also been seen by neurosurgeon who did not feel like he had any major lumbar radiculopathy issues. He has some numbness involving both feet but overall this is improving. He does have history of B12 deficiency and is currently using sublingual replacement. B12 level last summer was in the normal range.  Past Medical History  Diagnosis Date  . DYSPNEA 02/09/2009  . RESTLESS LEGS SYNDROME 02/28/2009  . SEIZURE DISORDER 02/09/2009    last seisure Sep 10, 2001  . Colon polyps     Sessile Serrated adenomas   Past Surgical History  Procedure Laterality Date  . Laparoscopic appendectomy  06/01/2011    Procedure: APPENDECTOMY LAPAROSCOPIC;  Surgeon: Shann Medal, MD;  Location: [facility];  Service: General;  Laterality: N/A;  . Hernia repair Right 02/11/09    dr. Donella Stade -[facility]  . Appendectomy  06/01/11    reports that he has never smoked. He has never used smokeless tobacco. He reports that he does not drink alcohol or use illicit drugs. family history includes Cancer in his father; Ovarian cancer in his mother. There is no history of Stroke, Colon cancer, Esophageal cancer,  Rectal cancer, or Stomach cancer. Allergies  Allergen Reactions  . Carbamazepine     REACTION: fatigue, sleepy  . Ciprofloxacin     Per pt, "thinks caused rash"  . Metronidazole     REACTION: GI upset  . Penicillins     REACTION: childhood, hives?  . Phenytoin     REACTION: rash, leg swelling  . Bactrim Other (See Comments)    Caused headaches, that progressively worsened when on medication. Once off, they went away.  . Phenytoin Sodium Extended Swelling and Rash    Swelling of ankles. Happened years ago per patient.      Review of Systems  Constitutional: Negative for fever, chills, appetite change, fatigue and unexpected weight change.  Eyes: Negative for visual disturbance.  Respiratory: Negative for cough, chest tightness and shortness of breath.   Cardiovascular: Negative for chest pain, palpitations and leg swelling.  Gastrointestinal: Negative for abdominal pain.  Musculoskeletal: Negative for back pain and neck pain.  Neurological: Positive for numbness. Negative for dizziness, syncope, weakness, light-headedness and headaches.       Objective:   Physical Exam  Constitutional: He is oriented to person, place, and time. He appears well-developed and well-nourished.  Neck: Neck supple. No thyromegaly present.  Cardiovascular: Normal rate and regular rhythm.   Pulmonary/Chest: Effort normal and breath sounds normal. No respiratory distress. He has no wheezes. He has no rales.  Musculoskeletal: He exhibits no edema.  No spinal tenderness  Lymphadenopathy:    He has no cervical adenopathy.  Neurological: He is alert  and oriented to person, place, and time. No cranial nerve deficit.          Assessment & Plan:  Abnormal MRI scan as above. Patient does not have any worrisome things from history such as appetite or weight changes. There was question raised of ruling out infiltrative process such as multiple myeloma based on appearance of MRI but this was very  nonspecific. Will pain screening labs including CBC, basic metabolic panel, sedimentation rate, serum protein electrophoresis, urine protein electrophoresis. Also repeat B12 levels. He has follow-up with neurology scheduled  History of seizure disorder. Patient requesting refills phenobarbital. No seizure in many years

## 2014-08-19 NOTE — Progress Notes (Signed)
Pre visit review using our clinic review tool, if applicable. No additional management support is needed unless otherwise documented below in the visit note. 

## 2014-08-20 LAB — CBC WITH DIFFERENTIAL/PLATELET
BASOS ABS: 0 10*3/uL (ref 0.0–0.1)
Basophils Relative: 0.4 % (ref 0.0–3.0)
EOS ABS: 0.1 10*3/uL (ref 0.0–0.7)
EOS PCT: 2.2 % (ref 0.0–5.0)
HCT: 38.1 % — ABNORMAL LOW (ref 39.0–52.0)
Hemoglobin: 13.4 g/dL (ref 13.0–17.0)
LYMPHS PCT: 34.5 % (ref 12.0–46.0)
Lymphs Abs: 1.2 10*3/uL (ref 0.7–4.0)
MCHC: 35.1 g/dL (ref 30.0–36.0)
MCV: 92.4 fl (ref 78.0–100.0)
Monocytes Absolute: 0.5 10*3/uL (ref 0.1–1.0)
Monocytes Relative: 16 % — ABNORMAL HIGH (ref 3.0–12.0)
NEUTROS ABS: 1.6 10*3/uL (ref 1.4–7.7)
Neutrophils Relative %: 46.9 % (ref 43.0–77.0)
Platelets: 161 10*3/uL (ref 150.0–400.0)
RBC: 4.12 Mil/uL — AB (ref 4.22–5.81)
RDW: 13.7 % (ref 11.5–15.5)
WBC: 3.4 10*3/uL — ABNORMAL LOW (ref 4.0–10.5)

## 2014-08-20 LAB — HEPATIC FUNCTION PANEL
ALBUMIN: 4.2 g/dL (ref 3.5–5.2)
ALT: 27 U/L (ref 0–53)
AST: 26 U/L (ref 0–37)
Alkaline Phosphatase: 60 U/L (ref 39–117)
Bilirubin, Direct: 0.1 mg/dL (ref 0.0–0.3)
TOTAL PROTEIN: 6.2 g/dL (ref 6.0–8.3)
Total Bilirubin: 0.3 mg/dL (ref 0.2–1.2)

## 2014-08-20 LAB — VITAMIN B12: VITAMIN B 12: 678 pg/mL (ref 211–911)

## 2014-08-20 LAB — BASIC METABOLIC PANEL
BUN: 16 mg/dL (ref 6–23)
CO2: 34 mEq/L — ABNORMAL HIGH (ref 19–32)
Calcium: 9.3 mg/dL (ref 8.4–10.5)
Chloride: 102 mEq/L (ref 96–112)
Creatinine, Ser: 0.7 mg/dL (ref 0.40–1.50)
GFR: 119.37 mL/min (ref >60.00)
Glucose, Bld: 94 mg/dL (ref 70–99)
Potassium: 4.1 mEq/L (ref 3.5–5.1)
Sodium: 138 mEq/L (ref 135–145)

## 2014-08-20 LAB — SEDIMENTATION RATE: SED RATE: 3 mm/h (ref 0–22)

## 2014-08-25 LAB — PROTEIN ELECTROPHORESIS, SERUM
ALBUMIN ELP: 68.7 % — AB (ref 55.8–66.1)
ALPHA-2-GLOBULIN: 7.8 % (ref 7.1–11.8)
Alpha-1-Globulin: 3.7 % (ref 2.9–4.9)
BETA 2: 3.5 % (ref 3.2–6.5)
Beta Globulin: 5.3 % (ref 4.7–7.2)
Gamma Globulin: 11 % — ABNORMAL LOW (ref 11.1–18.8)
TOTAL PROTEIN, SERUM ELECTROPHOR: 6.4 g/dL (ref 6.0–8.3)

## 2014-08-25 LAB — PROTEIN ELECTROPHORESIS, URINE REFLEX

## 2014-08-27 ENCOUNTER — Ambulatory Visit: Payer: Self-pay | Admitting: Family Medicine

## 2014-09-15 ENCOUNTER — Ambulatory Visit (INDEPENDENT_AMBULATORY_CARE_PROVIDER_SITE_OTHER): Payer: Medicare Other | Admitting: Neurology

## 2014-09-15 ENCOUNTER — Encounter: Payer: Self-pay | Admitting: Neurology

## 2014-09-15 VITALS — BP 110/64 | HR 61 | Ht 71.0 in | Wt 152.5 lb

## 2014-09-15 DIAGNOSIS — G40909 Epilepsy, unspecified, not intractable, without status epilepticus: Secondary | ICD-10-CM

## 2014-09-15 DIAGNOSIS — G609 Hereditary and idiopathic neuropathy, unspecified: Secondary | ICD-10-CM

## 2014-09-15 DIAGNOSIS — R202 Paresthesia of skin: Secondary | ICD-10-CM

## 2014-09-15 NOTE — Progress Notes (Signed)
Note faxed.

## 2014-09-15 NOTE — Patient Instructions (Signed)
1.  Check vitamin B1, vitamin B6, copper, zinc  2.  Continue home exercises for balance 3.  If symptoms worsen, please call my office to schedule an appointment

## 2014-09-15 NOTE — Progress Notes (Signed)
[facility] Clinic Note - Initial Visit   Date: 09/15/2014   Nathan [name] MRN: [id number] DOB: [birth year]   Dear Dr. Elease Hashimoto:  Thank you for your kind referral of Nathan [name] for consultation of bilateral feet numbness. Although his history is well known to you, please allow [place] to reiterate it for the purpose of our medical record. The patient was accompanied to the clinic by self.    History of Present Illness: Nathan [name] is a 69 y.o. right-handed Caucasian male with seizure disorder (maintained on PHB, seizure free since 2003) and restless leg syndrome presenting for evaluation of bilateral feet numbness.    Starting around 2014, patient developed numbness/tingling of the soles of his feet which was constant.  He would wear compression stockings for varicose veins and noticed the numbness when he would take them off in the evening and when he would shower.  Six months prior to symptom onset, he was getting varicose vein injections throughout his lower extremities, more on the right. His last treatment was in October 2014.   In April 2015, his numbness suddenly worsened to involve his lower legs and knees, worse on the right.  Over the past few months, his leg numbness has improved some.  There is no associated weakness, low back pain, or bowel/bladder dysfunction.  He endorses mild problems with balance and has been doing home balance exercises.  He is on a very strict low-sugar, low-salt, vegetarian diet and reports that since taking vitamin B12 oral supplements, symptoms may have improved.  Recent blood working including vitamin B12 level is normal.  Patient was seen at [facility] by Dr. Leta Baptist in September 2015 for bilateral feet numbness.  EMG of the leg showed lumbar radiculopathy so he ordered MRI lumbar spine which showed multilevel degenerative changes, without foraminal stenosis.  Patient sought the opinion of Dr. Vertell Limber for his lumbar radiculopathy who  did not feel that symptoms were due to lumbar disease, and instead was concerning about neuropathy, so referred him for second opinion.    Of note, he also has a history of seizure disorder which started at age 67 years old. 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  He was first placed on Dilantin, but due to ankle swelling he was switched to tegretol, but this caused him to be sleepy. He was transitioned to phenobarbital 30 mg which controlled his seizures. He had a second seizure in 1997 but then was seizure free until February 2003 during a period when he was taken off phenobarbital for 3-4 months prior, due to long period of seizure freedom. After his last breakthrough seizure he has been on phenobarbital 60 mg at night.   Out-side paper records, electronic medical record, and images have been reviewed where available and summarized as:  EMG performed by Dr. Leta Baptist at [facility] 04/21/2014: Abnormal study demonstrating: 1. Evidence of bilateral lumbar radiculopathies. 2. No evidence of underlying widespread polyneuropathy.  MRI lumbar spine 05/11/2014: 1. Mild multilevel lumbar disc degeneration and facet arthrosis resulting in mild bilateral lateral recess stenosis at L3-4 and L4-5 and moderate left neural foraminal stenosis at L3-4. No spinal canal stenosis. 2. Diffuse bone marrow heterogeneity, nonspecific. Considerations include anemia, smoking, and infiltrative processes (including multiple myeloma).  Lab Results  Component Value Date   TSH 2.36 01/05/2014   Lab Results  Component Value Date   VITAMINB12 678 08/19/2014  Past Medical History  Diagnosis Date  . DYSPNEA 02/09/2009  . RESTLESS LEGS SYNDROME 02/28/2009  . SEIZURE DISORDER 02/09/2009    last seisure Sep 10, 2001  . Colon polyps     Sessile Serrated adenomas    Past Surgical History  Procedure Laterality Date  . Laparoscopic appendectomy  06/01/2011     Procedure: APPENDECTOMY LAPAROSCOPIC;  Surgeon: Shann Medal, MD;  Location: [facility];  Service: General;  Laterality: N/A;  . Hernia repair Right 02/11/09    dr. Donella Stade -[facility]  . Appendectomy  06/01/11     Medications:  Current Outpatient Prescriptions on File Prior to Visit  Medication Sig Dispense Refill  . Calcium-Magnesium-Zinc 167-83-8 MG TABS Take 3 tablets by mouth daily.    . cholecalciferol (VITAMIN D) 1000 UNITS tablet Take 1,000 Units by mouth daily.      . Cod Liver Oil OIL Take 5 mLs by mouth daily.      . Coenzyme Q10 (CO Q 10) 60 MG CAPS Take 1 tablet by mouth daily.      . cyanocobalamin 500 MCG tablet Take 1,000 mcg by mouth daily.    . Homeopathic Products (SIMILASAN DRY EYE RELIEF) SOLN Apply 1 drop to eye daily as needed. Dry Eyes     . hyoscyamine (LEVSIN SL) 0.125 MG SL tablet Place 1 tablet (0.125 mg total) under the tongue every 4 (four) hours as needed. 30 tablet 0  . PHENObarbital (LUMINAL) 60 MG tablet Take 1 tablet (60 mg total) by mouth at bedtime. 90 tablet 3  . Probiotic Product (PROBIOTIC DAILY PO) Take by mouth daily.    . vitamin C (ASCORBIC ACID) 500 MG tablet Take 500 mg by mouth 3 (three) times daily.       No current facility-administered medications on file prior to visit.    Allergies:  Allergies  Allergen Reactions  . Carbamazepine     REACTION: fatigue, sleepy  . Ciprofloxacin     Per pt, "thinks caused rash"  . Metronidazole     REACTION: GI upset  . Penicillins     REACTION: childhood, hives?  . Phenytoin     REACTION: rash, leg swelling  . Sulfamethoxazole Other (See Comments)  . Trimethoprim Other (See Comments)  . Bactrim Other (See Comments)    Caused headaches, that progressively worsened when on medication. Once off, they went away.  . Phenytoin Sodium Extended Swelling and Rash    Swelling of ankles. Happened years ago per patient.    Family History: Family History  Problem Relation Age of Onset  . Cancer Mother     . Cancer Father     multiple myeloma  . Stroke Neg Hx     grandparent  . Colon cancer Neg Hx   . Esophageal cancer Neg Hx   . Rectal cancer Neg Hx   . Stomach cancer Neg Hx   . Diabetes Mellitus II Father     Social History: History   Social History  . Marital Status: Married    Spouse Name: Thayer Headings  . Number of Children: 0  . Years of Education: 2 MA's    Occupational History  . Retired Pharmacist, hospital   .  Other    Part Time: [employer]   Social History Main Topics  . Smoking status: Never Smoker   . Smokeless tobacco: Never Used  . Alcohol Use: No  . Drug Use: No  . Sexual Activity: Not on file   Other Topics Concern  .  Not on file   Social History Narrative   Patient lives at home with spouse.   Caffeine Use: quit 1980   Lives in a one story home. No chidren.   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   EYES: No visual changes or eye pain ENT: No hearing changes.  No history of nose bleeds.   RESPIRATORY: No cough, wheezing and shortness of breath.   CARDIOVASCULAR: Negative for chest pain, and palpitations.   GI: Negative for abdominal discomfort, blood in stools or black stools.  No recent change in bowel habits.   GU:  No history of incontinence.   MUSCLOSKELETAL: No history of joint pain or swelling.  No myalgias.   SKIN: Negative for lesions, rash, and itching.   HEMATOLOGY/ONCOLOGY: Negative for prolonged bleeding, bruising easily, and swollen nodes.  No history of cancer.   ENDOCRINE: Negative for cold or heat intolerance, polydipsia or goiter.   PSYCH:  No depression or anxiety symptoms.   NEURO: As Above.   Vital Signs:  BP 110/64 mmHg  Pulse 61  Ht $R'5\' 11"'bA$  (1.803 m)  Wt 152 lb 8 oz (69.174 kg)  BMI 21.28 kg/m2  SpO2 98%   General Medical Exam:   General:  Well appearing, comfortable.   Eyes/ENT: see cranial nerve  examination.   Neck: No masses appreciated.  Full range of motion without tenderness.  No carotid bruits. Respiratory:  Clear to auscultation, good air entry bilaterally.   Cardiac:  Regular rate and rhythm, no murmur.   Extremities:  No deformities, edema, or skin discoloration.  Skin:  Reduced hair growth in his lower legs   Neurological Exam: MENTAL STATUS including orientation to time, place, person, recent and remote memory, attention span and concentration, language, and fund of knowledge is normal.  Speech is not dysarthric.  CRANIAL NERVES: II:  No visual field defects.  Unremarkable fundi.   III-IV-VI: Pupils equal round and reactive to light.  Normal conjugate, extra-ocular eye movements in all directions of gaze.  No nystagmus.  No ptosis.   V:  Normal facial sensation.    VII:  Normal facial symmetry and movements.  No pathologic facial reflexes.  VIII:  Normal hearing and vestibular function.   IX-X:  Normal palatal movement.   XI:  Normal shoulder shrug and head rotation.   XII:  Normal tongue strength and range of motion, no deviation or fasciculation.  MOTOR:  No atrophy, fasciculations or abnormal movements.  No pronator drift.  Tone is normal.    Right Upper Extremity:    Left Upper Extremity:    Deltoid  5/5   Deltoid  5/5   Biceps  5/5   Biceps  5/5   Triceps  5/5   Triceps  5/5   Wrist extensors  5/5   Wrist extensors  5/5   Wrist flexors  5/5   Wrist flexors  5/5   Finger extensors  5/5   Finger extensors  5/5   Finger flexors  5/5   Finger flexors  5/5   Dorsal interossei  5/5   Dorsal interossei  5/5   Abductor pollicis  5/5   Abductor pollicis  5/5   Tone (Ashworth scale)  0  Tone (Ashworth scale)  0   Right Lower Extremity:    Left Lower Extremity:    Hip flexors  5/5   Hip flexors  5/5   Hip extensors  5/5   Hip extensors  5/5   Knee flexors  5/5   Knee flexors  5/5   Knee extensors  5/5   Knee extensors  5/5   Dorsiflexors  5/5   Dorsiflexors  5/5     Plantarflexors  5/5   Plantarflexors  5/5   Toe extensors  5-/5   Toe extensors  5-/5   Toe flexors  5-/5   Toe flexors  5-/5   Tone (Ashworth scale)  0  Tone (Ashworth scale)  0   MSRs:  Right                                                                 Left brachioradialis 2+  brachioradialis 2+  biceps 2+  biceps 2+  triceps 2+  triceps 2+  patellar 2+  patellar 2+  ankle jerk 0  ankle jerk 0  Hoffman no  Hoffman no  plantar response down  plantar response down   SENSORY:  Reduced vibration distal to ankles (trace) and absent at the great toe bilaterally.  Proprioception is impaired on the right at the great toe.  Hyperesthesia to pin prick distal to mid-leg and into foot.   Romberg's sign absent.   COORDINATION/GAIT: Normal finger-to- nose-finger and heel-to-shin.  Intact rapid alternating movements bilaterally.  Able to rise from a chair without using arms.  Gait narrow based and stable. He is able to perform stressed gait, but appears unsteady with tandem gait.   IMPRESSION: Mr. Sharron is a delightful 69 year-old gentleman presenting for evaluation of bilateral feet paresthesias.  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 I had extensive discussion with the patient regarding the pathogenesis, etiology, management, and natural course of neuropathy. Neuropathy tends to be slowly progressive, especially if a treatable etiology is not identified.  I would like to test for treatable causes of neuropathy. I discussed that in the vast majority of cases, despite checking for reversible causes, we are unable to find the underlying etiology and management is symptomatic.    I have personally reviewed his prior work-up including MRI lumbar spine which shows age-related degenerative changes, without focal nerve impingement.  There is note of diffuse bone marrow heterogeniety and screening for paraproteinemia by his PCP has returned normal, so  multiple myeloma is less likely.  His EMG reports states no evidence of neuropathy, but NCS portion shows bilateral sural sensory responses are decreased in amplitude, as what would be expected in peripheral neuropathy.  He also has a history of grand mal seizures, last occuring in 2003, which is well controlled on phenobarbitol $RemoveBeforeDE'60mg'[id number]$  daily.   PLAN/RECOMMENDATIONS:  1.  Check vitamin B1, vitamin B6, copper, zinc  2.  Continue home exercises for balance 3.  I offered patient a follow-up appointment, but he is satisfied with today's visit and will contact us to schedule return visit if symptoms worsen   The duration of this appointment visit was 50 minutes of face-to-face time with the patient.  Greater than 50% of this time was spent in counseling, explanation of diagnosis, planning of further management, and coordination of care.   Thank you for allowing me to participate in patient's care.  If I can answer any  additional questions, I would be pleased to do so.    Sincerely,    Donika K. Posey Pronto, DO

## 2014-09-17 LAB — ZINC: ZINC: 62 ug/dL (ref 60–130)

## 2014-09-17 LAB — COPPER, SERUM: COPPER: 80 ug/dL (ref 70–175)

## 2014-09-18 LAB — VITAMIN B1: Vitamin B1 (Thiamine): 14 nmol/L (ref 8–30)

## 2014-09-19 LAB — VITAMIN B6: Vitamin B6: 15.5 ng/mL (ref 2.1–21.7)

## 2014-09-21 ENCOUNTER — Telehealth: Payer: Self-pay | Admitting: Neurology

## 2014-09-21 ENCOUNTER — Encounter: Payer: Self-pay | Admitting: *Deleted

## 2014-09-21 NOTE — Telephone Encounter (Signed)
Pt called stating that he is returning your call. C/b [phone]

## 2014-09-21 NOTE — Telephone Encounter (Signed)
I called patient back but he could not come to the phone.  I left a message with the receptionist to let him know that everything was fine and a letter was sent.

## 2014-11-23 ENCOUNTER — Emergency Department (HOSPITAL_COMMUNITY)
Admission: EM | Admit: 2014-11-23 | Discharge: 2014-11-23 | Disposition: A | Discharge status: Home / Self Care | Payer: Medicare Other | Attending: Emergency Medicine | Admitting: Emergency Medicine

## 2014-11-23 ENCOUNTER — Emergency Department (HOSPITAL_COMMUNITY): Payer: Medicare Other

## 2014-11-23 ENCOUNTER — Encounter (HOSPITAL_COMMUNITY): Payer: Self-pay | Admitting: *Deleted

## 2014-11-23 DIAGNOSIS — R110 Nausea: Secondary | ICD-10-CM | POA: Diagnosis not present

## 2014-11-23 DIAGNOSIS — Z880 Allergy status to penicillin: Secondary | ICD-10-CM | POA: Diagnosis not present

## 2014-11-23 DIAGNOSIS — G40909 Epilepsy, unspecified, not intractable, without status epilepticus: Secondary | ICD-10-CM | POA: Diagnosis not present

## 2014-11-23 DIAGNOSIS — R1032 Left lower quadrant pain: Secondary | ICD-10-CM | POA: Diagnosis present

## 2014-11-23 DIAGNOSIS — Z79899 Other long term (current) drug therapy: Secondary | ICD-10-CM | POA: Diagnosis not present

## 2014-11-23 DIAGNOSIS — Z86010 Personal history of colonic polyps: Secondary | ICD-10-CM | POA: Diagnosis not present

## 2014-11-23 DIAGNOSIS — K5900 Constipation, unspecified: Secondary | ICD-10-CM

## 2014-11-23 LAB — URINALYSIS, ROUTINE W REFLEX MICROSCOPIC
Bilirubin Urine: NEGATIVE
GLUCOSE, UA: NEGATIVE mg/dL
HGB URINE DIPSTICK: NEGATIVE
KETONES UR: NEGATIVE mg/dL
Leukocytes, UA: NEGATIVE
NITRITE: NEGATIVE
Protein, ur: NEGATIVE mg/dL
SPECIFIC GRAVITY, URINE: 1.01 (ref 1.005–1.030)
Urobilinogen, UA: 0.2 mg/dL (ref 0.0–1.0)
pH: 7.5 (ref 5.0–8.0)

## 2014-11-23 LAB — COMPREHENSIVE METABOLIC PANEL
ALK PHOS: 62 U/L (ref 38–126)
ALT: 27 U/L (ref 17–63)
AST: 29 U/L (ref 15–41)
Albumin: 4.4 g/dL (ref 3.5–5.0)
Anion gap: 11 (ref 5–15)
BILIRUBIN TOTAL: 0.9 mg/dL (ref 0.3–1.2)
BUN: 15 mg/dL (ref 6–20)
CALCIUM: 9.2 mg/dL (ref 8.9–10.3)
CO2: 29 mmol/L (ref 22–32)
Chloride: 99 mmol/L — ABNORMAL LOW (ref 101–111)
Creatinine, Ser: 0.78 mg/dL (ref 0.61–1.24)
GFR calc Af Amer: >60 mL/min (ref >60)
GFR calc non Af Amer: >60 mL/min (ref >60)
Glucose, Bld: 134 mg/dL — ABNORMAL HIGH (ref 70–99)
Potassium: 3.7 mmol/L (ref 3.5–5.1)
SODIUM: 139 mmol/L (ref 135–145)
TOTAL PROTEIN: 6.5 g/dL (ref 6.5–8.1)

## 2014-11-23 LAB — CBC WITH DIFFERENTIAL/PLATELET
BASOS ABS: 0 10*3/uL (ref 0.0–0.1)
Basophils Relative: 1 % (ref 0–1)
EOS ABS: 0 10*3/uL (ref 0.0–0.7)
Eosinophils Relative: 0 % (ref 0–5)
HEMATOCRIT: 40.6 % (ref 39.0–52.0)
Hemoglobin: 13.7 g/dL (ref 13.0–17.0)
LYMPHS ABS: 0.6 10*3/uL — AB (ref 0.7–4.0)
LYMPHS PCT: 12 % (ref 12–46)
MCH: 31.9 pg (ref 26.0–34.0)
MCHC: 33.7 g/dL (ref 30.0–36.0)
MCV: 94.4 fL (ref 78.0–100.0)
MONO ABS: 0.6 10*3/uL (ref 0.1–1.0)
MONOS PCT: 14 % — AB (ref 3–12)
Neutro Abs: 3.3 10*3/uL (ref 1.7–7.7)
Neutrophils Relative %: 73 % (ref 43–77)
Platelets: 142 10*3/uL — ABNORMAL LOW (ref 150–400)
RBC: 4.3 MIL/uL (ref 4.22–5.81)
RDW: 12.9 % (ref 11.5–15.5)
WBC: 4.5 10*3/uL (ref 4.0–10.5)

## 2014-11-23 LAB — URINE MICROSCOPIC-ADD ON

## 2014-11-23 MED ORDER — IOHEXOL 300 MG/ML  SOLN
50.0000 mL | Freq: Once | INTRAMUSCULAR | Status: AC | PRN
  Administered 2014-11-23: 50 mL via ORAL

## 2014-11-23 MED ORDER — HYDROMORPHONE HCL 1 MG/ML IJ SOLN: 0.5000 mg | Freq: Once | INTRAMUSCULAR | Status: DC

## 2014-11-23 MED ORDER — POLYETHYLENE GLYCOL 3350 17 GM/SCOOP PO POWD: 1.0000 | Freq: Two times a day (BID) | ORAL | Status: DC

## 2014-11-23 MED ORDER — HYDROMORPHONE HCL 1 MG/ML IJ SOLN
0.5000 mg | Freq: Once | INTRAMUSCULAR | Status: AC
  Administered 2014-11-23: 0.5 mg via INTRAVENOUS
  Filled 2014-11-23: qty 1

## 2014-11-23 MED ORDER — IOHEXOL 300 MG/ML  SOLN
100.0000 mL | Freq: Once | INTRAMUSCULAR | Status: AC | PRN
  Administered 2014-11-23: 100 mL via INTRAVENOUS

## 2014-11-23 MED ORDER — ONDANSETRON HCL 4 MG/2ML IJ SOLN
4.0000 mg | Freq: Once | INTRAMUSCULAR | Status: AC
  Administered 2014-11-23: 4 mg via INTRAVENOUS
  Filled 2014-11-23: qty 2

## 2014-11-23 NOTE — ED Provider Notes (Signed)
Patient originally seen by Dr. Sharol Given and [facility] PA-C.  He is waiting for CT of abdomen and pelvis after acute onset of severe LLQ abdominal pain that woke him from his sleep.   Results for orders placed or performed during the hospital encounter of 11/23/14  CBC with Differential  Result Value Ref Range   WBC 4.5 4.0 - 10.5 K/uL   RBC 4.30 4.22 - 5.81 MIL/uL   Hemoglobin 13.7 13.0 - 17.0 g/dL   HCT 40.6 39.0 - 52.0 %   MCV 94.4 78.0 - 100.0 fL   MCH 31.9 26.0 - 34.0 pg   MCHC 33.7 30.0 - 36.0 g/dL   RDW 12.9 11.5 - 15.5 %   Platelets 142 (L) 150 - 400 K/uL   Neutrophils Relative % 73 43 - 77 %   Neutro Abs 3.3 1.7 - 7.7 K/uL   Lymphocytes Relative 12 12 - 46 %   Lymphs Abs 0.6 (L) 0.7 - 4.0 K/uL   Monocytes Relative 14 (H) 3 - 12 %   Monocytes Absolute 0.6 0.1 - 1.0 K/uL   Eosinophils Relative 0 0 - 5 %   Eosinophils Absolute 0.0 0.0 - 0.7 K/uL   Basophils Relative 1 0 - 1 %   Basophils Absolute 0.0 0.0 - 0.1 K/uL  Comprehensive metabolic panel  Result Value Ref Range   Sodium 139 135 - 145 mmol/L   Potassium 3.7 3.5 - 5.1 mmol/L   Chloride 99 (L) 101 - 111 mmol/L   CO2 29 22 - 32 mmol/L   Glucose, Bld 134 (H) 70 - 99 mg/dL   BUN 15 6 - 20 mg/dL   Creatinine, Ser 0.78 0.61 - 1.24 mg/dL   Calcium 9.2 8.9 - 10.3 mg/dL   Total Protein 6.5 6.5 - 8.1 g/dL   Albumin 4.4 3.5 - 5.0 g/dL   AST 29 15 - 41 U/L   ALT 27 17 - 63 U/L   Alkaline Phosphatase 62 38 - 126 U/L   Total Bilirubin 0.9 0.3 - 1.2 mg/dL   GFR calc non Af Amer >60 >60 mL/min   GFR calc Af Amer >60 >60 mL/min   Anion gap 11 5 - 15  Urinalysis, Routine w reflex microscopic  Result Value Ref Range   Color, Urine YELLOW YELLOW   APPearance TURBID (A) CLEAR   Specific Gravity, Urine 1.010 1.005 - 1.030   pH 7.5 5.0 - 8.0   Glucose, UA NEGATIVE NEGATIVE mg/dL   Hgb urine dipstick NEGATIVE NEGATIVE   Bilirubin Urine NEGATIVE NEGATIVE   Ketones, ur NEGATIVE NEGATIVE mg/dL   Protein, ur NEGATIVE NEGATIVE mg/dL   Urobilinogen, UA 0.2 0.0 - 1.0 mg/dL   Nitrite NEGATIVE NEGATIVE   Leukocytes, UA NEGATIVE NEGATIVE  Urine microscopic-add on  Result Value Ref Range   Urine-Other AMORPHOUS URATES/PHOSPHATES    Ct Abdomen Pelvis W Contrast  11/23/2014   CLINICAL DATA:  LEFT lower quadrant pain radiating to flank since 0230 hours, nausea, normal white blood cell count  EXAM: CT ABDOMEN AND PELVIS WITH CONTRAST  TECHNIQUE: Multidetector CT imaging of the abdomen and pelvis was performed using the standard protocol following bolus administration of intravenous contrast. Sagittal and coronal MPR images reconstructed from axial data set.  CONTRAST:  183mL OMNIPAQUE IOHEXOL 300 MG/ML SOLN IV. Dilute oral contrast.  COMPARISON:  06/17/2011  FINDINGS: Minimal dependent bibasilar atelectasis.  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  No definite urinary tract calcification or ureteral dilatation.  LEFT pelvic phleboliths  unchanged.  Tiny probable cyst upper pole LEFT kidney.  Minimal prostatic enlargement with gland measuring 5.2 x 3.9 cm image 81.  Liver, gallbladder, spleen, pancreas, and adrenal glands normal.  Appendix not identified but no previous cecal inflammatory process seen.  Stomach and bowel loops unremarkable.  No mass, adenopathy, free fluid or free air.  Bones demineralized.  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  No scratch evidence of ureteral calcification or dilatation.  Minimal prostatic enlargement.   Electronically Signed   By: Lavonia Dana M.D.   On: 11/23/2014 09:26   Dg Abd Acute W/chest  11/23/2014   CLINICAL DATA:  Mid to left lower abdominal pain and nausea and vomiting since 0200 hours this morning.  EXAM: DG ABDOMEN ACUTE W/ 1V CHEST  COMPARISON:  CT abdomen and pelvis 06/17/2011. Abdomen 06/08/2011. Chest 06/02/2011.  FINDINGS: Normal heart size and pulmonary vascularity. Emphysematous changes in the lungs. No focal airspace  disease or consolidation. No blunting of costophrenic angles. No pneumothorax. Mediastinal contours appear intact.  Stool-filled colon. No small or large bowel distention. No free intra-abdominal air. No abnormal air-fluid levels. No radiopaque stones. Degenerative changes in the spine.  IMPRESSION: No evidence of active pulmonary disease. Nonobstructive bowel gas pattern with diffusely stool-filled colon.   Electronically Signed   By: Lucienne Capers M.D.   On: 11/23/2014 05:59     Medications  HYDROmorphone (DILAUDID) injection 0.5 mg (0 mg Intravenous Hold 11/23/14 0841)  HYDROmorphone (DILAUDID) injection 0.5 mg (0.5 mg Intravenous Given 11/23/14 0601)  ondansetron (ZOFRAN) injection 4 mg (4 mg Intravenous Given 11/23/14 0600)  iohexol (OMNIPAQUE) 300 MG/ML solution 50 mL (50 mLs Oral Contrast Given 11/23/14 0717)  iohexol (OMNIPAQUE) 300 MG/ML solution 100 mL (100 mLs Intravenous Contrast Given 11/23/14 0848)    After the first dose of pain medications at 8:30am, the patient reports no longer having anny pain. He declined offer for further pain medications. His plan film showed diffuse stool throughout colon, CT scan shows no acute findings. His wife and him have been increasing the amount of fiber in the diet. He denies no diuretics and no new medications.  Discussed use of Miralax, BID until bowel movements regular and soft again than DC Miralax and follow-up with his PCP. He reports his wife has also been having constipation lately and they will need to re-evaluate their diet.  69 y.o.Germain Koopmann CWCBJ'S evaluation in the Emergency Department is complete. It has been determined that no acute conditions requiring further emergency intervention are present at this time. The patient/guardian have been advised of the diagnosis and plan. We have discussed signs and symptoms that warrant return to the ED, such as changes or worsening in symptoms.  Vital signs are stable at discharge. Filed Vitals:    11/23/14 0504  BP: 139/63  Pulse: 63  Temp: 98.1 F (36.7 C)  Resp: 20    Patient/guardian has voiced understanding and agreed to follow-up with the PCP or specialist.   Delos Haring, PA-C 11/23/14 [place], DO 11/23/14 1434

## 2014-11-23 NOTE — Discharge Instructions (Signed)

## 2014-11-23 NOTE — ED Notes (Signed)
Patient transported to X-ray 

## 2014-11-23 NOTE — ED Provider Notes (Signed)
CSN: [id number]     Arrival date & time 11/23/14  0500 History   First MD Initiated Contact with Patient 11/23/14 [phone]     Chief Complaint  Patient presents with  . Abdominal Pain     (Consider location/radiation/quality/duration/timing/severity/associated sxs/prior Treatment) Patient is a 69 y.o. male presenting with abdominal pain. The history is provided by the patient. No language interpreter was used.  Abdominal Pain Pain location:  LLQ Pain radiates to:  Back Pain severity:  Moderate Progression:  Waxing and waning Associated symptoms: nausea   Associated symptoms: no diarrhea, no dysuria, no fever and no vomiting   Associated symptoms comment:  Pain in the LLQ abdomen extending across lower back that woke him from sleep tonight. He had some transient nausea without vomiting. No fever. Last bowel movement was yesterday. He states he feels he needs to move his bowels now but can't. No fever. He denies urinary symptoms or groin pain.   Past Medical History  Diagnosis Date  . DYSPNEA 02/09/2009  . RESTLESS LEGS SYNDROME 02/28/2009  . SEIZURE DISORDER 02/09/2009    last seisure Sep 10, 2001  . Colon polyps     Sessile Serrated adenomas   Past Surgical History  Procedure Laterality Date  . Laparoscopic appendectomy  06/01/2011    Procedure: APPENDECTOMY LAPAROSCOPIC;  Surgeon: Shann Medal, MD;  Location: [facility];  Service: General;  Laterality: N/A;  . Hernia repair Right 02/11/09    dr. Donella Stade [facility]  . Appendectomy  06/01/11   Family History  Problem Relation Age of Onset  . Cancer Mother   . Cancer Father     multiple myeloma  . Stroke Neg Hx     grandparent  . Colon cancer Neg Hx   . Esophageal cancer Neg Hx   . Rectal cancer Neg Hx   . Stomach cancer Neg Hx   . Diabetes Mellitus II Father    History  Substance Use Topics  . Smoking status: Never Smoker   . Smokeless tobacco: Never Used  . Alcohol Use: No    Review of Systems  Constitutional: Negative.   Negative for fever.  Respiratory: Negative.   Cardiovascular: Negative.   Gastrointestinal: Positive for nausea and abdominal pain. Negative for vomiting, diarrhea and blood in stool.  Genitourinary: Negative.  Negative for dysuria.  Musculoskeletal: Negative.  Negative for myalgias.  Neurological: Negative.       Allergies  Carbamazepine; Ciprofloxacin; Metronidazole; Penicillins; Phenytoin; Sulfamethoxazole; Trimethoprim; Bactrim; and Phenytoin sodium extended  Home Medications   Prior to Admission medications   Medication Sig Start Date End Date Taking? Authorizing Provider  Calcium-Magnesium-Zinc [phone] MG TABS Take 3 tablets by mouth daily.   Yes Historical Provider, MD  [facility] Liver Oil OIL Take 5 mLs by mouth daily.     Yes Historical Provider, MD  Coenzyme Q10 (CO Q 10) 60 MG CAPS Take 1 tablet by mouth daily.     Yes Historical Provider, MD  cyanocobalamin 500 MCG tablet Take 1,000 mcg by mouth daily.   Yes Historical Provider, MD  Homeopathic Products [facility] DRY EYE RELIEF) SOLN Apply 1 drop to eye daily as needed. Dry Eyes    Yes Historical Provider, MD  PHENObarbital (LUMINAL) 60 MG tablet Take 1 tablet (60 mg total) by mouth at bedtime. 08/19/14 08/19/15 Yes [name], MD  Probiotic Product (PROBIOTIC DAILY PO) Take by mouth daily.   Yes Historical Provider, MD  vitamin C (ASCORBIC ACID) 500 MG tablet Take 500 mg by  mouth 3 (three) times daily.     Yes Historical Provider, MD  cholecalciferol (VITAMIN D) 1000 UNITS tablet Take 1,000 Units by mouth every Monday, Wednesday, and Friday.     Historical Provider, MD  hyoscyamine (LEVSIN SL) 0.125 MG SL tablet Place 1 tablet (0.125 mg total) under the tongue every 4 (four) hours as needed. 09/24/13   Jerene Bears, MD   BP 139/63 mmHg  Pulse 63  Temp(Src) 98.1 F (36.7 C) (Oral)  Resp 20  Ht 5' 11" (1.803 m)  Wt 154 lb (69.854 kg)  BMI 21.49 kg/m2  SpO2 97% Physical Exam  Constitutional: He appears well-developed  and well-nourished.  HENT:  Head: Normocephalic.  Neck: Normal range of motion. Neck supple.  Cardiovascular: Normal rate and regular rhythm.   Pulmonary/Chest: Effort normal and breath sounds normal.  Abdominal: Soft. He exhibits no distension and no mass. Bowel sounds are increased. There is tenderness in the left lower quadrant. There is no rebound and no guarding.  Musculoskeletal: Normal range of motion.  Neurological: He is alert. No cranial nerve deficit.  Skin: Skin is warm and dry. No rash noted.  Psychiatric: He has a normal mood and affect.    ED Course  Procedures (including critical care time) Labs Review Labs Reviewed  CBC WITH DIFFERENTIAL/PLATELET  COMPREHENSIVE METABOLIC PANEL  URINALYSIS, ROUTINE W REFLEX MICROSCOPIC    Imaging Review Dg Abd Acute W/chest  11/23/2014   CLINICAL DATA:  Mid to left lower abdominal pain and nausea and vomiting since 0200 hours this morning.  EXAM: DG ABDOMEN ACUTE W/ 1V CHEST  COMPARISON:  CT abdomen and pelvis 06/17/2011. Abdomen 06/08/2011. Chest 06/02/2011.  FINDINGS: Normal heart size and pulmonary vascularity. Emphysematous changes in the lungs. No focal airspace disease or consolidation. No blunting of costophrenic angles. No pneumothorax. Mediastinal contours appear intact.  Stool-filled colon. No small or large bowel distention. No free intra-abdominal air. No abnormal air-fluid levels. No radiopaque stones. Degenerative changes in the spine.  IMPRESSION: No evidence of active pulmonary disease. Nonobstructive bowel gas pattern with diffusely stool-filled colon.   Electronically Signed   By: Lucienne Capers M.D.   On: 11/23/2014 05:59     EKG Interpretation None      MDM   Final diagnoses:  None    1. Abdominal pain  The patient states he wants to avoid a CT if possible. He has concerns about having had multiple scans with a ruptured appendix several years ago and does not want the radiation exposure. Plain film abdomen  shows no abnormalities to explain patient's pain. CT is felt necessary for full evaluation.  Patient care transferred to Delos Haring, PA-C, pending CT results.     Charlann Lange, PA-C 11/23/14 [id number]  Linton Flemings, MD 11/23/14 [phone]

## 2014-11-23 NOTE — ED Notes (Signed)
Pt states that he began having left lower quad abd pain and left flank pain that woke him from sleep around 2:30am; pt states that he feels hot, flushed and shakey; pt states that he feels nauseous but no vomiting

## 2014-11-29 ENCOUNTER — Encounter: Payer: Self-pay | Admitting: Family Medicine

## 2014-11-29 ENCOUNTER — Ambulatory Visit (INDEPENDENT_AMBULATORY_CARE_PROVIDER_SITE_OTHER): Payer: Medicare Other | Admitting: Family Medicine

## 2014-11-29 VITALS — BP 98/50 | HR 82 | Temp 98.1°F | Ht 71.0 in | Wt 152.0 lb

## 2014-11-29 DIAGNOSIS — K5909 Other constipation: Secondary | ICD-10-CM

## 2014-11-29 DIAGNOSIS — G40909 Epilepsy, unspecified, not intractable, without status epilepticus: Secondary | ICD-10-CM

## 2014-11-29 NOTE — Progress Notes (Signed)
Pre visit review using our clinic review tool, if applicable. No additional management support is needed unless otherwise documented below in the visit note. 

## 2014-11-29 NOTE — Patient Instructions (Signed)

## 2014-11-29 NOTE — Progress Notes (Signed)
Subjective:    Patient ID: Nathan [name], male    DOB: 22-Sep-1945, 69 y.o.   MRN: [id number]  HPI Patient here for the following issues  DMV form completion. Patient had grand mal seizure back in 1994. He recalls having EEG at that time. His most recent seizure was 09/10/2001. Basically, he's had seizures in his sleep and never while awake. Because of this, he does not have any clear aura.  He has been on phenobarbital no dosages 60 mg daily for several years without any recent problems. Last change in medication was 2008. Apparently that point his physician increased his dosage from 30-60 more or less to help with sleep. He has not had any recent levels.  Recent abdominal pain. Went to emergency department. Had extensive workup. Labs unrevealing. Plain films revealed nonobstruction though with diffusely stool-filled colon. CT abdomen and pelvis no acute abnormalities. Patient was given pain medication and symptoms resolved. He has frequent constipation. He exercises fairly regularly and drinks lots of fluids. He tends to eat a lot of fruits and vegetables. He's tried things like prunes without improvement. Has not tried any recent stool softener. Previous intolerance with MiraLAX. Had colonoscopy just last year. No recent bloody stools. Recent TSH normal  Past Medical History  Diagnosis Date  . DYSPNEA 02/09/2009  . RESTLESS LEGS SYNDROME 02/28/2009  . SEIZURE DISORDER 02/09/2009    last seisure Sep 10, 2001  . Colon polyps     Sessile Serrated adenomas   Past Surgical History  Procedure Laterality Date  . Laparoscopic appendectomy  06/01/2011    Procedure: APPENDECTOMY LAPAROSCOPIC;  Surgeon: Shann Medal, MD;  Location: [facility];  Service: General;  Laterality: N/A;  . Hernia repair Right 02/11/09    dr. Donella Stade [facility]  . Appendectomy  06/01/11    reports that he has never smoked. He has never used smokeless tobacco. He reports that he does not drink alcohol or use illicit drugs. family  history includes Cancer in his father and mother; Diabetes Mellitus II in his father. There is no history of Stroke, Colon cancer, Esophageal cancer, Rectal cancer, or Stomach cancer. Allergies  Allergen Reactions  . Carbamazepine     REACTION: fatigue, sleepy  . Ciprofloxacin     Per pt, "thinks caused rash"  . Metronidazole     REACTION: GI upset  . Penicillins     REACTION: childhood, hives?  . Phenytoin     REACTION: rash, leg swelling  . Sulfamethoxazole Other (See Comments)  . Trimethoprim Other (See Comments)  . Bactrim Other (See Comments)    Caused headaches, that progressively worsened when on medication. Once off, they went away.  . Phenytoin Sodium Extended Swelling and Rash    Swelling of ankles. Happened years ago per patient.      Review of Systems  Constitutional: Negative for fever.  Respiratory: Negative for cough and shortness of breath.   Cardiovascular: Negative for chest pain.  Gastrointestinal: Positive for constipation. Negative for nausea, vomiting and diarrhea.  Genitourinary: Negative for dysuria.  Neurological: Negative for dizziness, seizures, syncope, weakness and headaches.  Psychiatric/Behavioral: Negative for confusion.       Objective:   Physical Exam  Constitutional: He is oriented to person, place, and time. He appears well-developed and well-nourished. No distress.  Neck: Neck supple. No thyromegaly present.  Cardiovascular: Normal rate and regular rhythm.   Pulmonary/Chest: Effort normal and breath sounds normal. No respiratory distress. He has no wheezes. He has no rales.  Abdominal: Soft. Bowel sounds are normal. He exhibits no distension and no mass. There is no tenderness. There is no rebound and no guarding.  Musculoskeletal: He exhibits no edema.  Neurological: He is alert and oriented to person, place, and time. No cranial nerve deficit. Coordination normal.  Psychiatric: He has a normal mood and affect. His behavior is normal.            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 We have written order for her phenobarbital level with his upcoming labs. Paperwork completed for DMV. We do not see any contraindications for driving at this time #2 recent constipation. Measures discussed to reduce. Goal fiber intake 25-30 g per day. Increase walking and activities. Consider regular use of stool softener. Avoid regular use of stimulant laxatives

## 2015-01-04 ENCOUNTER — Other Ambulatory Visit (INDEPENDENT_AMBULATORY_CARE_PROVIDER_SITE_OTHER): Payer: Medicare Other

## 2015-01-04 DIAGNOSIS — Z0000 Encounter for general adult medical examination without abnormal findings: Secondary | ICD-10-CM

## 2015-01-04 DIAGNOSIS — G40909 Epilepsy, unspecified, not intractable, without status epilepticus: Secondary | ICD-10-CM

## 2015-01-04 DIAGNOSIS — Z79899 Other long term (current) drug therapy: Secondary | ICD-10-CM

## 2015-01-04 LAB — LIPID PANEL
CHOLESTEROL: 132 mg/dL (ref 0–200)
HDL: 69.3 mg/dL (ref >39.00)
LDL Cholesterol: 57 mg/dL (ref 0–99)
NonHDL: 62.7
TRIGLYCERIDES: 28 mg/dL (ref 0.0–149.0)
Total CHOL/HDL Ratio: 2
VLDL: 5.6 mg/dL (ref 0.0–40.0)

## 2015-01-04 LAB — HEPATIC FUNCTION PANEL
ALT: 17 U/L (ref 0–53)
AST: 21 U/L (ref 0–37)
Albumin: 3.9 g/dL (ref 3.5–5.2)
Alkaline Phosphatase: 51 U/L (ref 39–117)
BILIRUBIN TOTAL: 0.7 mg/dL (ref 0.2–1.2)
Bilirubin, Direct: 0.2 mg/dL (ref 0.0–0.3)
Total Protein: 5.6 g/dL — ABNORMAL LOW (ref 6.0–8.3)

## 2015-01-04 LAB — CBC WITH DIFFERENTIAL/PLATELET
BASOS PCT: 0.8 % (ref 0.0–3.0)
Basophils Absolute: 0 10*3/uL (ref 0.0–0.1)
EOS PCT: 2.5 % (ref 0.0–5.0)
Eosinophils Absolute: 0.1 10*3/uL (ref 0.0–0.7)
HEMATOCRIT: 41.4 % (ref 39.0–52.0)
Hemoglobin: 14 g/dL (ref 13.0–17.0)
LYMPHS PCT: 39.7 % (ref 12.0–46.0)
Lymphs Abs: 1.2 10*3/uL (ref 0.7–4.0)
MCHC: 33.8 g/dL (ref 30.0–36.0)
MCV: 94.8 fl (ref 78.0–100.0)
Monocytes Absolute: 0.5 10*3/uL (ref 0.1–1.0)
Monocytes Relative: 17.2 % — ABNORMAL HIGH (ref 3.0–12.0)
NEUTROS ABS: 1.2 10*3/uL — AB (ref 1.4–7.7)
Neutrophils Relative %: 39.8 % — ABNORMAL LOW (ref 43.0–77.0)
PLATELETS: 159 10*3/uL (ref 150.0–400.0)
RBC: 4.37 Mil/uL (ref 4.22–5.81)
RDW: 13.5 % (ref 11.5–15.5)
WBC: 3.1 10*3/uL — ABNORMAL LOW (ref 4.0–10.5)

## 2015-01-04 LAB — PSA: PSA: 0.48 ng/mL (ref 0.10–4.00)

## 2015-01-04 LAB — BASIC METABOLIC PANEL
BUN: 12 mg/dL (ref 6–23)
CO2: 33 mEq/L — ABNORMAL HIGH (ref 19–32)
CREATININE: 0.7 mg/dL (ref 0.40–1.50)
Calcium: 8.9 mg/dL (ref 8.4–10.5)
Chloride: 103 mEq/L (ref 96–112)
GFR: 119.23 mL/min (ref >60.00)
GLUCOSE: 91 mg/dL (ref 70–99)
Potassium: 4.2 mEq/L (ref 3.5–5.1)
SODIUM: 140 meq/L (ref 135–145)

## 2015-01-04 LAB — TSH: TSH: 1.13 u[IU]/mL (ref 0.35–4.50)

## 2015-01-05 LAB — PHENOBARBITAL LEVEL: Phenobarbital: 9.3 mg/L — ABNORMAL LOW (ref 15.0–40.0)

## 2015-01-11 ENCOUNTER — Encounter: Payer: Medicare Other | Admitting: Family Medicine

## 2015-01-18 ENCOUNTER — Ambulatory Visit (INDEPENDENT_AMBULATORY_CARE_PROVIDER_SITE_OTHER): Payer: Medicare Other | Admitting: Family Medicine

## 2015-01-18 ENCOUNTER — Encounter: Payer: Self-pay | Admitting: Family Medicine

## 2015-01-18 VITALS — BP 120/78 | HR 60 | Temp 98.4°F | Ht 71.0 in | Wt 147.0 lb

## 2015-01-18 DIAGNOSIS — Z0000 Encounter for general adult medical examination without abnormal findings: Secondary | ICD-10-CM

## 2015-01-18 DIAGNOSIS — Z23 Encounter for immunization: Secondary | ICD-10-CM

## 2015-01-18 NOTE — Progress Notes (Signed)
Subjective:    Patient ID: Nathan [name], male    DOB: 07-16-1946, 69 y.o.   MRN: [id number]  HPI Patient here for complete physical. He has history of seizures years ago but has not had a seizure now in many years on low-dose phenobarbital. He has history of restless leg syndrome. Takes several over-the-counter supplements. Stays fairly active with exercise. Immunizations are up-to-date with the exception of no history of Prevnar 13. Never smoked.  Past Medical History  Diagnosis Date  . DYSPNEA 02/09/2009  . RESTLESS LEGS SYNDROME 02/28/2009  . SEIZURE DISORDER 02/09/2009    last seisure Sep 10, 2001  . Colon polyps     Sessile Serrated adenomas   Past Surgical History  Procedure Laterality Date  . Laparoscopic appendectomy  06/01/2011    Procedure: APPENDECTOMY LAPAROSCOPIC;  Surgeon: Shann Medal, MD;  Location: [facility];  Service: General;  Laterality: N/A;  . Hernia repair Right 02/11/09    dr. Donella Stade [facility]  . Appendectomy  06/01/11    reports that he has never smoked. He has never used smokeless tobacco. He reports that he does not drink alcohol or use illicit drugs. family history includes Cancer in his father and mother; Diabetes Mellitus II in his father. There is no history of Stroke, Colon cancer, Esophageal cancer, Rectal cancer, or Stomach cancer. Allergies  Allergen Reactions  . Carbamazepine     REACTION: fatigue, sleepy  . Ciprofloxacin     Per pt, "thinks caused rash"  . Metronidazole     REACTION: GI upset  . Penicillins     REACTION: childhood, hives?  . Phenytoin     REACTION: rash, leg swelling  . Sulfamethoxazole Other (See Comments)  . Trimethoprim Other (See Comments)  . Bactrim Other (See Comments)    Caused headaches, that progressively worsened when on medication. Once off, they went away.  . Phenytoin Sodium Extended Swelling and Rash    Swelling of ankles. Happened years ago per patient.      Review of Systems  Constitutional: Negative  for fever, activity change, appetite change and fatigue.  HENT: Negative for congestion, ear pain and trouble swallowing.   Eyes: Negative for pain and visual disturbance.  Respiratory: Negative for cough, shortness of breath and wheezing.   Cardiovascular: Negative for chest pain and palpitations.  Gastrointestinal: Negative for nausea, vomiting, abdominal pain, diarrhea, constipation, blood in stool, abdominal distention and rectal pain.  Genitourinary: Negative for dysuria, hematuria and testicular pain.  Musculoskeletal: Negative for joint swelling and arthralgias.  Skin: Negative for rash.  Neurological: Negative for dizziness, syncope and headaches.  Hematological: Negative for adenopathy.  Psychiatric/Behavioral: Negative for confusion and dysphoric mood.       Objective:   Physical Exam  Constitutional: He is oriented to person, place, and time. He appears well-developed and well-nourished. No distress.  HENT:  Head: Normocephalic and atraumatic.  Right Ear: External ear normal.  Left Ear: External ear normal.  Mouth/Throat: Oropharynx is clear and moist.  Eyes: Conjunctivae and EOM are normal. Pupils are equal, round, and reactive to light.  Neck: Normal range of motion. Neck supple. No thyromegaly present.  Cardiovascular: Normal rate, regular rhythm and normal heart sounds.   No murmur heard. Pulmonary/Chest: No respiratory distress. He has no wheezes. He has no rales.  Abdominal: Soft. Bowel sounds are normal. He exhibits no distension and no mass. There is no tenderness. There is no rebound and no guarding.  Genitourinary: Rectum normal and prostate normal.  Musculoskeletal: He exhibits no edema.  Lymphadenopathy:    He has no cervical adenopathy.  Neurological: He is alert and oriented to person, place, and time. He displays normal reflexes. No cranial nerve deficit.  Skin: No rash noted.  Psychiatric: He has a normal mood and affect.          Assessment &  Plan:  Complete physical. Prevnar 13 given. Labs discussed. He has slightly low phenobarbital level but he is reluctant to take any additional medication and has not had a seizure in many years now. Patient requesting hemoccult cards even though he had colonoscopy last year. These were given. Continue yearly flu vaccine

## 2015-01-18 NOTE — Patient Instructions (Signed)
Continue with yearly flu vaccine Prevnar 13 vaccine given today Your other immunizations are up-to-date.

## 2015-01-18 NOTE — Progress Notes (Signed)
Pre visit review using our clinic review tool, if applicable. No additional management support is needed unless otherwise documented below in the visit note. 

## 2015-01-27 ENCOUNTER — Other Ambulatory Visit (INDEPENDENT_AMBULATORY_CARE_PROVIDER_SITE_OTHER): Payer: Medicare Other

## 2015-01-27 DIAGNOSIS — Z0000 Encounter for general adult medical examination without abnormal findings: Secondary | ICD-10-CM

## 2015-01-27 LAB — HEMOCCULT GUIAC POC 1CARD (OFFICE)
Card #2 Fecal Occult Blod, POC: NEGATIVE
Card #3 Fecal Occult Blood, POC: NEGATIVE
FECAL OCCULT BLD: NEGATIVE

## 2015-03-13 ENCOUNTER — Encounter: Payer: Self-pay | Admitting: Internal Medicine

## 2015-04-08 ENCOUNTER — Other Ambulatory Visit: Payer: Self-pay | Admitting: *Deleted

## 2015-04-08 MED ORDER — PHENOBARBITAL 60 MG PO TABS: 60.0000 mg | ORAL_TABLET | Freq: Every day | ORAL | Status: DC

## 2015-04-08 NOTE — Telephone Encounter (Signed)
Rx refill for Phenobarbital faxed to [facility].

## 2015-05-04 ENCOUNTER — Telehealth: Payer: Self-pay | Admitting: Family Medicine

## 2015-05-04 NOTE — Telephone Encounter (Signed)
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 Can I schedule?

## 2015-05-04 NOTE — Telephone Encounter (Signed)
Yes that's ok to me

## 2015-05-05 NOTE — Telephone Encounter (Signed)
Left message on home phone for pt to call back. 

## 2015-05-06 NOTE — Telephone Encounter (Signed)
Pt has been sch for 10/27 4 pm ok per carmen

## 2015-05-12 ENCOUNTER — Ambulatory Visit (INDEPENDENT_AMBULATORY_CARE_PROVIDER_SITE_OTHER): Payer: Medicare Other

## 2015-05-12 DIAGNOSIS — Z23 Encounter for immunization: Secondary | ICD-10-CM

## 2015-05-12 NOTE — Telephone Encounter (Signed)
Error

## 2015-08-31 ENCOUNTER — Ambulatory Visit (INDEPENDENT_AMBULATORY_CARE_PROVIDER_SITE_OTHER): Payer: Medicare Other | Admitting: Family Medicine

## 2015-08-31 ENCOUNTER — Encounter: Payer: Self-pay | Admitting: Family Medicine

## 2015-08-31 VITALS — BP 110/80 | HR 101 | Temp 98.2°F | Ht 71.0 in | Wt 152.4 lb

## 2015-08-31 DIAGNOSIS — R1084 Generalized abdominal pain: Secondary | ICD-10-CM

## 2015-08-31 DIAGNOSIS — R109 Unspecified abdominal pain: Secondary | ICD-10-CM | POA: Diagnosis not present

## 2015-08-31 MED ORDER — HYOSCYAMINE SULFATE 0.125 MG SL SUBL: 0.1250 mg | SUBLINGUAL_TABLET | SUBLINGUAL | Status: DC | PRN

## 2015-08-31 NOTE — Progress Notes (Signed)
Subjective:    Patient ID: Nathan [name], male    DOB: [birth year], 70 y.o.   MRN: [id number]  HPI Patient is seen today with chief complaint of "bowel issues " He states that on February 3 he developed some mild diarrhea about one day duration. No bloody stools. He has not had any diarrhea since then but has had smaller caliber stools He has noted that several stools are small ball shaped and slightly lighter color than usual. He has occasional bloated feeling. He has a chronic history of some mild nausea and diffuse abdominal cramping pains frequently after eating. He states he's had these types of symptoms always back to childhood. Appetite and weight are stable. Weight is actually up somewhat compared to last visit. No fevers or chills. No dysuria. Colonoscopy May 2015 benign polyps. No history of diverticulosis. No known food allergies. No stool incontinence. Abdominal cramping seems to be triggered by food-but no particular foods Was previously prescribed Levsin by GI but never started on this He feels he is getting adequate fiber in his diet  Past Medical History  Diagnosis Date  . DYSPNEA 02/09/2009  . RESTLESS LEGS SYNDROME 02/28/2009  . SEIZURE DISORDER 02/09/2009    last seisure Sep 10, 2001  . Colon polyps     Sessile Serrated adenomas   Past Surgical History  Procedure Laterality Date  . Laparoscopic appendectomy  06/01/2011    Procedure: APPENDECTOMY LAPAROSCOPIC;  Surgeon: Shann Medal, MD;  Location: [facility];  Service: General;  Laterality: N/A;  . Hernia repair Right 02/11/09    dr. Donella Stade [facility]  . Appendectomy  06/01/11    reports that he has never smoked. He has never used smokeless tobacco. He reports that he does not drink alcohol or use illicit drugs. family history includes Cancer in his father and mother; Diabetes Mellitus II in his father. There is no history of Stroke, Colon cancer, Esophageal cancer, Rectal cancer, or Stomach cancer. Allergies    Allergen Reactions  . Carbamazepine     REACTION: fatigue, sleepy  . Ciprofloxacin     Per pt, "thinks caused rash"  . Metronidazole     REACTION: GI upset  . Penicillins     REACTION: childhood, hives?  . Phenytoin     REACTION: rash, leg swelling  . Sulfamethoxazole Other (See Comments)  . Trimethoprim Other (See Comments)  . Bactrim Other (See Comments)    Caused headaches, that progressively worsened when on medication. Once off, they went away.  . Phenytoin Sodium Extended Swelling and Rash    Swelling of ankles. Happened years ago per patient.      Review of Systems  Constitutional: Negative for fever, chills, appetite change and unexpected weight change.  Respiratory: Negative for shortness of breath.   Cardiovascular: Negative for chest pain.  Gastrointestinal: Positive for abdominal pain. Negative for vomiting, diarrhea and blood in stool.  Genitourinary: Negative for dysuria.  Neurological: Negative for dizziness.       Objective:   Physical Exam  Constitutional: He appears well-developed and well-nourished.  Neck: Neck supple.  Cardiovascular: Normal rate and regular rhythm.   Pulmonary/Chest: Effort normal and breath sounds normal. No respiratory distress. He has no wheezes. He has no rales.  Abdominal: Soft. Bowel sounds are normal. He exhibits no distension and no mass. There is no tenderness. There is no rebound and no guarding.  Genitourinary:  No rectal mass. He has some relatively non-formed brownish stool in the rectal vault. Hemoccult negative.  Musculoskeletal: He exhibits no edema.          Assessment & Plan:  Diffuse abdominal pain and cramping-question functional abdominal pain. His symptoms have been chronic for years. Recent colonoscopy as above. He does not have any worrisome symptoms such as appetite or weight change. Recommend fiber at least 30 g daily with handout given. Levsin 0.125 mg sublingual every 4 hours as needed for abdominal  cramp-like pain. Follow-up with GI if symptoms persist

## 2015-08-31 NOTE — Patient Instructions (Signed)
High-Fiber Diet  Fiber, also called dietary fiber, is a type of carbohydrate found in fruits, vegetables, whole grains, and beans. A high-fiber diet can have many health benefits. Your health care provider may recommend a high-fiber diet to help:  · Prevent constipation. Fiber can make your bowel movements more regular.  · Lower your cholesterol.  · Relieve hemorrhoids, uncomplicated diverticulosis, or irritable bowel syndrome.  · Prevent overeating as part of a weight-loss plan.  · Prevent heart disease, type 2 diabetes, and certain cancers.  WHAT IS MY PLAN?  The recommended daily intake of fiber includes:  · 38 grams for men under age 50.  · 30 grams for men over age 50.  · 25 grams for women under age 50.  · 21 grams for women over age 50.  You can get the recommended daily intake of dietary fiber by eating a variety of fruits, vegetables, grains, and beans. Your health care provider may also recommend a fiber supplement if it is not possible to get enough fiber through your diet.  WHAT DO I NEED TO KNOW ABOUT A HIGH-FIBER DIET?  · Fiber supplements have not been widely studied for their effectiveness, so it is better to get fiber through food sources.  · Always check the fiber content on the nutrition facts label of any prepackaged food. Look for foods that contain at least 5 grams of fiber per serving.  · Ask your dietitian if you have questions about specific foods that are related to your condition, especially if those foods are not listed in the following section.  · Increase your daily fiber consumption gradually. Increasing your intake of dietary fiber too quickly may cause bloating, cramping, or gas.  · Drink plenty of water. Water helps you to digest fiber.  WHAT FOODS CAN I EAT?  Grains  Whole-grain breads. Multigrain cereal. Oats and oatmeal. Brown rice. Barley. Bulgur wheat. Millet. Bran muffins. Popcorn. Rye wafer crackers.  Vegetables  Sweet potatoes. Spinach. Kale. Artichokes. Cabbage. Broccoli.  Green peas. Carrots. Squash.  Fruits  Berries. Pears. Apples. Oranges. Avocados. Prunes and raisins. Dried figs.  Meats and Other Protein Sources  Navy, kidney, pinto, and soy beans. Split peas. Lentils. Nuts and seeds.  Dairy  Fiber-fortified yogurt.  Beverages  Fiber-fortified soy milk. Fiber-fortified orange juice.  Other  Fiber bars.  The items listed above may not be a complete list of recommended foods or beverages. Contact your dietitian for more options.  WHAT FOODS ARE NOT RECOMMENDED?  Grains  White bread. Pasta made with refined flour. White rice.  Vegetables  Fried potatoes. Canned vegetables. Well-cooked vegetables.   Fruits  Fruit juice. Cooked, strained fruit.  Meats and Other Protein Sources  Fatty cuts of meat. Fried poultry or fried fish.  Dairy  Milk. Yogurt. Cream cheese. Sour cream.  Beverages  Soft drinks.  Other  Cakes and pastries. Butter and oils.  The items listed above may not be a complete list of foods and beverages to avoid. Contact your dietitian for more information.  WHAT ARE SOME TIPS FOR INCLUDING HIGH-FIBER FOODS IN MY DIET?  · Eat a wide variety of high-fiber foods.  · Make sure that half of all grains consumed each day are whole grains.  · Replace breads and cereals made from refined flour or white flour with whole-grain breads and cereals.  · Replace white rice with brown rice, bulgur wheat, or millet.  · Start the day with a breakfast that is high in fiber, such as a   cereal that contains at least 5 grams of fiber per serving.  · Use beans in place of meat in soups, salads, or pasta.  · Eat high-fiber snacks, such as berries, raw vegetables, nuts, or popcorn.     This information is not intended to replace advice given to you by your health care provider. Make sure you discuss any questions you have with your health care provider.     Document Released: 07/02/2005 Document Revised: 07/23/2014 Document Reviewed: 12/15/2013  Elsevier Interactive Patient Education ©2016 [employer]

## 2015-08-31 NOTE — Progress Notes (Signed)
Pre visit review using our clinic review tool, if applicable. No additional management support is needed unless otherwise documented below in the visit note. 

## 2015-09-08 ENCOUNTER — Ambulatory Visit: Payer: Medicare Other | Admitting: Neurology

## 2015-09-15 ENCOUNTER — Telehealth: Payer: Self-pay | Admitting: *Deleted

## 2015-09-15 MED ORDER — PHENOBARBITAL 60 MG PO TABS: 60.0000 mg | ORAL_TABLET | Freq: Every day | ORAL | Status: DC

## 2015-09-15 NOTE — Telephone Encounter (Signed)
Printed and faxed to pharmacy.  

## 2015-09-15 NOTE — Telephone Encounter (Addendum)
Last filled 04/08/2015 #90, 1 rf Last seen 08/31/2015 Please advise.

## 2015-09-15 NOTE — Telephone Encounter (Signed)
[facility] refill request PHENObarbital (LUMINAL) 60 MG tablet Fax [phone]

## 2015-09-15 NOTE — Telephone Encounter (Signed)
Refill for 6 months. 

## 2015-09-21 ENCOUNTER — Telehealth: Payer: Self-pay | Admitting: Family Medicine

## 2015-09-21 MED ORDER — PHENOBARBITAL 60 MG PO TABS: 60.0000 mg | ORAL_TABLET | Freq: Every day | ORAL | Status: DC

## 2015-09-21 NOTE — Telephone Encounter (Signed)
Pt needs new rx phenobarbital 60 mg #90 send to new pharm [facility]

## 2015-09-21 NOTE — Telephone Encounter (Signed)
Medication faxed for patient.

## 2015-09-21 NOTE — Telephone Encounter (Signed)
Printed for signature

## 2015-11-22 ENCOUNTER — Ambulatory Visit (INDEPENDENT_AMBULATORY_CARE_PROVIDER_SITE_OTHER): Payer: Medicare Other | Admitting: Family Medicine

## 2015-11-22 VITALS — BP 100/70 | HR 90 | Temp 98.0°F | Ht 71.0 in | Wt 152.2 lb

## 2015-11-22 DIAGNOSIS — W57XXXA Bitten or stung by nonvenomous insect and other nonvenomous arthropods, initial encounter: Secondary | ICD-10-CM | POA: Diagnosis not present

## 2015-11-22 DIAGNOSIS — T148 Other injury of unspecified body region: Secondary | ICD-10-CM | POA: Diagnosis not present

## 2015-11-22 NOTE — Progress Notes (Signed)
Pre visit review using our clinic review tool, if applicable. No additional management support is needed unless otherwise documented below in the visit note. 

## 2015-11-22 NOTE — Patient Instructions (Signed)
Tick Bite Information Ticks are insects that attach themselves to the skin and draw blood for food. There are various types of ticks. Common types include wood ticks and deer ticks. Most ticks live in shrubs and grassy areas. Ticks can climb onto your body when you make contact with leaves or grass where the tick is waiting. The most common places on the body for ticks to attach themselves are the scalp, neck, armpits, waist, and groin. Most tick bites are harmless, but sometimes ticks carry germs that cause diseases. These germs can be spread to a person during the tick's feeding process. The chance of a disease spreading through a tick bite depends on:   The type of tick.  Time of year.   How long the tick is attached.   Geographic location.  HOW CAN YOU PREVENT TICK BITES? Take these steps to help prevent tick bites when you are outdoors:  Wear protective clothing. Long sleeves and long pants are best.   Wear white clothes so you can see ticks more easily.  Tuck your pant legs into your socks.   If walking on a trail, stay in the middle of the trail to avoid brushing against bushes.  Avoid walking through areas with long grass.  Put insect repellent on all exposed skin and along boot tops, pant legs, and sleeve cuffs.   Check clothing, hair, and skin repeatedly and before going inside.   Brush off any ticks that are not attached.  Take a shower or bath as soon as possible after being outdoors.  WHAT IS THE PROPER WAY TO REMOVE A TICK? Ticks should be removed as soon as possible to help prevent diseases caused by tick bites. 1. If latex gloves are available, put them on before trying to remove a tick.  2. Using fine-point tweezers, grasp the tick as close to the skin as possible. You may also use curved forceps or a tick removal tool. Grasp the tick as close to its head as possible. Avoid grasping the tick on its body. 3. Pull gently with steady upward pressure until  the tick lets go. Do not twist the tick or jerk it suddenly. This may break off the tick's head or mouth parts. 4. Do not squeeze or crush the tick's body. This could force disease-carrying fluids from the tick into your body.  5. After the tick is removed, wash the bite area and your hands with soap and water or other disinfectant such as alcohol. 6. Apply a small amount of antiseptic cream or ointment to the bite site.  7. Wash and disinfect any instruments that were used.  Do not try to remove a tick by applying a hot match, petroleum jelly, or fingernail polish to the tick. These methods do not work and may increase the chances of disease being spread from the tick bite.  WHEN SHOULD YOU SEEK MEDICAL CARE? Contact your health care provider if you are unable to remove a tick from your skin or if a part of the tick breaks off and is stuck in the skin.  After a tick bite, you need to be aware of signs and symptoms that could be related to diseases spread by ticks. Contact your health care provider if you develop any of the following in the days or weeks after the tick bite:  Unexplained fever.  Rash. A circular rash that appears days or weeks after the tick bite may indicate the possibility of Lyme disease. The rash may resemble   a target with a bull's-eye and may occur at a different part of your body than the tick bite.  Redness and swelling in the area of the tick bite.   Tender, swollen lymph glands.   Diarrhea.   Weight loss.   Cough.   Fatigue.   Muscle, joint, or bone pain.   Abdominal pain.   Headache.   Lethargy or a change in your level of consciousness.  Difficulty walking or moving your legs.   Numbness in the legs.   Paralysis.  Shortness of breath.   Confusion.   Repeated vomiting.    This information is not intended to replace advice given to you by your health care provider. Make sure you discuss any questions you have with your health  care provider.   Document Released: 06/29/2000 Document Revised: 07/23/2014 Document Reviewed: 12/10/2012 Elsevier Interactive Patient Education 2016 Elsevier Inc.  

## 2015-11-22 NOTE — Progress Notes (Signed)
   Subjective:    Patient ID: Nathan [name], male    DOB: 1946-01-22, 70 y.o.   MRN: [id number]  HPI  Patient seen with tick bite right lateral leg which he pulled off last night.  This was bitten in. He was able to remove this fully.  He has a small bruise at the site where this was bitten then. No other rash.  Denies any headache, fever, chills, arthralgias.  He states this is the first tick he has pulled off and about 20 years. Uses Pennyroyal oil topically for prevention.  Past Medical History  Diagnosis Date  . DYSPNEA 02/09/2009  . RESTLESS LEGS SYNDROME 02/28/2009  . SEIZURE DISORDER 02/09/2009    last seisure Sep 10, 2001  . Colon polyps     Sessile Serrated adenomas   Past Surgical History  Procedure Laterality Date  . Laparoscopic appendectomy  06/01/2011    Procedure: APPENDECTOMY LAPAROSCOPIC;  Surgeon: Shann Medal, MD;  Location: [facility];  Service: General;  Laterality: N/A;  . Hernia repair Right 02/11/09    dr. Donella Stade -[facility]  . Appendectomy  06/01/11    reports that he has never smoked. He has never used smokeless tobacco. He reports that he does not drink alcohol or use illicit drugs. family history includes Cancer in his father and mother; Diabetes Mellitus II in his father. There is no history of Stroke, Colon cancer, Esophageal cancer, Rectal cancer, or Stomach cancer. Allergies  Allergen Reactions  . Carbamazepine     REACTION: fatigue, sleepy  . Ciprofloxacin     Per pt, "thinks caused rash"  . Metronidazole     REACTION: GI upset  . Penicillins     REACTION: childhood, hives?  . Phenytoin     REACTION: rash, leg swelling  . Sulfamethoxazole Other (See Comments)  . Trimethoprim Other (See Comments)  . Bactrim Other (See Comments)    Caused headaches, that progressively worsened when on medication. Once off, they went away.  . Phenytoin Sodium Extended Swelling and Rash    Swelling of ankles. Happened years ago per patient.      Review of Systems    Constitutional: Negative for fever and chills.  Musculoskeletal: Negative for arthralgias.  Skin: Negative for rash.  Neurological: Negative for headaches.       Objective:   Physical Exam  Constitutional: He appears well-developed and well-nourished.  Cardiovascular: Normal rate and regular rhythm.   Pulmonary/Chest: Effort normal and breath sounds normal. No respiratory distress. He has no wheezes. He has no rales.  Skin:  Patient has small punctate area of ecchymosis about 5 mm diameter right lateral proximal leg. No evidence for retained foreign body. No surrounding erythema. Nontender.          Assessment & Plan:   Tick bite right lateral leg. Patient brings in tick today and this appears to be most compatible with deer tick. We reviewed signs and symptoms of tickborne illness including Lyme disease and also [facility] spotted fever. Observe for now. No indication for antibiotic at this time.  [name] MD [facility] at [facility]

## 2016-01-06 ENCOUNTER — Ambulatory Visit (INDEPENDENT_AMBULATORY_CARE_PROVIDER_SITE_OTHER): Payer: Medicare Other | Admitting: Neurology

## 2016-01-06 ENCOUNTER — Encounter: Payer: Self-pay | Admitting: Neurology

## 2016-01-06 VITALS — BP 110/74 | HR 56 | Ht 71.0 in | Wt 149.2 lb

## 2016-01-06 DIAGNOSIS — G609 Hereditary and idiopathic neuropathy, unspecified: Secondary | ICD-10-CM

## 2016-01-06 DIAGNOSIS — G40909 Epilepsy, unspecified, not intractable, without status epilepticus: Secondary | ICD-10-CM | POA: Diagnosis not present

## 2016-01-06 NOTE — Patient Instructions (Signed)
Return to clinic in 1 year.

## 2016-01-06 NOTE — Progress Notes (Signed)
Follow-up Visit   Date: 01/06/2016    Nathan [name] MRN: [id number] DOB: Mar 13, 1946   Interim History: Nathan [name] is a 70 y.o. right-handed Caucasian with seizure disorder (maintained on PHB, seizure free since 2003) and restless leg syndrome returning to the clinic for follow-up of neuropathy.  The patient was accompanied to the clinic by wife who also provides collateral information.    History of present illness: Starting around 2014, patient developed numbness/tingling of the soles of his feet which was constant. He would wear compression stockings for varicose veins and noticed the numbness when he would take them off in the evening and when he would shower. Six months prior to symptom onset, he was getting varicose vein injections throughout his lower extremities, more on the right. His last treatment was in October 2014. In April 2015, his numbness suddenly worsened to involve his lower legs and knees, worse on the right. Over the past few months, his leg numbness has improved some. There is no associated weakness, low back pain, or bowel/bladder dysfunction. He endorses mild problems with balance and has been doing home balance exercises. He is on a very strict low-sugar, low-salt, vegetarian diet and reports that since taking vitamin B12 oral supplements, symptoms may have improved. Recent blood working including vitamin B12 level is normal.  Patient was seen at [facility] by Dr. Leta Baptist in September 2015 for bilateral feet numbness. EMG of the leg showed lumbar radiculopathy so he ordered MRI lumbar spine which showed multilevel degenerative changes, without foraminal stenosis. Patient sought the opinion of Dr. Vertell Limber for his lumbar radiculopathy who did not feel that symptoms were due to lumbar disease, and instead was concerning about neuropathy, so referred him for second opinion.   Of note, he also has a history of seizure disorder which started at age 70 years old. He  has had 3 grand mal seizures, all occurring at nighttime during sleep, with the first occuring in 1994. He was first placed on Dilantin, but due to ankle swelling he was switched to tegretol, but this caused him to be sleepy. He was transitioned to phenobarbital 30 mg which controlled his seizures. He had a second seizure in 1997 but then was seizure free until February 2003 during a period when he was taken off phenobarbital for 3-4 months prior, due to long period of seizure freedom. After his last breakthrough seizure he has been on phenobarbital 60 mg at night.  UPDATE 01/06/2015:  He has remained relatively stable over the past year, without worsening paresthesias.  He continues numbness over the lower legs.  There is no painful paresthesias.  He has mild imbalance and has not suffered any falls.  No interval hospitalization, illness, or seizures.    Medications:  Current Outpatient Prescriptions on File Prior to Visit  Medication Sig Dispense Refill  . Calcium-Magnesium-Zinc 167-83-8 MG TABS Take 3 tablets by mouth daily.    . cholecalciferol (VITAMIN D) 1000 UNITS tablet Take 1,000 Units by mouth every Monday, Wednesday, and Friday.     . Cod Liver Oil OIL Take 5 mLs by mouth daily.      . Coenzyme Q10 (CO Q 10) 60 MG CAPS Take 1 tablet by mouth daily.      . cyanocobalamin 500 MCG tablet Take 1,000 mcg by mouth daily.    . Homeopathic Products (SIMILASAN DRY EYE RELIEF) SOLN Apply 1 drop to eye daily as needed. Dry Eyes     . hyoscyamine (LEVSIN/SL) 0.125 MG  SL tablet Place 1 tablet (0.125 mg total) under the tongue every 4 (four) hours as needed. 30 tablet 1  . PHENObarbital (LUMINAL) 60 MG tablet Take 1 tablet (60 mg total) by mouth at bedtime. 90 tablet 1  . Probiotic Product (PROBIOTIC DAILY PO) Take by mouth daily.    . vitamin C (ASCORBIC ACID) 500 MG tablet Take 500 mg by mouth 3 (three) times daily.       No current facility-administered medications on file prior to visit.     Allergies:  Allergies  Allergen Reactions  . Carbamazepine     REACTION: fatigue, sleepy  . Ciprofloxacin     Per pt, "thinks caused rash"  . Metronidazole     REACTION: GI upset  . Penicillins     REACTION: childhood, hives?  . Phenytoin     REACTION: rash, leg swelling  . Sulfamethoxazole Other (See Comments)  . Trimethoprim Other (See Comments)  . Bactrim Other (See Comments)    Caused headaches, that progressively worsened when on medication. Once off, they went away.  . Phenytoin Sodium Extended Swelling and Rash    Swelling of ankles. Happened years ago per patient.    Review of Systems:  CONSTITUTIONAL: No fevers, chills, night sweats, or weight loss.  EYES: No visual changes or eye pain ENT: No hearing changes.  No history of nose bleeds.   RESPIRATORY: No cough, wheezing and shortness of breath.   CARDIOVASCULAR: Negative for chest pain, and palpitations.   GI: Negative for abdominal discomfort, blood in stools or black stools.  No recent change in bowel habits.   GU:  No history of incontinence.   MUSCLOSKELETAL: No history of joint pain or swelling.  No myalgias.   SKIN: Negative for lesions, rash, and itching.   ENDOCRINE: Negative for cold or heat intolerance, polydipsia or goiter.   PSYCH:  No depression or anxiety symptoms.   NEURO: As Above.   Vital Signs:  BP 110/74 mmHg  Pulse 56  Ht '5\' 11"'$  (1.803 m)  Wt 149 lb 3 oz (67.671 kg)  BMI 20.82 kg/m2  SpO2 99%  Neurological Exam: MENTAL STATUS including orientation to time, place, person, recent and remote memory, attention span and concentration, language, and fund of knowledge is normal.  Speech is not dysarthric.  CRANIAL NERVES: No visual field defects.  Pupils equal round and reactive to light.  Normal conjugate, extra-ocular eye movements in all directions of gaze.  Face is symmetric. Palate elevates symmetrically.  Tongue is midline.  MOTOR:  Motor strength is 5/5 in all extremities, except  toe flexion and extension is 5-/5.  No atrophy, fasciculations or abnormal movements.  No pronator drift.  Tone is normal.    MSRs:  Reflexes are 2+/4 throughout, except absent Achilles.  Negative Rhomberg.  SENSORY:  Reduced vibration distal to ankles (trace) and absent at the great toe bilaterally. Proprioception is impaired on the right at the great toe. Hyperesthesia to pin prick distal to mid-leg and into foot. Romberg's sign absent.Marland Kitchen  COORDINATION/GAIT:  Intact rapid alternating movements bilaterally.  Gait narrow based and stable.  Very mild unsteadiness with tandem gait, improved from last year.   Data: EMG performed by Dr. Leta Baptist at [facility] 04/21/2014: Abnormal study demonstrating: 1. Evidence of bilateral lumbar radiculopathies. 2. No evidence of underlying widespread polyneuropathy.  MRI lumbar spine 05/11/2014: 1. Mild multilevel lumbar disc degeneration and facet arthrosis resulting in mild bilateral lateral recess stenosis at L3-4 and L4-5 and moderate left neural foraminal  stenosis at L3-4. No spinal canal stenosis. 2. Diffuse bone marrow heterogeneity, nonspecific. Considerations include anemia, smoking, and infiltrative processes (including multiple myeloma).  Labs 09/15/2014:  Vitamin B6 15.5, vitamin B1 14, copper 80, zinc 62 Lab Results  Component Value Date   TSH 1.13 01/04/2015   Lab Results  Component Value Date   VITAMINB12 678 08/19/2014     IMPRESSION/PLAN: 1.  Idiopathic distal and symmetric peripheral neuropathy, stable  - Predominately numbness without associated pain  - Continue home balance exercises   2.  Grand mal seizures, last seizure in 2003.  Stable on phenobarbitol '60mg'$  daily  Return to clinic in 1 year   The duration of this appointment visit was 20 minutes of face-to-face time with the patient.  Greater than 50% of this time was spent in counseling, explanation of diagnosis, planning of further management, and coordination of  care.   Thank you for allowing me to participate in patient's care.  If I can answer any additional questions, I would be pleased to do so.    Sincerely,    Donika K. Posey Pronto, DO

## 2016-02-15 ENCOUNTER — Other Ambulatory Visit (INDEPENDENT_AMBULATORY_CARE_PROVIDER_SITE_OTHER): Payer: Medicare Other

## 2016-02-15 DIAGNOSIS — Z0000 Encounter for general adult medical examination without abnormal findings: Secondary | ICD-10-CM

## 2016-02-15 LAB — CBC WITH DIFFERENTIAL/PLATELET
Basophils Absolute: 0 K/uL (ref 0.0–0.1)
Basophils Relative: 0.7 % (ref 0.0–3.0)
Eosinophils Absolute: 0.1 K/uL (ref 0.0–0.7)
Eosinophils Relative: 2.2 % (ref 0.0–5.0)
HCT: 39 % (ref 39.0–52.0)
Hemoglobin: 13.4 g/dL (ref 13.0–17.0)
Lymphocytes Relative: 43 % (ref 12.0–46.0)
Lymphs Abs: 1.2 K/uL (ref 0.7–4.0)
MCHC: 34.4 g/dL (ref 30.0–36.0)
MCV: 93.5 fl (ref 78.0–100.0)
Monocytes Absolute: 0.5 K/uL (ref 0.1–1.0)
Monocytes Relative: 17.1 % — ABNORMAL HIGH (ref 3.0–12.0)
Neutro Abs: 1 K/uL — ABNORMAL LOW (ref 1.4–7.7)
Neutrophils Relative %: 37 % — ABNORMAL LOW (ref 43.0–77.0)
Platelets: 153 K/uL (ref 150.0–400.0)
RBC: 4.17 Mil/uL — ABNORMAL LOW (ref 4.22–5.81)
RDW: 13.3 % (ref 11.5–15.5)
WBC: 2.8 K/uL — ABNORMAL LOW (ref 4.0–10.5)

## 2016-02-15 LAB — BASIC METABOLIC PANEL WITH GFR
BUN: 13 mg/dL (ref 6–23)
CO2: 34 meq/L — ABNORMAL HIGH (ref 19–32)
Calcium: 9.1 mg/dL (ref 8.4–10.5)
Chloride: 101 meq/L (ref 96–112)
Creatinine, Ser: 0.79 mg/dL (ref 0.40–1.50)
GFR: 103.36 mL/min
Glucose, Bld: 93 mg/dL (ref 70–99)
Potassium: 4 meq/L (ref 3.5–5.1)
Sodium: 141 meq/L (ref 135–145)

## 2016-02-15 LAB — HEPATIC FUNCTION PANEL
ALT: 22 U/L (ref 0–53)
AST: 26 U/L (ref 0–37)
Albumin: 4.2 g/dL (ref 3.5–5.2)
Alkaline Phosphatase: 56 U/L (ref 39–117)
Bilirubin, Direct: 0.2 mg/dL (ref 0.0–0.3)
Total Bilirubin: 0.7 mg/dL (ref 0.2–1.2)
Total Protein: 5.9 g/dL — ABNORMAL LOW (ref 6.0–8.3)

## 2016-02-15 LAB — LIPID PANEL
Cholesterol: 148 mg/dL (ref 0–200)
HDL: 82 mg/dL
LDL Cholesterol: 59 mg/dL (ref 0–99)
NonHDL: 65.55
Total CHOL/HDL Ratio: 2
Triglycerides: 31 mg/dL (ref 0.0–149.0)
VLDL: 6.2 mg/dL (ref 0.0–40.0)

## 2016-02-15 LAB — TSH: TSH: 1.28 u[IU]/mL (ref 0.35–4.50)

## 2016-02-15 LAB — PSA: PSA: 0.65 ng/mL (ref 0.10–4.00)

## 2016-02-22 ENCOUNTER — Ambulatory Visit (INDEPENDENT_AMBULATORY_CARE_PROVIDER_SITE_OTHER): Payer: Medicare Other | Admitting: Family Medicine

## 2016-02-22 ENCOUNTER — Encounter: Payer: Self-pay | Admitting: Family Medicine

## 2016-02-22 VITALS — BP 90/70 | HR 64 | Temp 98.3°F | Ht 70.25 in | Wt 144.5 lb

## 2016-02-22 DIAGNOSIS — Z0000 Encounter for general adult medical examination without abnormal findings: Secondary | ICD-10-CM | POA: Diagnosis not present

## 2016-02-22 MED ORDER — PHENOBARBITAL 60 MG PO TABS: 60.0000 mg | ORAL_TABLET | Freq: Every day | ORAL | 1 refills | Status: DC

## 2016-02-22 NOTE — Progress Notes (Signed)
Pre visit review using our clinic review tool, if applicable. No additional management support is needed unless otherwise documented below in the visit note. 

## 2016-02-22 NOTE — Patient Instructions (Signed)
Fall Prevention in the Home  Falls can cause injuries and can affect people from all age groups. There are many simple things that you can do to make your home safe and to help prevent falls. WHAT CAN I DO ON THE OUTSIDE OF MY HOME?  Regularly repair the edges of walkways and driveways and fix any cracks.  Remove high doorway thresholds.  Trim any shrubbery on the main path into your home.  Use bright outdoor lighting.  Clear walkways of debris and clutter, including tools and rocks.  Regularly check that handrails are securely fastened and in good repair. Both sides of any steps should have handrails.  Install guardrails along the edges of any raised decks or porches.  Have leaves, snow, and ice cleared regularly.  Use sand or salt on walkways during winter months.  In the garage, clean up any spills right away, including grease or oil spills. WHAT CAN I DO IN THE BATHROOM?  Use night lights.  Install grab bars by the toilet and in the tub and shower. Do not use towel bars as grab bars.  Use non-skid mats or decals on the floor of the tub or shower.  If you need to sit down while you are in the shower, use a plastic, non-slip stool..  Keep the floor dry. Immediately clean up any water that spills on the floor.  Remove soap buildup in the tub or shower on a regular basis.  Attach bath mats securely with double-sided non-slip rug tape.  Remove throw rugs and other tripping hazards from the floor. WHAT CAN I DO IN THE BEDROOM?  Use night lights.  Make sure that a bedside light is easy to reach.  Do not use oversized bedding that drapes onto the floor.  Have a firm chair that has side arms to use for getting dressed.  Remove throw rugs and other tripping hazards from the floor. WHAT CAN I DO IN THE KITCHEN?   Clean up any spills right away.  Avoid walking on wet floors.  Place frequently used items in easy-to-reach places.  If you need to reach for something  above you, use a sturdy step stool that has a grab bar.  Keep electrical cables out of the way.  Do not use floor polish or wax that makes floors slippery. If you have to use wax, make sure that it is non-skid floor wax.  Remove throw rugs and other tripping hazards from the floor. WHAT CAN I DO IN THE STAIRWAYS?  Do not leave any items on the stairs.  Make sure that there are handrails on both sides of the stairs. Fix handrails that are broken or loose. Make sure that handrails are as long as the stairways.  Check any carpeting to make sure that it is firmly attached to the stairs. Fix any carpet that is loose or worn.  Avoid having throw rugs at the top or bottom of stairways, or secure the rugs with carpet tape to prevent them from moving.  Make sure that you have a light switch at the top of the stairs and the bottom of the stairs. If you do not have them, have them installed. WHAT ARE SOME OTHER FALL PREVENTION TIPS?  Wear closed-toe shoes that fit well and support your feet. Wear shoes that have rubber soles or low heels.  When you use a stepladder, make sure that it is completely opened and that the sides are firmly locked. Have someone hold the ladder while you   are using it. Do not climb a closed stepladder.  Add color or contrast paint or tape to grab bars and handrails in your home. Place contrasting color strips on the first and last steps.  Use mobility aids as needed, such as canes, walkers, scooters, and crutches.  Turn on lights if it is dark. Replace any light bulbs that burn out.  Set up furniture so that there are clear paths. Keep the furniture in the same spot.  Fix any uneven floor surfaces.  Choose a carpet design that does not hide the edge of steps of a stairway.  Be aware of any and all pets.  Review your medicines with your healthcare provider. Some medicines can cause dizziness or changes in blood pressure, which increase your risk of falling. Talk  with your health care provider about other ways that you can decrease your risk of falls. This may include working with a physical therapist or trainer to improve your strength, balance, and endurance.   This information is not intended to replace advice given to you by your health care provider. Make sure you discuss any questions you have with your health care provider.   Document Released: 06/22/2002 Document Revised: 11/16/2014 Document Reviewed: 08/06/2014 Elsevier Interactive Patient Education 2016 Elsevier Inc.  

## 2016-02-22 NOTE — Progress Notes (Signed)
Subjective:     Patient ID: JACOBSON MILAN, male   DOB: June 27, 1946, 70 y.o.   MRN: [id number]  HPI   Patient seen for physical exam. His chronic problems include history of B12 deficiency, history of adenomatous colon polyps, remote history of seizure, restless leg syndrome He takes no medications other than phenobarbital. Health maintenance is up-to-date. No specific risk factors for hepatitis C and he declines screening.  He's had some recent mild weight loss but excellent appetite. He attributes this to exercising about one and one half hours per day. No recent falls.  Past Medical History:  Diagnosis Date  . Colon polyps    Sessile Serrated adenomas  . DYSPNEA 02/09/2009  . RESTLESS LEGS SYNDROME 02/28/2009  . SEIZURE DISORDER 02/09/2009   last seisure Sep 10, 2001   Past Surgical History:  Procedure Laterality Date  . APPENDECTOMY  06/01/11  . HERNIA REPAIR Right 02/11/09   dr. Donella Stade -[facility]  . LAPAROSCOPIC APPENDECTOMY  06/01/2011   Procedure: APPENDECTOMY LAPAROSCOPIC;  Surgeon: Shann Medal, MD;  Location: [facility];  Service: General;  Laterality: N/A;    reports that he has never smoked. He has never used smokeless tobacco. He reports that he does not drink alcohol or use drugs. family history includes Cancer in his father and mother; Diabetes Mellitus II in his father. Allergies  Allergen Reactions  . Carbamazepine     REACTION: fatigue, sleepy  . Ciprofloxacin     Per pt, "thinks caused rash"  . Metronidazole     REACTION: GI upset  . Penicillins     REACTION: childhood, hives?  . Phenytoin     REACTION: rash, leg swelling  . Sulfamethoxazole Other (See Comments)  . Trimethoprim Other (See Comments)  . Bactrim Other (See Comments)    Caused headaches, that progressively worsened when on medication. Once off, they went away.  . Phenytoin Sodium Extended Swelling and Rash    Swelling of ankles. Happened years ago per patient.     Review of Systems   Constitutional: Negative for activity change, appetite change, fatigue and fever.  HENT: Negative for congestion, ear pain and trouble swallowing.   Eyes: Negative for pain and visual disturbance.  Respiratory: Negative for cough, shortness of breath and wheezing.   Cardiovascular: Negative for chest pain and palpitations.  Gastrointestinal: Negative for abdominal distention, abdominal pain, blood in stool, constipation, diarrhea, nausea, rectal pain and vomiting.  Genitourinary: Negative for dysuria, hematuria and testicular pain.  Musculoskeletal: Negative for arthralgias and joint swelling.  Skin: Negative for rash.  Neurological: Negative for dizziness, syncope and headaches.  Hematological: Negative for adenopathy.  Psychiatric/Behavioral: Negative for confusion and dysphoric mood.       Objective:   Physical Exam  Constitutional: He is oriented to person, place, and time. He appears well-developed and well-nourished.  HENT:  Right Ear: External ear normal.  Left Ear: External ear normal.  Mouth/Throat: Oropharynx is clear and moist.  Eyes: Pupils are equal, round, and reactive to light.  Neck: Neck supple. No thyromegaly present.  Cardiovascular: Normal rate and regular rhythm.  Exam reveals no gallop.   Pulmonary/Chest: Effort normal and breath sounds normal. No respiratory distress. He has no wheezes. He has no rales.  Abdominal: Soft. Bowel sounds are normal. He exhibits no distension and no mass. There is no tenderness. There is no rebound and no guarding.  Genitourinary: Rectum normal and prostate normal.  Musculoskeletal: He exhibits no edema or tenderness.  Lymphadenopathy:  He has no cervical adenopathy.  Neurological: He is alert and oriented to person, place, and time. No cranial nerve deficit.  Skin: No rash noted.  Psychiatric: He has a normal mood and affect. His behavior is normal. Judgment and thought content normal.       Assessment:     Physical exam.  Health maintenance up-to-date. Declines hepatitis C screening.    Plan:     -reminder for annual flu vaccine -Labs reviewed with no major concerns -Continue regular exercise habits -Fall prevention discussed -Will need tetanus booster by next year.  [name] MD [facility] at [facility]

## 2016-02-29 ENCOUNTER — Other Ambulatory Visit (INDEPENDENT_AMBULATORY_CARE_PROVIDER_SITE_OTHER): Payer: Medicare Other

## 2016-02-29 DIAGNOSIS — K921 Melena: Secondary | ICD-10-CM | POA: Diagnosis not present

## 2016-02-29 LAB — POC HEMOCCULT BLD/STL (HOME/3-CARD/SCREEN)
Card #2 Fecal Occult Blod, POC: NEGATIVE
Card #3 Fecal Occult Blood, POC: NEGATIVE
Fecal Occult Blood, POC: NEGATIVE

## 2016-03-01 ENCOUNTER — Encounter: Payer: Self-pay | Admitting: Family Medicine

## 2016-04-13 ENCOUNTER — Ambulatory Visit (INDEPENDENT_AMBULATORY_CARE_PROVIDER_SITE_OTHER): Payer: Medicare Other | Admitting: *Deleted

## 2016-04-13 DIAGNOSIS — Z23 Encounter for immunization: Secondary | ICD-10-CM

## 2016-09-10 ENCOUNTER — Other Ambulatory Visit: Payer: Self-pay

## 2016-09-10 MED ORDER — PHENOBARBITAL 60 MG PO TABS: 60.0000 mg | ORAL_TABLET | Freq: Every day | ORAL | 1 refills | Status: DC

## 2016-11-23 ENCOUNTER — Ambulatory Visit (INDEPENDENT_AMBULATORY_CARE_PROVIDER_SITE_OTHER): Payer: Medicare Other | Admitting: Family Medicine

## 2016-11-23 VITALS — BP 124/70 | HR 73 | Ht 71.0 in | Wt 145.0 lb

## 2016-11-23 DIAGNOSIS — Z0000 Encounter for general adult medical examination without abnormal findings: Secondary | ICD-10-CM | POA: Diagnosis not present

## 2016-11-23 LAB — CBC WITH DIFFERENTIAL/PLATELET
Basophils Absolute: 0 10*3/uL (ref 0.0–0.1)
Basophils Relative: 1.4 % (ref 0.0–3.0)
EOS PCT: 1.2 % (ref 0.0–5.0)
Eosinophils Absolute: 0 10*3/uL (ref 0.0–0.7)
HCT: 40.7 % (ref 39.0–52.0)
Hemoglobin: 14.1 g/dL (ref 13.0–17.0)
LYMPHS ABS: 0.9 10*3/uL (ref 0.7–4.0)
LYMPHS PCT: 29.1 % (ref 12.0–46.0)
MCHC: 34.7 g/dL (ref 30.0–36.0)
MCV: 93.8 fl (ref 78.0–100.0)
MONOS PCT: 14.6 % — AB (ref 3.0–12.0)
Monocytes Absolute: 0.5 10*3/uL (ref 0.1–1.0)
NEUTROS PCT: 53.7 % (ref 43.0–77.0)
Neutro Abs: 1.8 10*3/uL (ref 1.4–7.7)
PLATELETS: 203 10*3/uL (ref 150.0–400.0)
RBC: 4.34 Mil/uL (ref 4.22–5.81)
RDW: 13.4 % (ref 11.5–15.5)
WBC: 3.3 10*3/uL — AB (ref 4.0–10.5)

## 2016-11-23 LAB — HEPATIC FUNCTION PANEL
ALT: 19 U/L (ref 0–53)
AST: 24 U/L (ref 0–37)
Albumin: 4.5 g/dL (ref 3.5–5.2)
Alkaline Phosphatase: 80 U/L (ref 39–117)
Bilirubin, Direct: 0.1 mg/dL (ref 0.0–0.3)
Total Bilirubin: 0.6 mg/dL (ref 0.2–1.2)
Total Protein: 6.5 g/dL (ref 6.0–8.3)

## 2016-11-23 LAB — LIPID PANEL
Cholesterol: 149 mg/dL (ref 0–200)
HDL: 82.9 mg/dL (ref >39.00)
LDL Cholesterol: 58 mg/dL (ref 0–99)
NONHDL: 65.89
Total CHOL/HDL Ratio: 2
Triglycerides: 39 mg/dL (ref 0.0–149.0)
VLDL: 7.8 mg/dL (ref 0.0–40.0)

## 2016-11-23 LAB — BASIC METABOLIC PANEL
BUN: 9 mg/dL (ref 6–23)
CALCIUM: 9.3 mg/dL (ref 8.4–10.5)
CO2: 34 mEq/L — ABNORMAL HIGH (ref 19–32)
Chloride: 99 mEq/L (ref 96–112)
Creatinine, Ser: 0.68 mg/dL (ref 0.40–1.50)
GFR: 122.6 mL/min (ref >60.00)
GLUCOSE: 90 mg/dL (ref 70–99)
Potassium: 4.2 mEq/L (ref 3.5–5.1)
SODIUM: 138 meq/L (ref 135–145)

## 2016-11-23 LAB — TSH: TSH: 0.7 u[IU]/mL (ref 0.35–4.50)

## 2016-11-23 LAB — PSA: PSA: 0.98 ng/mL (ref 0.10–4.00)

## 2016-11-23 NOTE — Patient Instructions (Addendum)
Mr. Nathan [name] , Thank you for taking time to come for your Medicare Wellness Visit. I appreciate your ongoing commitment to your health goals. Please review the following plan we discussed and let me know if I can assist you in the future.   To bring a copy of HCPOA and Living will at the next [place] - can assist with hearing aid x 1  No reviews  [facility]  [id number]  [phone] - [place]   A Tetanus is recommended every 10 years. Medicare covers a tetanus if you have a cut or wound; otherwise, there may be a charge. If you had not had a tetanus with pertusses, known as the Tdap, you can take this anytime.     These are the goals we discussed: Goals    . patient          To continue to eat well and exercise        This is a list of the screening recommended for you and due dates:  Health Maintenance  Topic Date Due  .  Hepatitis C: One time screening is recommended by Center for Disease Control  (CDC) for  adults born from [birth year] through 1965.   02/21/2017*  . Tetanus Vaccine  01/27/2017  . Flu Shot  02/13/2017  . Colon Cancer Screening  11/25/2018  . Pneumonia vaccines  Completed  *Topic was postponed. The date shown is not the original due date.    Health Maintenance, Male A healthy lifestyle and preventive care is important for your health and wellness. Ask your health care provider about what schedule of regular examinations is right for you. What should I know about weight and diet?  Eat a Healthy Diet  Eat plenty of vegetables, fruits, whole grains, low-fat dairy products, and lean protein.  Do not eat a lot of foods high in solid fats, added sugars, or salt. Maintain a Healthy Weight  Regular exercise can help you achieve or maintain a healthy weight. You should:  Do at least 150 minutes of exercise each week. The exercise should increase your heart rate and make you sweat  (moderate-intensity exercise).  Do strength-training exercises at least twice a week. Watch Your Levels of Cholesterol and Blood Lipids  Have your blood tested for lipids and cholesterol every 5 years starting at 70 years of age. If you are at high risk for heart disease, you should start having your blood tested when you are 70 years old. You may need to have your cholesterol levels checked more often if:  Your lipid or cholesterol levels are high.  You are older than 70 years of age.  You are at high risk for heart disease. What should I know about cancer screening? Many types of cancers can be detected early and may often be prevented. Lung Cancer  You should be screened every year for lung cancer if:  You are a current smoker who has smoked for at least 30 years.  You are a former smoker who has quit within the past 15 years.  Talk to your health care provider about your screening options, when you should start screening, and how often you should be screened. Colorectal Cancer  Routine colorectal cancer screening usually begins at 70 years of age and should be repeated every 5-10 years until you are 70 years old. You may need to be screened more often  if early forms of precancerous polyps or small growths are found. Your health care provider may recommend screening at an earlier age if you have risk factors for colon cancer.  Your health care provider may recommend using home test kits to check for hidden blood in the stool.  A small camera at the end of a tube can be used to examine your colon (sigmoidoscopy or colonoscopy). This checks for the earliest forms of colorectal cancer. Prostate and Testicular Cancer  Depending on your age and overall health, your health care provider may do certain tests to screen for prostate and testicular cancer.  Talk to your health care provider about any symptoms or concerns you have about testicular or prostate cancer. Skin Cancer  Check  your skin from head to toe regularly.  Tell your health care provider about any new moles or changes in moles, especially if:  There is a change in a mole's size, shape, or color.  You have a mole that is larger than a pencil eraser.  Always use sunscreen. Apply sunscreen liberally and repeat throughout the day.  Protect yourself by wearing long sleeves, pants, a wide-brimmed hat, and sunglasses when outside. What should I know about heart disease, diabetes, and high blood pressure?  If you are 31-42 years of age, have your blood pressure checked every 3-5 years. If you are 6 years of age or older, have your blood pressure checked every year. You should have your blood pressure measured twice-once when you are at a hospital or clinic, and once when you are not at a hospital or clinic. Record the average of the two measurements. To check your blood pressure when you are not at a hospital or clinic, you can use:  An automated blood pressure machine at a pharmacy.  A home blood pressure monitor.  Talk to your health care provider about your target blood pressure.  If you are between 79-61 years old, ask your health care provider if you should take aspirin to prevent heart disease.  Have regular diabetes screenings by checking your fasting blood sugar level.  If you are at a normal weight and have a low risk for diabetes, have this test once every three years after the age of 72.  If you are overweight and have a high risk for diabetes, consider being tested at a younger age or more often.  A one-time screening for abdominal aortic aneurysm (AAA) by ultrasound is recommended for men aged 54-75 years who are current or former smokers. What should I know about preventing infection? Hepatitis B  If you have a higher risk for hepatitis B, you should be screened for this virus. Talk with your health care provider to find out if you are at risk for hepatitis B infection. Hepatitis C  Blood  testing is recommended for:  Everyone born from [birth year] through 1964.  Anyone with known risk factors for hepatitis C. Sexually Transmitted Diseases (STDs)  You should be screened each year for STDs including gonorrhea and chlamydia if:  You are sexually active and are younger than 70 years of age.  You are older than 70 years of age and your health care provider tells you that you are at risk for this type of infection.  Your sexual activity has changed since you were last screened and you are at an increased risk for chlamydia or gonorrhea. Ask your health care provider if you are at risk.  Talk with your health care provider about whether you  are at high risk of being infected with HIV. Your health care provider may recommend a prescription medicine to help prevent HIV infection. What else can I do?  Schedule regular health, dental, and eye exams.  Stay current with your vaccines (immunizations).  Do not use any tobacco products, such as cigarettes, chewing tobacco, and e-cigarettes. If you need help quitting, ask your health care provider.  Limit alcohol intake to no more than 2 drinks per day. One drink equals 12 ounces of beer, 5 ounces of wine, or 1 ounces of hard liquor.  Do not use street drugs.  Do not share needles.  Ask your health care provider for help if you need support or information about quitting drugs.  Tell your health care provider if you often feel depressed.  Tell your health care provider if you have ever been abused or do not feel safe at home. This information is not intended to replace advice given to you by your health care provider. Make sure you discuss any questions you have with your health care provider. Document Released: 12/29/2007 Document Revised: 02/29/2016 Document Reviewed: 04/05/2015 Elsevier Interactive Patient Education  2017 [place] Prevention in the Home Falls can cause injuries and can affect people from all age  groups. There are many simple things that you can do to make your home safe and to help prevent falls. What can I do on the outside of my home?  Regularly repair the edges of walkways and driveways and fix any cracks.  Remove high doorway thresholds.  Trim any shrubbery on the main path into your home.  Use bright outdoor lighting.  Clear walkways of debris and clutter, including tools and rocks.  Regularly check that handrails are securely fastened and in good repair. Both sides of any steps should have handrails.  Install guardrails along the edges of any raised decks or porches.  Have leaves, snow, and ice cleared regularly.  Use sand or salt on walkways during winter months.  In the garage, clean up any spills right away, including grease or oil spills. What can I do in the bathroom?  Use night lights.  Install grab bars by the toilet and in the tub and shower. Do not use towel bars as grab bars.  Use non-skid mats or decals on the floor of the tub or shower.  If you need to sit down while you are in the shower, use a plastic, non-slip stool.  Keep the floor dry. Immediately clean up any water that spills on the floor.  Remove soap buildup in the tub or shower on a regular basis.  Attach bath mats securely with double-sided non-slip rug tape.  Remove throw rugs and other tripping hazards from the floor. What can I do in the bedroom?  Use night lights.  Make sure that a bedside light is easy to reach.  Do not use oversized bedding that drapes onto the floor.  Have a firm chair that has side arms to use for getting dressed.  Remove throw rugs and other tripping hazards from the floor. What can I do in the kitchen?  Clean up any spills right away.  Avoid walking on wet floors.  Place frequently used items in easy-to-reach places.  If you need to reach for something above you, use a sturdy step stool that has a grab bar.  Keep electrical cables out of the  way.  Do not use floor polish or wax that makes floors slippery.  If you have to use wax, make sure that it is non-skid floor wax.  Remove throw rugs and other tripping hazards from the floor. What can I do in the stairways?  Do not leave any items on the stairs.  Make sure that there are handrails on both sides of the stairs. Fix handrails that are broken or loose. Make sure that handrails are as long as the stairways.  Check any carpeting to make sure that it is firmly attached to the stairs. Fix any carpet that is loose or worn.  Avoid having throw rugs at the top or bottom of stairways, or secure the rugs with carpet tape to prevent them from moving.  Make sure that you have a light switch at the top of the stairs and the bottom of the stairs. If you do not have them, have them installed. What are some other fall prevention tips?  Wear closed-toe shoes that fit well and support your feet. Wear shoes that have rubber soles or low heels.  When you use a stepladder, make sure that it is completely opened and that the sides are firmly locked. Have someone hold the ladder while you are using it. Do not climb a closed stepladder.  Add color or contrast paint or tape to grab bars and handrails in your home. Place contrasting color strips on the first and last steps.  Use mobility aids as needed, such as canes, walkers, scooters, and crutches.  Turn on lights if it is dark. Replace any light bulbs that burn out.  Set up furniture so that there are clear paths. Keep the furniture in the same spot.  Fix any uneven floor surfaces.  Choose a carpet design that does not hide the edge of steps of a stairway.  Be aware of any and all pets.  Review your medicines with your healthcare provider. Some medicines can cause dizziness or changes in blood pressure, which increase your risk of falling. Talk with your health care provider about other ways that you can decrease your risk of falls. This  may include working with a physical therapist or trainer to improve your strength, balance, and endurance. This information is not intended to replace advice given to you by your health care provider. Make sure you discuss any questions you have with your health care provider. Document Released: 06/22/2002 Document Revised: 11/29/2015 Document Reviewed: 08/06/2014 Elsevier Interactive Patient Education  2017 [place] on Shingrix vaccine- if interested Consider Tdap this summer Check on coverage for DEXA scan

## 2016-11-23 NOTE — Progress Notes (Signed)
Subjective:     Patient ID: Nathan [name], male   DOB: 22-Sep-1945, 71 y.o.   MRN: [id number]  HPI Patient seen for complete physical.  Also here for Medicare wellness visit today with our health coach. He is generally fairly healthy. He has very thin body habitus. He exercises regularly. He has remote history of seizures and has been on phenobarbital for several years no seizure in several years now. 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 Has never been screened previously. He's had previous shingles vaccine. Needs tetanus this year. Colonoscopy 2015. No specific risk factors for hepatitis C other than age  Never smoked.   Past Medical History:  Diagnosis Date  . Colon polyps    Sessile Serrated adenomas  . DYSPNEA 02/09/2009  . RESTLESS LEGS SYNDROME 02/28/2009  . SEIZURE DISORDER 02/09/2009   last seisure Sep 10, 2001   Past Surgical History:  Procedure Laterality Date  . APPENDECTOMY  06/01/11  . HERNIA REPAIR Right 02/11/09   dr. Donella Stade -[facility]  . LAPAROSCOPIC APPENDECTOMY  06/01/2011   Procedure: APPENDECTOMY LAPAROSCOPIC;  Surgeon: Shann Medal, MD;  Location: [facility];  Service: General;  Laterality: N/A;    reports that he has never smoked. He has never used smokeless tobacco. He reports that he does not drink alcohol or use drugs. family history includes Cancer in his father and mother; Diabetes Mellitus II in his father. Allergies  Allergen Reactions  . Carbamazepine     REACTION: fatigue, sleepy  . Ciprofloxacin     Per pt, "thinks caused rash"  . Clindamycin/Lincomycin Diarrhea  . Metronidazole     REACTION: GI upset  . Penicillins     REACTION: childhood, hives?  . Phenytoin     REACTION: rash, leg swelling  . Sulfamethoxazole Other (See Comments)  . Trimethoprim Other (See Comments)  . Bactrim Other (See Comments)    Caused headaches, that progressively worsened when on medication. Once off, they went away.  . Phenytoin  Sodium Extended Swelling and Rash    Swelling of ankles. Happened years ago per patient.     Review of Systems  Constitutional: Negative for activity change, appetite change, fatigue and fever.  HENT: Negative for congestion, ear pain and trouble swallowing.   Eyes: Negative for pain and visual disturbance.  Respiratory: Negative for cough, shortness of breath and wheezing.   Cardiovascular: Negative for chest pain and palpitations.  Gastrointestinal: Negative for abdominal distention, abdominal pain, blood in stool, constipation, diarrhea, nausea, rectal pain and vomiting.  Genitourinary: Negative for dysuria, hematuria and testicular pain.  Musculoskeletal: Negative for arthralgias and joint swelling.  Skin: Negative for rash.  Neurological: Negative for dizziness, syncope and headaches.  Hematological: Negative for adenopathy.  Psychiatric/Behavioral: Negative for confusion and dysphoric mood.       Objective:   Physical Exam  Constitutional: He is oriented to person, place, and time. He appears well-developed and well-nourished. No distress.  HENT:  Head: Normocephalic and atraumatic.  Right Ear: External ear normal.  Left Ear: External ear normal.  Mouth/Throat: Oropharynx is clear and moist.  Eyes: Conjunctivae and EOM are normal. Pupils are equal, round, and reactive to light.  Neck: Normal range of motion. Neck supple. No thyromegaly present.  Cardiovascular: Normal rate, regular rhythm and normal heart sounds.   No murmur heard. Pulmonary/Chest: No respiratory distress. He has no wheezes. He has no rales.  Abdominal: Soft. Bowel sounds are normal. He exhibits no distension and no  mass. There is no tenderness. There is no rebound and no guarding.  Genitourinary: Prostate normal.  Genitourinary Comments: He has some firm stool in the rectal vault but no rectal mass otherwise. No impaction Prostate not enlarged and no nodules.  Musculoskeletal: He exhibits no edema.   Lymphadenopathy:    He has no cervical adenopathy.  Neurological: He is alert and oriented to person, place, and time. He displays normal reflexes. No cranial nerve deficit.  Skin: No rash noted.  Psychiatric: He has a normal mood and affect.       Assessment:     Physical exam. We discussed several things including new shingles vaccine, need for tetanus booster, and hepatitis C screening. Also discussed possible DEXA scan given his thin body habitus and chronic phenobarbital use    Plan:     -Check on insurance coverage for bone density scan -Obtain screening lab work include hepatitis C antibody -The natural history of prostate cancer and ongoing controversy regarding screening and potential treatment outcomes of prostate cancer has been discussed with the patient. The meaning of a false positive PSA and a false negative PSA has been discussed. He indicates understanding of the limitations of this screening test and wishes to proceed with screening PSA testing. -Continue regular weightbearing exercise  [name] MD [facility]

## 2016-11-23 NOTE — Progress Notes (Signed)
Subjective:   Nathan [name] is a 71 y.o. male who presents for Medicare Annual/Subsequent preventive examination.  HRA assessment completed during this visit with Ms. Gaus  The Patient was informed that the wellness visit is to identify future health risk and educate and initiate measures that can reduce risk for increased disease through the lifespan.    NO ROS; Medicare Wellness Visit Last OV:  today/ Labs completed: 02/2016 chol 148; trig 31, HDL 82; LDL57  BS 59  Family Hx mother and father had cancer; DM Describes health as fair, good or great? 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 at the end of the summer  Last seizure Feb 2003  Update:  Tobacco: never smoked  2nd Hand Smoke no Chew or electronic cigarettes none ETOH: NO  Medications  BMI:   Diet;  on a vegan diet Dec 1979 had impacted bowel  Added fiber; visited health food stores Does not eat refined sugar; decreased salt Makes his on condiments  Breakfast; eats organic  Eat eggs and salmon;  Wife fixes quick breads; muffins; pumpkin muffins;  Peanut butter; cashew  Beans  Collards,   Issues with teeth  Still having to eat soft foods; due to abscess tooth Face was very swollen on the right   Exercise;   Weight training qod; with dumbbells Exercise bike Hip and leg exercises  Walk when they can 60 x 5 days a week   Safety features reviewed for safe community;  Name is on a waiting list for [place] home [place] he is an only child and wife only has one brother  firearms if in the home; don't have any  smoke alarms; yes sun protection when outside; yes; has dermatology visit regularly  driving difficulties or accidents; no   Advanced Directive to bring to the office   Hearing Screening Comments: Has had a hearing screen Dr. Elwyn Reach in  [place] Wasn't pleased with hearing test; recommended hearing aids;  Were not clear about fees;   Vision Screening Comments: Vision exams once a year Dr. Katy Fitch, [place] Gave No issues     HOME SAFETY;  Fall hx; no Fall risk: Fear of falling? no Given education on "Fall Prevention in the Home" for more safety tips the patient can apply as appropriate.  Long term goal is to "age in place"    Mental Health:  Any emotional problems? Anxious, depressed, irritable, sad or blue? no Denies feeling depressed or hopeless; voices pleasure in daily life How many social activities have you been engaged in within the last 2 weeks? no    Cognitive;  Manages checkbook, medications; no failures of task Ad8 score reviewed for issues;  Issues making decisions; no  Less interest in hobbies / activities" no  Repeats questions, stories; family complaining: NO  Trouble using ordinary gadgets; microwave; computer: no  Forgets the month or year: no  Mismanaging finances: no  Missing apt: no but does write them down  Daily problems with thinking of memory NO Ad8 score is 0   Any dizziness when standing up?   Mobilization and Functional losses from last year to this year?  No   Sleep pattern changes; no   Advanced Directive addressed; Completed or educated    Health Maintenance Colonoscopy; 11/2013 - 11/2018  Prostate cancer screening: WNL Tdap  is due this year - states he is on an antibiotic which told him not to take any vaccinations unless the doctor tells him too.   Immunizations Due: (Vaccines reviewed and educated regarding any overdue)    Current Care Team reviewed and updated   Education provided and lifestyle risk discussed   All Health Maintenance Gaps Reviewed for closure   Cardiac Risk Factors include: advanced age (>54men, >56 women);male gender    Objective:    Vitals: BP 124/70   Pulse 73   Ht 5\' 11"  (1.803 m)   Wt 145 lb (65.8 kg)   SpO2 98%   BMI 20.22  kg/m   Body mass index is 20.22 kg/m.  Tobacco History  Smoking Status  . Never Smoker  Smokeless Tobacco  . Never Used     Counseling given: Yes   Past Medical History:  Diagnosis Date  . Colon polyps    Sessile Serrated adenomas  . DYSPNEA 02/09/2009  . RESTLESS LEGS SYNDROME 02/28/2009  . SEIZURE DISORDER 02/09/2009   last seisure Sep 10, 2001   Past Surgical History:  Procedure Laterality Date  . APPENDECTOMY  06/01/11  . HERNIA REPAIR Right 02/11/09   dr. 02/13/09 -[facility]  . LAPAROSCOPIC APPENDECTOMY  06/01/2011   Procedure: APPENDECTOMY LAPAROSCOPIC;  Surgeon: 06/03/2011, MD;  Location: [facility];  Service: General;  Laterality: N/A;   Family History  Problem Relation Age of Onset  . Cancer Mother   . Cancer Father        multiple myeloma  . Diabetes Mellitus II Father   . Stroke Neg Hx        grandparent  . Colon cancer Neg Hx   . Esophageal cancer Neg Hx   . Rectal cancer Neg Hx   . Stomach cancer Neg Hx    History  Sexual Activity  . Sexual activity: Not on file    Outpatient Encounter Prescriptions as of 11/23/2016  Medication Sig  . Calcium-Magnesium-Zinc 167-83-8 MG TABS Take 3 tablets by mouth daily.  . cholecalciferol (VITAMIN D) 1000 UNITS tablet Take 1,000 Units by mouth every Monday, Wednesday, and Friday.   . Cod Liver Oil OIL Take 5 mLs by mouth daily.    . Coenzyme Q10 (CO Q 10) 60 MG CAPS Take 1 tablet by mouth daily.    . cyanocobalamin 500 MCG tablet Take 1,000 mcg by mouth daily.  . Homeopathic Products (SIMILASAN DRY EYE RELIEF) SOLN Apply 1 drop to eye daily as needed. Dry Eyes   . PHENObarbital (LUMINAL) 60 MG tablet Take 1 tablet (60 mg total) by mouth at bedtime.  . Probiotic Product (PROBIOTIC DAILY PO) Take by mouth daily.  . vitamin C (ASCORBIC ACID) 500 MG tablet Take 500 mg by mouth 3 (three) times daily.    . hyoscyamine (LEVSIN/SL) 0.125 MG SL tablet Place 1 tablet (0.125 mg total) under the tongue every 4 (four) hours as  needed. (Patient not taking: Reported on 11/23/2016)   No facility-administered encounter medications on file as of 11/23/2016.     Activities of Daily Living In your present state of health, do you have any difficulty performing the following activities: 11/23/2016  Hearing? Y  Vision? Y  Difficulty concentrating or making decisions? N  Walking or climbing stairs? N  Dressing or bathing? N  Doing errands, shopping? N  Preparing Food and eating ? N  Using the Toilet? N  In the past six months, have you accidently leaked urine? N  Do  you have problems with loss of bowel control? N  Managing your Medications? N  Managing your Finances? N  Housekeeping or managing your Housekeeping? N  Some recent data might be hidden    Patient Care Team: [name], MD as PCP - General   Assessment:    ot  Exercise Activities and Dietary recommendations Current Exercise Habits: Home exercise routine, Type of exercise: strength training/weights;walking, Time (Minutes): 60, Frequency (Times/Week): 5, Weekly Exercise (Minutes/Week): 300, Intensity: Moderate  Goals    . patient          To continue to eat well and exercise       Fall Risk Fall Risk  11/23/2016 01/06/2016 08/19/2014 01/22/2013 01/22/2013  Falls in the past year? No No No No No   Depression Screen PHQ 2/9 Scores 11/23/2016 08/19/2014 01/22/2013 01/22/2013  PHQ - 2 Score 0 0 0 0    Cognitive Function MMSE - Mini Mental State Exam 11/23/2016  Not completed: (No Data)        Immunization History  Administered Date(s) Administered  . Influenza Split 04/26/2011, 04/30/2012  . Influenza Whole 05/05/2010  . Influenza, High Dose Seasonal PF 05/12/2015, 04/13/2016  . Influenza,inj,Quad PF,36+ Mos 05/07/2013, 05/05/2014  . Pneumococcal Conjugate-13 01/18/2015  . Pneumococcal Polysaccharide-23 01/22/2013  . Td 01/28/2007  . Zoster 02/05/2011   Screening Tests Health Maintenance  Topic Date Due  . Hepatitis C Screening   02/21/2017 (Originally [birth year])  . TETANUS/TDAP  01/27/2017  . INFLUENZA VACCINE  02/13/2017  . COLONOSCOPY  11/25/2018  . PNA vac Low Risk Adult  Completed      Plan:     I have personally reviewed and noted the following in the patient's chart:   . Medical and social history . Use of alcohol, tobacco or illicit drugs  . Current medications and supplements . Functional ability and status . Nutritional status . Physical activity . Advanced directives . List of other physicians . Hospitalizations, surgeries, and ER visits in previous 12 months . Vitals . Screenings to include cognitive, depression, and falls . Referrals and appointments  In addition, I have reviewed and discussed with patient certain preventive protocols, quality metrics, and best practice recommendations. A written personalized care plan for preventive services as well as general preventive health recommendations were provided to patient.     VNRWC,HJSCB, RN  11/23/2016  Agree with assessment as above.  [name] MD  Primary Care at [facility]

## 2016-11-24 LAB — HEPATITIS C ANTIBODY: HCV AB: NEGATIVE

## 2016-11-27 ENCOUNTER — Ambulatory Visit: Payer: Medicare Other

## 2016-11-29 ENCOUNTER — Ambulatory Visit (INDEPENDENT_AMBULATORY_CARE_PROVIDER_SITE_OTHER)
Admission: RE | Admit: 2016-11-29 | Discharge: 2016-11-29 | Disposition: A | Discharge status: Home / Self Care | Payer: Medicare Other | Source: Ambulatory Visit

## 2016-11-29 DIAGNOSIS — Z0000 Encounter for general adult medical examination without abnormal findings: Secondary | ICD-10-CM

## 2016-11-29 DIAGNOSIS — Z79890 Hormone replacement therapy (postmenopausal): Secondary | ICD-10-CM | POA: Diagnosis not present

## 2016-11-29 DIAGNOSIS — Z13820 Encounter for screening for osteoporosis: Secondary | ICD-10-CM | POA: Diagnosis not present

## 2016-12-07 ENCOUNTER — Encounter: Payer: Self-pay | Admitting: Family Medicine

## 2016-12-07 ENCOUNTER — Ambulatory Visit (INDEPENDENT_AMBULATORY_CARE_PROVIDER_SITE_OTHER): Payer: Medicare Other | Admitting: Family Medicine

## 2016-12-07 DIAGNOSIS — T4275XA Adverse effect of unspecified antiepileptic and sedative-hypnotic drugs, initial encounter: Secondary | ICD-10-CM | POA: Diagnosis not present

## 2016-12-07 DIAGNOSIS — M810 Age-related osteoporosis without current pathological fracture: Secondary | ICD-10-CM | POA: Insufficient documentation

## 2016-12-07 DIAGNOSIS — M818 Other osteoporosis without current pathological fracture: Secondary | ICD-10-CM

## 2016-12-07 LAB — VITAMIN D 25 HYDROXY (VIT D DEFICIENCY, FRACTURES): VITD: 24.15 ng/mL — AB (ref 30.00–100.00)

## 2016-12-07 NOTE — Progress Notes (Signed)
Subjective:     Patient ID: Nathan [name], male   DOB: 1945-07-19, 71 y.o.   MRN: [id number]  HPI Patient here for follow-up to discuss recent abnormal bone density scan. He has history of chronic phenobarbital use. DEXA scan showed T score lumbar spine -3.2 and right femur -3.1 and left femur -3.2. No history of fracture. He takes calcium and vitamin D regularly and does regular weightbearing exercise. No history of alcohol use. Nonsmoker. No excessive caffeine use. No history of chronic prednisone use.  No recent falls and no fracture hx.    Past Medical History:  Diagnosis Date  . Colon polyps    Sessile Serrated adenomas  . DYSPNEA 02/09/2009  . RESTLESS LEGS SYNDROME 02/28/2009  . SEIZURE DISORDER 02/09/2009   last seisure Sep 10, 2001   Past Surgical History:  Procedure Laterality Date  . APPENDECTOMY  06/01/11  . HERNIA REPAIR Right 02/11/09   dr. Donella Stade -[facility]  . LAPAROSCOPIC APPENDECTOMY  06/01/2011   Procedure: APPENDECTOMY LAPAROSCOPIC;  Surgeon: Shann Medal, MD;  Location: [facility];  Service: General;  Laterality: N/A;    reports that he has never smoked. He has never used smokeless tobacco. He reports that he does not drink alcohol or use drugs. family history includes Cancer in his father and mother; Diabetes Mellitus II in his father. Allergies  Allergen Reactions  . Carbamazepine     REACTION: fatigue, sleepy  . Ciprofloxacin     Per pt, "thinks caused rash"  . Clindamycin/Lincomycin Diarrhea  . Metronidazole     REACTION: GI upset  . Penicillins     REACTION: childhood, hives?  . Phenytoin     REACTION: rash, leg swelling  . Sulfamethoxazole Other (See Comments)  . Trimethoprim Other (See Comments)  . Bactrim Other (See Comments)    Caused headaches, that progressively worsened when on medication. Once off, they went away.  . Phenytoin Sodium Extended Swelling and Rash    Swelling of ankles. Happened years ago per patient.     Review of Systems   Constitutional: Negative for appetite change and unexpected weight change.  Respiratory: Negative for cough and shortness of breath.   Cardiovascular: Negative for chest pain.  Musculoskeletal: Negative for arthralgias, back pain and neck pain.  Neurological: Negative for dizziness, seizures, syncope, weakness and light-headedness.  Hematological: Negative for adenopathy.       Objective:   Physical Exam  Constitutional: He appears well-developed and well-nourished.  Neck: Neck supple.  Cardiovascular: Normal rate and regular rhythm.   Pulmonary/Chest: Effort normal and breath sounds normal. No respiratory distress. He has no wheezes. He has no rales.  Musculoskeletal: He exhibits no edema.  Lymphadenopathy:    He has no cervical adenopathy.       Assessment:     Osteoporosis by recent DEXA scan with risk factor of chronic anticonvulsant use    Plan:     -We discussed our recommendation to consider oral bisphosphonates but patient is opposed to the idea of any prescription medication this point. He realizes that failure to treat can result in increased risk of short-term and long-term fracture -Recommend daily calcium thousand milligrams and vitamin D 1,000 international units -Check 25-hydroxy vitamin D level -Patient plans to use over-the-counter product called Algaecal.  We explained this is non-FDA regulated and we could not support use above standard therapies such as bisphosphonates -Consider repeat DEXA scan in one year  [name] MD [place] [facility]

## 2016-12-07 NOTE — Patient Instructions (Signed)

## 2016-12-12 ENCOUNTER — Other Ambulatory Visit: Payer: Self-pay

## 2016-12-12 DIAGNOSIS — E559 Vitamin D deficiency, unspecified: Secondary | ICD-10-CM

## 2016-12-14 ENCOUNTER — Other Ambulatory Visit (INDEPENDENT_AMBULATORY_CARE_PROVIDER_SITE_OTHER): Payer: Medicare Other

## 2016-12-14 DIAGNOSIS — Z86010 Personal history of colonic polyps: Secondary | ICD-10-CM

## 2016-12-14 LAB — POC HEMOCCULT BLD/STL (HOME/3-CARD/SCREEN)
Card #2 Fecal Occult Blod, POC: NEGATIVE
FECAL OCCULT BLD: NEGATIVE
FECAL OCCULT BLD: NEGATIVE

## 2016-12-14 LAB — FECAL OCCULT BLOOD, GUAIAC: FECAL OCCULT BLD: NEGATIVE

## 2016-12-17 ENCOUNTER — Encounter: Payer: Self-pay | Admitting: Family Medicine

## 2017-01-07 ENCOUNTER — Encounter: Payer: Self-pay | Admitting: Neurology

## 2017-01-07 ENCOUNTER — Other Ambulatory Visit (INDEPENDENT_AMBULATORY_CARE_PROVIDER_SITE_OTHER): Payer: Medicare Other

## 2017-01-07 ENCOUNTER — Ambulatory Visit (INDEPENDENT_AMBULATORY_CARE_PROVIDER_SITE_OTHER): Payer: Medicare Other | Admitting: Neurology

## 2017-01-07 VITALS — BP 120/68 | HR 80 | Ht 71.0 in | Wt 147.3 lb

## 2017-01-07 DIAGNOSIS — G609 Hereditary and idiopathic neuropathy, unspecified: Secondary | ICD-10-CM

## 2017-01-07 DIAGNOSIS — G2581 Restless legs syndrome: Secondary | ICD-10-CM

## 2017-01-07 DIAGNOSIS — F518 Other sleep disorders not due to a substance or known physiological condition: Secondary | ICD-10-CM

## 2017-01-07 DIAGNOSIS — G40909 Epilepsy, unspecified, not intractable, without status epilepticus: Secondary | ICD-10-CM | POA: Diagnosis not present

## 2017-01-07 LAB — FERRITIN: FERRITIN: 113.2 ng/mL (ref 22.0–322.0)

## 2017-01-07 LAB — VITAMIN B12: Vitamin B-12: 1050 pg/mL — ABNORMAL HIGH (ref 211–911)

## 2017-01-07 NOTE — Patient Instructions (Addendum)
Check labs  Return to clinic 1 year

## 2017-01-07 NOTE — Progress Notes (Signed)
  Follow-up Visit   Date: 01/07/17    Nathan [name] MRN: [id number] DOB: 06/25/1946   Interim History: Nathan [name] is a 70 y.o. right-handed Caucasian with seizure disorder (maintained on PHB, seizure free since 2003) and restless leg syndrome returning to the clinic for follow-up of neuropathy.  The patient was accompanied to the clinic by wife who also provides collateral information.    History of present illness: Starting around 2014, patient developed numbness/tingling of the soles of his feet which was constant. He would wear compression stockings for varicose veins and noticed the numbness when he would take them off in the evening and when he would shower. Six months prior to symptom onset, he was getting varicose vein injections throughout his lower extremities, more on the right. His last treatment was in October 2014. In April 2015, his numbness suddenly worsened to involve his lower legs and knees, worse on the right. Over the past few months, his leg numbness has improved some. There is no associated weakness, low back pain, or bowel/bladder dysfunction. He endorses mild problems with balance and has been doing home balance exercises. He is on a very strict low-sugar, low-salt, vegetarian diet and reports that since taking vitamin B12 oral supplements, symptoms may have improved. Recent blood working including vitamin B12 level is normal.  Patient was seen at [facility] by Dr. Penumalli in September 2015 for bilateral feet numbness. EMG of the leg showed lumbar radiculopathy so he ordered MRI lumbar spine which showed multilevel degenerative changes, without foraminal stenosis. Patient sought the opinion of Dr. Stern for his lumbar radiculopathy who did not feel that symptoms were due to lumbar disease, and instead was concerning about neuropathy, so referred him for second opinion.   Of note, he also has a history of seizure disorder which started at age 47 years old. He has  had 3 grand mal seizures, all occurring at nighttime during sleep, with the first occuring in 1994. He was first placed on Dilantin, but due to ankle swelling he was switched to tegretol, but this caused him to be sleepy. He was transitioned to phenobarbital 30 mg which controlled his seizures. He had a second seizure in 1997 but then was seizure free until February 2003 during a period when he was taken off phenobarbital for 3-4 months prior, due to long period of seizure freedom. After his last breakthrough seizure he has been on phenobarbital 60 mg at night.  UPDATE 01/06/2015:  He has remained relatively stable over the past year, without worsening paresthesias.  He continues numbness over the lower legs.  There is no painful paresthesias.  He has mild imbalance and has not suffered any falls.  No interval hospitalization, illness, or seizures.    UPDATE 01/07/2017:  He is here for routine follow-up.  He has been doing well and denies any new seizures, falls, or worsening neuropathy.  He complains of increased sedation and restless sensation of his legs.  He does not take anything for RLS.   He tells me that he was doing well on PHB 30mg for the longest time and because of insomnia, his dose was increased to 60mg daily and is interested in lowering the dose.  He was recently found to have osteoporosis and has started algea-cal.  He also complains of sporadic and infrequent limb jerking during drowsiness.  He was alarmed because his arm involuntarily hit his face.  He has noticed discoloration of his fingers during cold temperatures where it will   turn purple and white, there is no pain.    Medications:  Current Outpatient Prescriptions on File Prior to Visit  Medication Sig Dispense Refill  . Calcium-Magnesium-Zinc 167-83-8 MG TABS Take 3 tablets by mouth daily.    . cholecalciferol (VITAMIN D) 1000 UNITS tablet Take 1,000 Units by mouth every Monday, Wednesday, and Friday.     . Cod Liver Oil OIL Take  5 mLs by mouth daily.      . Coenzyme Q10 (CO Q 10) 60 MG CAPS Take 1 tablet by mouth daily.      . cyanocobalamin 500 MCG tablet Take 1,000 mcg by mouth daily.    . Homeopathic Products (SIMILASAN DRY EYE RELIEF) SOLN Apply 1 drop to eye daily as needed. Dry Eyes     . hyoscyamine (LEVSIN/SL) 0.125 MG SL tablet Place 1 tablet (0.125 mg total) under the tongue every 4 (four) hours as needed. 30 tablet 1  . PHENObarbital (LUMINAL) 60 MG tablet Take 1 tablet (60 mg total) by mouth at bedtime. 90 tablet 1  . Probiotic Product (PROBIOTIC DAILY PO) Take by mouth daily.    . vitamin C (ASCORBIC ACID) 500 MG tablet Take 500 mg by mouth 3 (three) times daily.       No current facility-administered medications on file prior to visit.     Allergies:  Allergies  Allergen Reactions  . Carbamazepine     REACTION: fatigue, sleepy  . Ciprofloxacin     Per pt, "thinks caused rash"  . Clindamycin/Lincomycin Diarrhea  . Metronidazole     REACTION: GI upset  . Penicillins     REACTION: childhood, hives?  . Phenytoin     REACTION: rash, leg swelling  . Sulfamethoxazole Other (See Comments)  . Trimethoprim Other (See Comments)  . Bactrim Other (See Comments)    Caused headaches, that progressively worsened when on medication. Once off, they went away.  . Phenytoin Sodium Extended Swelling and Rash    Swelling of ankles. Happened years ago per patient.    Review of Systems:  CONSTITUTIONAL: No fevers, chills, night sweats, or weight loss.  EYES: No visual changes or eye pain ENT: No hearing changes.  No history of nose bleeds.   RESPIRATORY: No cough, wheezing and shortness of breath.   CARDIOVASCULAR: Negative for chest pain, and palpitations.   GI: Negative for abdominal discomfort, blood in stools or black stools.  No recent change in bowel habits.   GU:  No history of incontinence.   MUSCLOSKELETAL: No history of joint pain or swelling.  No myalgias.   SKIN: Negative for lesions, rash, and  itching.   ENDOCRINE: Negative for cold or heat intolerance, polydipsia or goiter.   PSYCH:  No depression or anxiety symptoms.   NEURO: As Above.   Vital Signs:  BP 120/68   Pulse 80   Ht 5' 11" (1.803 m)   Wt 147 lb 5 oz (66.8 kg)   SpO2 98%   BMI 20.55 kg/m   Neurological Exam: MENTAL STATUS including orientation to time, place, person, recent and remote memory, attention span and concentration, language, and fund of knowledge is normal.  Speech is not dysarthric.  CRANIAL NERVES: No visual field defects.  Pupils equal round and reactive to light.  Normal conjugate, extra-ocular eye movements in all directions of gaze.  Face is symmetric. Palate elevates symmetrically.  Tongue is midline.  MOTOR:  Motor strength is 5/5 in all extremities, except toe flexion and extension is 5-/5.  No  atrophy, fasciculations or abnormal movements.  No pronator drift.  Tone is normal.    MSRs:  Reflexes are 2+/4 throughout, except absent Achilles.  Negative Rhomberg.  SENSORY:  Reduced vibration distal to ankles (trace) and absent at the great toe bilaterally.   COORDINATION/GAIT:  Intact rapid alternating movements bilaterally.  Gait narrow based and stable.  Very mild unsteadiness with tandem gait.   Data: EMG performed by Dr. Leta Baptist at [facility] 04/21/2014: Abnormal study demonstrating: 1. Evidence of bilateral lumbar radiculopathies. 2. No evidence of underlying widespread polyneuropathy.  MRI lumbar spine 05/11/2014: 1. Mild multilevel lumbar disc degeneration and facet arthrosis resulting in mild bilateral lateral recess stenosis at L3-4 and L4-5 and moderate left neural foraminal stenosis at L3-4. No spinal canal stenosis. 2. Diffuse bone marrow heterogeneity, nonspecific. Considerations include anemia, smoking, and infiltrative processes (including multiple myeloma).  Labs 09/15/2014:  Vitamin B6 15.5, vitamin B1 14, copper 80, zinc 62 Lab Results  Component Value Date   TSH 0.70  11/23/2016   Lab Results  Component Value Date   VITAMINB12 678 08/19/2014     IMPRESSION/PLAN: 1.  Idiopathic distal and symmetric peripheral neuropathy, stable  - Predominately numbness without associated pain  - Continue home balance exercises   - Check B12   2.  Grand mal seizures, last seizure in 2003.    - Stable on phenobarbitol 45m daily.  Consider tapering this due to increased sedation.  He reports seizures were well controlled on 391m but it was increased due to insomnia  - Check PHB level  3.  RLS, pharmacological treatment declined  - Check ferritin  4.  Hypnic jerks  5.  Osteoporosis due to side effect of BPH, appreciate PCP management  6.  Likely Raynaud's phenomenon, follow-up with PCP  Return to clinic in 1 year   The duration of this appointment visit was 30 minutes of face-to-face time with the patient.  Greater than 50% of this time was spent in counseling, explanation of diagnosis, planning of further management, and coordination of care.   Thank you for allowing me to participate in patient's care.  If I can answer any additional questions, I would be pleased to do so.    Sincerely,    Leeah Politano K. PaPosey ProntoDO

## 2017-01-08 LAB — PHENOBARBITAL LEVEL: Phenobarbital: 8 mg/L — ABNORMAL LOW (ref 15.0–40.0)

## 2017-01-09 ENCOUNTER — Telehealth: Payer: Self-pay | Admitting: *Deleted

## 2017-01-09 NOTE — Telephone Encounter (Signed)
Patient given results and instructions and agrees with plan.

## 2017-01-09 NOTE — Telephone Encounter (Signed)
-----   Message from Alda Berthold, DO sent at 01/09/2017  1:33 PM EDT ----- Please inform patient that his labs are within normal limits.  His phenobarbital level continues to be low.  I would recommend that he reduce the dose of phenobarbital to 45mg  (take 1.5 tab) for one month, then reduce to 30mg  (1 tab) daily.  Thanks.

## 2017-01-23 ENCOUNTER — Encounter: Payer: Self-pay | Admitting: Neurology

## 2017-01-25 ENCOUNTER — Encounter: Payer: Self-pay | Admitting: Family Medicine

## 2017-02-05 ENCOUNTER — Ambulatory Visit (INDEPENDENT_AMBULATORY_CARE_PROVIDER_SITE_OTHER): Payer: Medicare Other | Admitting: Family Medicine

## 2017-02-05 ENCOUNTER — Encounter: Payer: Self-pay | Admitting: Family Medicine

## 2017-02-05 VITALS — BP 102/62 | HR 93 | Temp 98.4°F | Wt 150.5 lb

## 2017-02-05 DIAGNOSIS — R300 Dysuria: Secondary | ICD-10-CM

## 2017-02-05 LAB — POCT URINALYSIS DIPSTICK
Bilirubin, UA: NEGATIVE
GLUCOSE UA: NEGATIVE
Ketones, UA: NEGATIVE
LEUKOCYTES UA: NEGATIVE
NITRITE UA: NEGATIVE
Protein, UA: NEGATIVE
RBC UA: NEGATIVE
Spec Grav, UA: 1.015 (ref 1.010–1.025)
UROBILINOGEN UA: 0.2 U/dL
pH, UA: 7 (ref 5.0–8.0)

## 2017-02-05 NOTE — Progress Notes (Signed)
Subjective:     Patient ID: Nathan [name], male   DOB: [birth year], 71 y.o.   MRN: [id number]  HPI Pt seen with some recent mild discomfort with urination.  Symptoms somewhat inconsistent.  No fever or chills.  No flank pain.  No hx of UTI.  He has some nocturia and occasional slow stream but feels he is emptying bladder OK.  Recent PSA < 1. No nausea or vomiting.  Past Medical History:  Diagnosis Date  . Colon polyps    Sessile Serrated adenomas  . DYSPNEA 02/09/2009  . RESTLESS LEGS SYNDROME 02/28/2009  . SEIZURE DISORDER 02/09/2009   last seisure Sep 10, 2001   Past Surgical History:  Procedure Laterality Date  . APPENDECTOMY  06/01/11  . HERNIA REPAIR Right 02/11/09   dr. Donella Stade -[facility]  . LAPAROSCOPIC APPENDECTOMY  06/01/2011   Procedure: APPENDECTOMY LAPAROSCOPIC;  Surgeon: Shann Medal, MD;  Location: [facility];  Service: General;  Laterality: N/A;    reports that he has never smoked. He has never used smokeless tobacco. He reports that he does not drink alcohol or use drugs. family history includes Cancer in his father and mother; Diabetes Mellitus II in his father. Allergies  Allergen Reactions  . Carbamazepine     REACTION: fatigue, sleepy  . Ciprofloxacin     Per pt, "thinks caused rash"  . Clindamycin/Lincomycin Diarrhea  . Metronidazole     REACTION: GI upset  . Penicillins     REACTION: childhood, hives?  . Phenytoin     REACTION: rash, leg swelling  . Sulfamethoxazole Other (See Comments)  . Trimethoprim Other (See Comments)  . Bactrim Other (See Comments)    Caused headaches, that progressively worsened when on medication. Once off, they went away.  . Phenytoin Sodium Extended Swelling and Rash    Swelling of ankles. Happened years ago per patient.     Review of Systems  Constitutional: Negative for chills and fever.  Gastrointestinal: Negative for nausea and vomiting.  Genitourinary: Positive for dysuria. Negative for flank pain and hematuria.   Musculoskeletal: Negative for back pain.  Neurological: Negative for weakness.       Objective:   Physical Exam  Constitutional: He appears well-developed and well-nourished.  Cardiovascular: Normal rate and regular rhythm.   Pulmonary/Chest: Effort normal and breath sounds normal. No respiratory distress. He has no wheezes. He has no rales.  Genitourinary:  Genitourinary Comments: Prostate not enlarged.  Non-tender.   No rectal mass.       Assessment:     Dysuria.  Urine dip completely normal.  No evidence for acute prostatitis.  May have some mild BPH.    Plan:     -no indications for antibiotics at this time - discussed medications such as Flomax and at this time he does not feel severity of symptoms warrants.  [name] MD [facility] at [facility]

## 2017-02-05 NOTE — Patient Instructions (Signed)
Stay well hydrated Follow up for any fever or progressive urinary symptoms.

## 2017-03-13 ENCOUNTER — Encounter: Payer: Self-pay | Admitting: Family Medicine

## 2017-03-13 MED ORDER — PHENOBARBITAL 60 MG PO TABS: 60.0000 mg | ORAL_TABLET | Freq: Every day | ORAL | 1 refills | Status: DC

## 2017-04-04 ENCOUNTER — Encounter: Payer: Self-pay | Admitting: Family Medicine

## 2017-04-09 ENCOUNTER — Other Ambulatory Visit (INDEPENDENT_AMBULATORY_CARE_PROVIDER_SITE_OTHER): Payer: Medicare Other

## 2017-04-09 DIAGNOSIS — E559 Vitamin D deficiency, unspecified: Secondary | ICD-10-CM | POA: Diagnosis not present

## 2017-04-09 LAB — VITAMIN D 25 HYDROXY (VIT D DEFICIENCY, FRACTURES): VITD: 33.17 ng/mL (ref 30.00–100.00)

## 2017-05-02 ENCOUNTER — Ambulatory Visit (INDEPENDENT_AMBULATORY_CARE_PROVIDER_SITE_OTHER): Payer: Medicare Other | Admitting: *Deleted

## 2017-05-02 DIAGNOSIS — Z23 Encounter for immunization: Secondary | ICD-10-CM | POA: Diagnosis not present

## 2017-08-24 ENCOUNTER — Other Ambulatory Visit: Payer: Self-pay | Admitting: Family Medicine

## 2017-08-26 NOTE — Telephone Encounter (Signed)
Dr Posey Pronto (neurologist) had changed his dosage and would need to confirm what his current dosages. Would probably defer to them for refills if he will be seeing them back for follow-up

## 2017-08-26 NOTE — Telephone Encounter (Signed)
Last refill 03/13/17 and last office visit 02/05/17.  Okay to fill?

## 2017-08-28 ENCOUNTER — Encounter: Payer: Self-pay | Admitting: Family Medicine

## 2017-08-29 ENCOUNTER — Other Ambulatory Visit: Payer: Self-pay | Admitting: *Deleted

## 2017-08-29 MED ORDER — PHENOBARBITAL 60 MG PO TABS: 60.0000 mg | ORAL_TABLET | Freq: Every day | ORAL | 0 refills | Status: DC

## 2017-08-29 NOTE — Telephone Encounter (Signed)
Rx done and the pt was informed via Mychart message. 

## 2017-11-25 ENCOUNTER — Ambulatory Visit (INDEPENDENT_AMBULATORY_CARE_PROVIDER_SITE_OTHER): Payer: Medicare Other | Admitting: Family Medicine

## 2017-11-25 ENCOUNTER — Encounter: Payer: Self-pay | Admitting: Family Medicine

## 2017-11-25 VITALS — BP 110/70 | HR 53 | Temp 97.6°F | Ht 71.0 in | Wt 147.5 lb

## 2017-11-25 DIAGNOSIS — Z79899 Other long term (current) drug therapy: Secondary | ICD-10-CM

## 2017-11-25 DIAGNOSIS — M818 Other osteoporosis without current pathological fracture: Secondary | ICD-10-CM

## 2017-11-25 DIAGNOSIS — Z0000 Encounter for general adult medical examination without abnormal findings: Secondary | ICD-10-CM

## 2017-11-25 LAB — LIPID PANEL
CHOL/HDL RATIO: 2
Cholesterol: 144 mg/dL (ref 0–200)
HDL: 80.7 mg/dL (ref >39.00)
LDL Cholesterol: 55 mg/dL (ref 0–99)
NONHDL: 62.95
Triglycerides: 40 mg/dL (ref 0.0–149.0)
VLDL: 8 mg/dL (ref 0.0–40.0)

## 2017-11-25 LAB — CBC WITH DIFFERENTIAL/PLATELET
BASOS ABS: 0 10*3/uL (ref 0.0–0.1)
Basophils Relative: 1.3 % (ref 0.0–3.0)
EOS PCT: 2.3 % (ref 0.0–5.0)
Eosinophils Absolute: 0.1 10*3/uL (ref 0.0–0.7)
HEMATOCRIT: 39.9 % (ref 39.0–52.0)
Hemoglobin: 14 g/dL (ref 13.0–17.0)
LYMPHS PCT: 36.3 % (ref 12.0–46.0)
Lymphs Abs: 0.9 10*3/uL (ref 0.7–4.0)
MCHC: 35.1 g/dL (ref 30.0–36.0)
MCV: 95.2 fl (ref 78.0–100.0)
Monocytes Absolute: 0.5 10*3/uL (ref 0.1–1.0)
Neutro Abs: 1 10*3/uL — ABNORMAL LOW (ref 1.4–7.7)
Neutrophils Relative %: 41.4 % — ABNORMAL LOW (ref 43.0–77.0)
Platelets: 156 10*3/uL (ref 150.0–400.0)
RBC: 4.19 Mil/uL — AB (ref 4.22–5.81)
RDW: 13.1 % (ref 11.5–15.5)
WBC: 2.5 10*3/uL — ABNORMAL LOW (ref 4.0–10.5)

## 2017-11-25 LAB — HEPATIC FUNCTION PANEL
ALK PHOS: 90 U/L (ref 39–117)
ALT: 25 U/L (ref 0–53)
AST: 21 U/L (ref 0–37)
Albumin: 4.3 g/dL (ref 3.5–5.2)
BILIRUBIN DIRECT: 0.2 mg/dL (ref 0.0–0.3)
TOTAL PROTEIN: 6.2 g/dL (ref 6.0–8.3)
Total Bilirubin: 0.6 mg/dL (ref 0.2–1.2)

## 2017-11-25 LAB — BASIC METABOLIC PANEL
BUN: 14 mg/dL (ref 6–23)
CALCIUM: 9.2 mg/dL (ref 8.4–10.5)
CO2: 35 meq/L — AB (ref 19–32)
CREATININE: 0.66 mg/dL (ref 0.40–1.50)
Chloride: 101 mEq/L (ref 96–112)
GFR: 126.53 mL/min (ref >60.00)
Glucose, Bld: 95 mg/dL (ref 70–99)
Potassium: 4.3 mEq/L (ref 3.5–5.1)
SODIUM: 141 meq/L (ref 135–145)

## 2017-11-25 LAB — TSH: TSH: 0.89 u[IU]/mL (ref 0.35–4.50)

## 2017-11-25 LAB — PSA: PSA: 0.72 ng/mL (ref 0.10–4.00)

## 2017-11-25 LAB — VITAMIN D 25 HYDROXY (VIT D DEFICIENCY, FRACTURES): VITD: 35.33 ng/mL (ref 30.00–100.00)

## 2017-11-25 MED ORDER — PHENOBARBITAL 60 MG PO TABS: 60.0000 mg | ORAL_TABLET | Freq: Every day | ORAL | 3 refills | Status: DC

## 2017-11-25 NOTE — Progress Notes (Signed)
Subjective:     Patient ID: Nathan [name], male   DOB: [birth year], 72 y.o.   MRN: [id number]  HPI Patient seen for physical exam. His chronic problems include history of restless leg syndrome, history of seizures, osteoporosis. He's been for several years on Luminal.  has not had a seizure in many years. He has been reluctant to reduce medication for fear of seizure. He has discussed with neurologist the past possibility of gradually reducing his dosage.  He had osteoporosis by DEXA scan last year and this probably related to his chronic antiseizure medication history. We recommend he consider medications such as bisphosphonate be he declined and he decided instead to use product called Algae Cal.  We discussed getting follow-up DEXA scan this year. No recent falls. No recent fracture.  Patient has never smoked. No alcohol use. Retired. 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 Exercises regularly. He and his wife just recently moved to retirement center-currently independent living  Past Medical History:  Diagnosis Date  . Colon polyps    Sessile Serrated adenomas  . DYSPNEA 02/09/2009  . RESTLESS LEGS SYNDROME 02/28/2009  . SEIZURE DISORDER 02/09/2009   last seisure Sep 10, 2001   Past Surgical History:  Procedure Laterality Date  . APPENDECTOMY  06/01/11  . HERNIA REPAIR Right 02/11/09   dr. Donella Stade -[facility]  . LAPAROSCOPIC APPENDECTOMY  06/01/2011   Procedure: APPENDECTOMY LAPAROSCOPIC;  Surgeon: Shann Medal, MD;  Location: [facility];  Service: General;  Laterality: N/A;    reports that he has never smoked. He has never used smokeless tobacco. He reports that he does not drink alcohol or use drugs. family history includes Cancer in his father and mother; Diabetes Mellitus II in his father. Allergies  Allergen Reactions  . Carbamazepine     REACTION: fatigue, sleepy  . Ciprofloxacin     Per pt, "thinks caused rash"  . Clindamycin/Lincomycin Diarrhea  .  Metronidazole     REACTION: GI upset  . Penicillins     REACTION: childhood, hives?  . Phenytoin     REACTION: rash, leg swelling  . Sulfamethoxazole Other (See Comments)  . Trimethoprim Other (See Comments)  . Bactrim Other (See Comments)    Caused headaches, that progressively worsened when on medication. Once off, they went away.  . Phenytoin Sodium Extended Swelling and Rash    Swelling of ankles. Happened years ago per patient.     Review of Systems  Constitutional: Negative for activity change, appetite change, fatigue and fever.  HENT: Negative for congestion, ear pain and trouble swallowing.   Eyes: Negative for pain and visual disturbance.  Respiratory: Negative for cough, shortness of breath and wheezing.   Cardiovascular: Negative for chest pain and palpitations.  Gastrointestinal: Negative for abdominal distention, abdominal pain, blood in stool, constipation, diarrhea, nausea, rectal pain and vomiting.  Genitourinary: Negative for dysuria, hematuria and testicular pain.  Musculoskeletal: Negative for arthralgias and joint swelling.  Skin: Negative for rash.  Neurological: Negative for dizziness, syncope and headaches.  Hematological: Negative for adenopathy.  Psychiatric/Behavioral: Negative for confusion and dysphoric mood.       Objective:   Physical Exam  Constitutional: He is oriented to person, place, and time. He appears well-developed and well-nourished. No distress.  HENT:  Head: Normocephalic and atraumatic.  Right Ear: External ear normal.  Left Ear: External ear normal.  Mouth/Throat: Oropharynx is clear and moist.  Eyes: Pupils are equal, round, and reactive to light. Conjunctivae  and EOM are normal.  Neck: Normal range of motion. Neck supple. No thyromegaly present.  Cardiovascular: Normal rate, regular rhythm and normal heart sounds.  No murmur heard. Pulmonary/Chest: No respiratory distress. He has no wheezes. He has no rales.  Abdominal: Soft.  Bowel sounds are normal. He exhibits no distension and no mass. There is no tenderness. There is no rebound and no guarding.  Genitourinary: Rectum normal and prostate normal.  Musculoskeletal: He exhibits no edema.  Lymphadenopathy:    He has no cervical adenopathy.  Neurological: He is alert and oriented to person, place, and time. He displays normal reflexes. No cranial nerve deficit.  Skin: No rash noted.  Psychiatric: He has a normal mood and affect.       Assessment:     Physical exam. The following issues were addressed    Plan:     -Repeat DEXA scan with diagnosis last year of osteoporosis. If he has had progressive in his disease will recommend further therapy -Check screening lab work -The natural history of prostate cancer and ongoing controversy regarding screening and potential treatment outcomes of prostate cancer has been discussed with the patient. The meaning of a false positive PSA and a false negative PSA has been discussed. He indicates understanding of the limitations of this screening test and wishes to proceed with screening PSA testing. -Check phenobarbital level -Continue regular weightbearing exercise -We discussed shingles vaccine and at this point he wishes to wait -All other vaccines up-to-date  [name] MD [facility] at [facility]

## 2017-11-25 NOTE — Patient Instructions (Signed)
Set up DEXA scan.  °

## 2017-11-26 ENCOUNTER — Encounter: Payer: Self-pay | Admitting: Family Medicine

## 2017-11-27 ENCOUNTER — Encounter: Payer: Self-pay | Admitting: Family Medicine

## 2017-11-27 ENCOUNTER — Ambulatory Visit: Payer: Medicare Other | Admitting: Family Medicine

## 2017-11-27 VITALS — BP 110/60 | HR 58 | Temp 97.9°F | Wt 149.5 lb

## 2017-11-27 DIAGNOSIS — S30863A Insect bite (nonvenomous) of scrotum and testes, initial encounter: Secondary | ICD-10-CM | POA: Diagnosis not present

## 2017-11-27 DIAGNOSIS — W57XXXA Bitten or stung by nonvenomous insect and other nonvenomous arthropods, initial encounter: Secondary | ICD-10-CM | POA: Diagnosis not present

## 2017-11-27 LAB — PHENOBARBITAL LEVEL: Phenobarbital, Serum: 7.2 mg/L — ABNORMAL LOW (ref 15.0–40.0)

## 2017-11-27 NOTE — Patient Instructions (Signed)

## 2017-11-27 NOTE — Progress Notes (Signed)
  Subjective:     Patient ID: Nathan [name], male   DOB: [birth year], 72 y.o.   MRN: [id number]  HPI Patient is here for tick removal. He just discovered this in the shower last night on underneath side of the scrotum. He tried to remove the tick but leftmost of the tic  embedded. He has not had any skin rash, headaches, fevers, or chills.  Past Medical History:  Diagnosis Date  . Colon polyps    Sessile Serrated adenomas  . DYSPNEA 02/09/2009  . RESTLESS LEGS SYNDROME 02/28/2009  . SEIZURE DISORDER 02/09/2009   last seisure Sep 10, 2001   Past Surgical History:  Procedure Laterality Date  . APPENDECTOMY  06/01/11  . HERNIA REPAIR Right 02/11/09   dr. Donella Stade -[facility]  . LAPAROSCOPIC APPENDECTOMY  06/01/2011   Procedure: APPENDECTOMY LAPAROSCOPIC;  Surgeon: Shann Medal, MD;  Location: [facility];  Service: General;  Laterality: N/A;    reports that he has never smoked. He has never used smokeless tobacco. He reports that he does not drink alcohol or use drugs. family history includes Cancer in his father and mother; Diabetes Mellitus II in his father. Allergies  Allergen Reactions  . Carbamazepine     REACTION: fatigue, sleepy  . Ciprofloxacin     Per pt, "thinks caused rash"  . Clindamycin/Lincomycin Diarrhea  . Metronidazole     REACTION: GI upset  . Penicillins     REACTION: childhood, hives?  . Phenytoin     REACTION: rash, leg swelling  . Sulfamethoxazole Other (See Comments)  . Trimethoprim Other (See Comments)  . Bactrim Other (See Comments)    Caused headaches, that progressively worsened when on medication. Once off, they went away.  . Phenytoin Sodium Extended Swelling and Rash    Swelling of ankles. Happened years ago per patient.     Review of Systems  Constitutional: Negative for chills and fever.  Neurological: Negative for headaches.       Objective:   Physical Exam  Constitutional: He appears well-developed and well-nourished.  Skin:  Patient has partial  tick remnants embedded into posterior aspect the scrotum. Posterior third of the tic roughly has been removed already. No surrounding erythema or rash       Assessment:     Tick bite with [place] tick.    Plan:     -We used some fine teeth forceps and carefully removed the remainder of the tic including the head. -Reviewed tick issues/diseases and reviewed various types of tick related illness. -Follow-up for any fever, rash, or other concerns  [name] MD [facility] at [facility]

## 2017-12-03 ENCOUNTER — Encounter: Payer: Self-pay | Admitting: Family Medicine

## 2017-12-05 ENCOUNTER — Other Ambulatory Visit (INDEPENDENT_AMBULATORY_CARE_PROVIDER_SITE_OTHER): Payer: Medicare Other

## 2017-12-05 DIAGNOSIS — K921 Melena: Secondary | ICD-10-CM

## 2017-12-05 LAB — POC HEMOCCULT BLD/STL (HOME/3-CARD/SCREEN)
Card #2 Fecal Occult Blod, POC: NEGATIVE
FECAL OCCULT BLD: NEGATIVE
FECAL OCCULT BLD: NEGATIVE

## 2017-12-06 ENCOUNTER — Encounter: Payer: Self-pay | Admitting: Family Medicine

## 2017-12-10 ENCOUNTER — Ambulatory Visit (INDEPENDENT_AMBULATORY_CARE_PROVIDER_SITE_OTHER)
Admission: RE | Admit: 2017-12-10 | Discharge: 2017-12-10 | Disposition: A | Discharge status: Home / Self Care | Payer: Medicare Other | Source: Ambulatory Visit

## 2017-12-10 DIAGNOSIS — M818 Other osteoporosis without current pathological fracture: Secondary | ICD-10-CM

## 2017-12-15 ENCOUNTER — Encounter: Payer: Self-pay | Admitting: Family Medicine

## 2018-01-08 ENCOUNTER — Ambulatory Visit: Payer: Medicare Other | Admitting: Neurology

## 2018-01-08 ENCOUNTER — Encounter: Payer: Self-pay | Admitting: Neurology

## 2018-01-08 VITALS — BP 110/68 | HR 58 | Ht 71.0 in | Wt 150.4 lb

## 2018-01-08 DIAGNOSIS — G40909 Epilepsy, unspecified, not intractable, without status epilepticus: Secondary | ICD-10-CM | POA: Diagnosis not present

## 2018-01-08 DIAGNOSIS — G2581 Restless legs syndrome: Secondary | ICD-10-CM | POA: Diagnosis not present

## 2018-01-08 DIAGNOSIS — G609 Hereditary and idiopathic neuropathy, unspecified: Secondary | ICD-10-CM | POA: Diagnosis not present

## 2018-01-08 NOTE — Progress Notes (Signed)
Follow-up Visit   Date: 01/08/18    Nathan [name] MRN: [id number] DOB: [birth year]   Interim History: Nathan [name] is a 72 y.o. right-handed Caucasian with seizure disorder (maintained on PHB, seizure free since 2003) and restless leg syndrome returning to the clinic for follow-up of neuropathy.  The patient was accompanied to the clinic by wife who also provides collateral information.    History of present illness: Starting around 2014, patient developed numbness/tingling of the soles of his feet which was constant. He would wear compression stockings for varicose veins and noticed the numbness when he would take them off in the evening and when he would shower. Six months prior to symptom onset, he was getting varicose vein injections throughout his lower extremities, more on the right. His last treatment was in October 2014. In April 2015, his numbness suddenly worsened to involve his lower legs and knees, worse on the right. Over the past few months, his leg numbness has improved some.He endorses mild problems with balance and has been doing home balance exercises. He is on a very strict low-sugar, low-salt, vegetarian diet and reports that since taking vitamin B12 oral supplements, symptoms may have improved.   Patient was seen at [facility] by Dr. Leta Baptist in September 2015 for bilateral feet numbness. EMG of the leg showed lumbar radiculopathy.  MRI lumbar spine which showed multilevel degenerative changes, without foraminal stenosis. Patient sought the opinion of Dr. Vertell Limber for his lumbar radiculopathy who did not feel that symptoms were due to lumbar disease, and instead was concerning about neuropathy, and referred for second opinion.   Of note, he also has a history of seizure disorder which started at age 75 years old. 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 He was first placed on Dilantin, but due to ankle  swelling he was switched to tegretol, but this caused him to be sleepy. He was transitioned to phenobarbital 30 mg which controlled his seizures. He had a second seizure in 1997 but then was seizure free until February 2003 during a period when he was taken off phenobarbital for 3-4 months prior, due to long period of seizure freedom. After his last breakthrough seizure he has been on phenobarbital 60 mg at night.   He was found to have osteoporosis and has started algea-cal.    UPDATE 01/08/2018:  He is here for 1 year appointment.  He has been doing well without interval seizures, falls, or hospitalizations.  In March 2019, he and his wife moved into a retirement community and are transitioning to this.  He continues to tolerate PHB 55m daily.  Recent drug levels have been low, but he remains seizure-free.  He continues to be bothered by RLS at night and nearly always wakes up to walk around to get relief. He does not wish to take medication for this.    Medications:  Current Outpatient Medications on File Prior to Visit  Medication Sig Dispense Refill  . cholecalciferol (VITAMIN D) 1000 UNITS tablet Take 1,000 Units by mouth every Monday, Wednesday, and Friday.     . Cod Liver Oil OIL Take 5 mLs by mouth daily.      . Coenzyme Q10 (CO Q 10) 60 MG CAPS Take 1 tablet by mouth daily.      . cyanocobalamin 500 MCG tablet Take 1,000 mcg by mouth daily.    . Homeopathic Products ([facility] RELIEF) SOLN  Apply 1 drop to eye daily as needed. Dry Eyes     . hyoscyamine (LEVSIN/SL) 0.125 MG SL tablet Place 1 tablet (0.125 mg total) under the tongue every 4 (four) hours as needed. 30 tablet 1  . OVER THE COUNTER MEDICATION AlgeaCal    . OVER THE COUNTER MEDICATION Strontium citrate    . PHENObarbital (LUMINAL) 60 MG tablet Take 1 tablet (60 mg total) by mouth at bedtime. 90 tablet 3  . Probiotic Product (PROBIOTIC DAILY PO) Take by mouth daily.    . vitamin C (ASCORBIC ACID) 500 MG tablet Take 500  mg by mouth 3 (three) times daily.       No current facility-administered medications on file prior to visit.     Allergies:  Allergies  Allergen Reactions  . Carbamazepine     REACTION: fatigue, sleepy  . Ciprofloxacin     Per pt, "thinks caused rash"  . Clindamycin/Lincomycin Diarrhea  . Metronidazole     REACTION: GI upset  . Penicillins     REACTION: childhood, hives?  . Phenytoin     REACTION: rash, leg swelling  . Sulfamethoxazole Other (See Comments)  . Trimethoprim Other (See Comments)  . Bactrim Other (See Comments)    Caused headaches, that progressively worsened when on medication. Once off, they went away.  . Phenytoin Sodium Extended Swelling and Rash    Swelling of ankles. Happened years ago per patient.    Review of Systems:  CONSTITUTIONAL: No fevers, chills, night sweats, or weight loss.  EYES: No visual changes or eye pain ENT: No hearing changes.  No history of nose bleeds.   RESPIRATORY: No cough, wheezing and shortness of breath.   CARDIOVASCULAR: Negative for chest pain, and palpitations.   GI: Negative for abdominal discomfort, blood in stools or black stools.  No recent change in bowel habits.   GU:  No history of incontinence.   MUSCLOSKELETAL: No history of joint pain or swelling.  No myalgias.   SKIN: Negative for lesions, rash, and itching.   ENDOCRINE: Negative for cold or heat intolerance, polydipsia or goiter.   PSYCH:  No depression or anxiety symptoms.   NEURO: As Above.   Vital Signs:  BP 110/68   Pulse (!) 58   Ht _0  (1.803 m)   Wt 150 lb 6 oz (68.2 kg)   SpO2 98%   BMI 20.97 kg/m   General Medical Exam:   General:  Well appearing, comfortable  Eyes/ENT: see cranial nerve examination.   Neck: No masses appreciated.  Full range of motion without tenderness.  No carotid bruits. Respiratory:  Clear to auscultation, good air entry bilaterally.   Cardiac:  Regular rate and rhythm, no murmur.   Ext:  No edema  Neurological  Exam: MENTAL STATUS including orientation to time, place, person, recent and remote memory, attention span and concentration, language, and fund of knowledge is normal.  Speech is not dysarthric.  CRANIAL NERVES:  Pupils equal round and reactive to light.  Normal conjugate, extra-ocular eye movements in all directions of gaze.  Face is symmetric. Palate elevates symmetrically.  Tongue is midline.  MOTOR:  Motor strength is 5/5 in all extremities, except toe flexion and extension is 5-/5. No pronator drift.  Tone is normal.    MSRs:  Reflexes are 2+/4 throughout, except absent Achilles.  Negative Rhomberg.  SENSORY:  Mildly reduced vibration at the ankles and absent at the great toe bilaterally. Pin prick and temperature is intact. Sensation intact at  the knees and in the hands.  COORDINATION/GAIT:  Intact rapid alternating movements bilaterally.  Gait narrow based and stable.  Very mild unsteadiness with tandem gait.   Data: EM[facility] performed by Dr. Leta Baptist at [facility] 04/21/2014: Abnormal study demonstrating: 1. Evidence of bilateral lumbar radiculopathies. 2. No evidence of underlying widespread polyneuropathy.  MRI lumbar spine 05/11/2014: 1. Mild multilevel lumbar disc degeneration and facet arthrosis resulting in mild bilateral lateral recess stenosis at L3-4 and L4-5 and moderate left neural foraminal stenosis at L3-4. No spinal canal stenosis. 2. Diffuse bone marrow heterogeneity, nonspecific. Considerations include anemia, smoking, and infiltrative processes (including multiple myeloma).  Labs 09/15/2014:  Vitamin B6 15.5, vitamin B1 14, copper 80, zinc 62 Lab Results  Component Value Date   TSH 0.89 11/25/2017   Lab Results  Component Value Date   VITAMINB12 1,050 (H) 01/07/2017   Lab Results  Component Value Date   FERRITIN 113.2 01/07/2017   Labs 11/25/2017:  PHB level 7.2*  IMPRESSION/PLAN: 1.  Idiopathic distal and symmetric peripheral neuropathy, stable and without  worsening. Symptoms are predominately numbness, without pain.  He endorses some imbalance and will be more complaint about doing his PT exercises.  I also suggested starting tai chi for balance.   2.  Grand mal seizures, last seizure in 2003.    - Clinically doing well on phenobarbitol 80m daily, which will be continued.   - PHB levels has always been low, but he remains seizure-free so no need for medications changes  3.  RLS and hypnic jerks, pharmacological treatment declined  Return to clinic in 1 year  Greater than 50% of this 20 minute visit was spent in counseling, explanation of diagnosis, planning of further management, and coordination of care.   Thank you for allowing me to participate in patient's care.  If I can answer any additional questions, I would be pleased to do so.    Sincerely,    Donika K. PPosey Pronto DO

## 2018-01-08 NOTE — Patient Instructions (Signed)
Continue your medications as you are taking  Return to clinic in 1 year 

## 2018-03-21 ENCOUNTER — Encounter: Payer: Self-pay | Admitting: Neurology

## 2018-04-21 ENCOUNTER — Other Ambulatory Visit: Payer: Self-pay

## 2018-04-21 ENCOUNTER — Ambulatory Visit: Payer: Medicare Other | Admitting: Family Medicine

## 2018-04-21 ENCOUNTER — Encounter: Payer: Self-pay | Admitting: Family Medicine

## 2018-04-21 VITALS — BP 116/74 | HR 58 | Temp 98.2°F | Ht 71.0 in | Wt 150.8 lb

## 2018-04-21 DIAGNOSIS — N644 Mastodynia: Secondary | ICD-10-CM

## 2018-04-21 NOTE — Progress Notes (Signed)
  Subjective:     Patient ID: Nathan [name], male   DOB: [birth year], 72 y.o.   MRN: [id number]  HPI Patient is seen with about 1 month history of some pain around the left breast and nipple region.  He has not noted any bloody discharge, nipple inversion, rash, adenopathy.  He had apparently similar type episode back in 2011.  Mammogram at that point was unremarkable.  No right breast tenderness.  He states he has had some mild left pectoral muscle atrophy for at least a few years.  Denies any major lower cervical neck pain.  Past Medical History:  Diagnosis Date  . Colon polyps    Sessile Serrated adenomas  . DYSPNEA 02/09/2009  . RESTLESS LEGS SYNDROME 02/28/2009  . SEIZURE DISORDER 02/09/2009   last seisure Sep 10, 2001   Past Surgical History:  Procedure Laterality Date  . APPENDECTOMY  06/01/11  . HERNIA REPAIR Right 02/11/09   dr. Donella Stade -[facility]  . LAPAROSCOPIC APPENDECTOMY  06/01/2011   Procedure: APPENDECTOMY LAPAROSCOPIC;  Surgeon: Shann Medal, MD;  Location: [facility];  Service: General;  Laterality: N/A;    reports that he has never smoked. He has never used smokeless tobacco. He reports that he does not drink alcohol or use drugs. family history includes Cancer in his father and mother; Diabetes Mellitus II in his father. Allergies  Allergen Reactions  . Carbamazepine     REACTION: fatigue, sleepy  . Ciprofloxacin     Per pt, "thinks caused rash"  . Clindamycin/Lincomycin Diarrhea  . Metronidazole     REACTION: GI upset  . Penicillins     REACTION: childhood, hives?  . Phenytoin     REACTION: rash, leg swelling  . Sulfamethoxazole Other (See Comments)  . Trimethoprim Other (See Comments)  . Bactrim Other (See Comments)    Caused headaches, that progressively worsened when on medication. Once off, they went away.  . Phenytoin Sodium Extended Swelling and Rash    Swelling of ankles. Happened years ago per patient.     Review of Systems  Constitutional: Negative  for appetite change and unexpected weight change.  Skin: Negative for rash.  Hematological: Negative for adenopathy.       Objective:   Physical Exam  Constitutional: He appears well-developed and well-nourished.  Cardiovascular: Normal rate and regular rhythm.  Pulmonary/Chest: Effort normal and breath sounds normal.  Breasts are examined.  No mass whatsoever.  No nipple inversion.  No rashes.  No chest wall or axillary adenopathy.  No reproducible tenderness on exam today.  He does have slight muscle atrophy left breast compared to right.  No significant gynecomastia.       Assessment:     Patient presents with 1 month history of left breast pain.  No cellulitis changes.  No visible rashes.  No mass.    Plan:     -Recommend observation for now.  Follow-up promptly for any nipple inversion, bloody nipple discharge, rash, or other concern  [name] MD [facility] at [facility]

## 2018-04-21 NOTE — Patient Instructions (Signed)
Follow up for any nipple inversion, nipple discharge (bloody), palpable breast mass, or any axillary lymph node enlargement.

## 2018-04-29 ENCOUNTER — Ambulatory Visit (INDEPENDENT_AMBULATORY_CARE_PROVIDER_SITE_OTHER): Payer: Medicare Other

## 2018-04-29 DIAGNOSIS — Z23 Encounter for immunization: Secondary | ICD-10-CM | POA: Diagnosis not present

## 2018-05-20 ENCOUNTER — Ambulatory Visit: Payer: Medicare Other | Admitting: Family Medicine

## 2018-05-20 ENCOUNTER — Other Ambulatory Visit: Payer: Self-pay

## 2018-05-20 ENCOUNTER — Encounter: Payer: Self-pay | Admitting: Family Medicine

## 2018-05-20 VITALS — BP 110/64 | HR 65 | Temp 98.2°F | Ht 71.0 in | Wt 148.6 lb

## 2018-05-20 DIAGNOSIS — Z87898 Personal history of other specified conditions: Secondary | ICD-10-CM

## 2018-05-20 DIAGNOSIS — N644 Mastodynia: Secondary | ICD-10-CM

## 2018-05-20 NOTE — Progress Notes (Signed)
Subjective:     Patient ID: Nathan [name], male   DOB: [birth year], 72 y.o.   MRN: [id number]  HPI Patient seen for the following items  Brings in [facility] forms to be completed.  This was triggered because of history of seizure disorder.  He had grand mal seizure back in 1994.  Etiology unclear.  Has not had any seizures since 2003.  He has been very stable with regimen of phenobarbital 60 mg nightly.  His seizures occurred at night.  Is never had seizure during waking hours.  He had phenobarbital level back in May which was slightly low.  He has been on the same dose of phenobarbital for several years.  Other issue of left breast pain over the past several weeks.  He had been seen here once previously for that.  He had similar occurrence with right breast pain back in 2011 with normal mammogram at that time.  We had not noted any breast masses whatsoever.  No adenopathy.  No nipple inversion.  No nipple discharge.  No clear exacerbating factors.  Pain is intermittent.  No recent rash.  No chest pain.  No cough.  No pleuritic pain.  Past Medical History:  Diagnosis Date  . Colon polyps    Sessile Serrated adenomas  . DYSPNEA 02/09/2009  . RESTLESS LEGS SYNDROME 02/28/2009  . SEIZURE DISORDER 02/09/2009   last seisure Sep 10, 2001   Past Surgical History:  Procedure Laterality Date  . APPENDECTOMY  06/01/11  . HERNIA REPAIR Right 02/11/09   dr. Donella Stade -[facility]  . LAPAROSCOPIC APPENDECTOMY  06/01/2011   Procedure: APPENDECTOMY LAPAROSCOPIC;  Surgeon: Shann Medal, MD;  Location: [facility];  Service: General;  Laterality: N/A;    reports that he has never smoked. He has never used smokeless tobacco. He reports that he does not drink alcohol or use drugs. family history includes Cancer in his father and mother; Diabetes Mellitus II in his father. Allergies  Allergen Reactions  . Carbamazepine     REACTION: fatigue, sleepy  . Ciprofloxacin     Per pt, "thinks caused rash"  .  Clindamycin/Lincomycin Diarrhea  . Metronidazole     REACTION: GI upset  . Penicillins     REACTION: childhood, hives?  . Phenytoin     REACTION: rash, leg swelling  . Sulfamethoxazole Other (See Comments)  . Trimethoprim Other (See Comments)  . Bactrim Other (See Comments)    Caused headaches, that progressively worsened when on medication. Once off, they went away.  . Phenytoin Sodium Extended Swelling and Rash    Swelling of ankles. Happened years ago per patient.     Review of Systems  Constitutional: Negative for chills and fever.  Respiratory: Negative for cough and shortness of breath.   Cardiovascular: Negative for chest pain.  Skin: Negative for rash.  Neurological: Negative for dizziness, seizures, syncope and headaches.  Hematological: Negative for adenopathy.  Psychiatric/Behavioral: Negative for confusion.       Objective:   Physical Exam  Constitutional: He is oriented to person, place, and time. He appears well-developed and well-nourished.  Cardiovascular: Normal rate and regular rhythm.  Pulmonary/Chest: Effort normal and breath sounds normal.  Breasts are symmetric.  He does not have any significant gynecomastia.  No nipple inversion.  No nipple discharge.  No left breast mass.  No axillary adenopathy.  No erythema.  No warmth.  No rashes.  Neurological: He is alert and oriented to person, place, and time. No cranial nerve deficit.  Psychiatric: He has a normal mood and affect. His behavior is normal. Thought content normal.       Assessment:     #1 past history of grand mal seizure with none now in 16 years.  Stable on phenobarbital  #2 left breast pain.  Etiology unclear.  No asymmetric gynecomastia.  No breast masses.  No signs of infection    Plan:     -Discussed possible mammogram left breast but he has no mass whatsoever and he found this to be very painful previously and he declines at this time. -Watch for any mass, adenopathy, nipple  inversion, or nipple discharge. -Forms completed from [facility].  We see no reason to restrict his driving at this time.  He has been seizure-free for 16 years and very stable on current medication regimen.  [name] MD [facility]'

## 2018-06-19 ENCOUNTER — Encounter: Payer: Self-pay | Admitting: Family Medicine

## 2018-06-24 MED ORDER — PHENOBARBITAL 60 MG PO TABS: 60.0000 mg | ORAL_TABLET | Freq: Every day | ORAL | 1 refills | Status: DC

## 2018-06-24 NOTE — Telephone Encounter (Signed)
Printed Rx faxed to [facility] at [phone].

## 2018-11-03 ENCOUNTER — Other Ambulatory Visit: Payer: Self-pay

## 2018-11-03 ENCOUNTER — Ambulatory Visit (INDEPENDENT_AMBULATORY_CARE_PROVIDER_SITE_OTHER): Payer: Medicare Other | Admitting: Family Medicine

## 2018-11-03 DIAGNOSIS — Z0000 Encounter for general adult medical examination without abnormal findings: Secondary | ICD-10-CM | POA: Diagnosis not present

## 2018-11-03 NOTE — Progress Notes (Signed)
Patient ID: Nathan [name], male   DOB: 07/23/1945, 73 y.o.   MRN: [id number]  Virtual Visit via Video Note  I connected with Taichi Heard on 11/03/18 at  9:30 AM EDT by a video enabled telemedicine application and verified that I am speaking with the correct person using two identifiers.  Location patient: home Location provider:work or home office Persons participating in the virtual visit: patient, provider  I discussed the limitations of evaluation and management by telemedicine and the availability of in person appointments. The patient expressed understanding and agreed to proceed.   HPI: Patient is generally very healthy.  He has history of osteoporosis.  He takes algae Cal supplement and had improvement by last DEXA scan last year.  He has history of restless leg syndrome and also remote history of seizures as well as history of adenomatous colon polyps.    1.  Risk factors based on Past Medical , Social, and Family history reviewed and as indicated above with no changes  2.  Limitations in physical activities None.  Stays very active.  No recent falls.  Exercises regularly.  Good balance.  3.  Depression/mood No active depression or anxiety issues.  PHQ 2 score of 0  4.  Hearing he has some chronic hearing loss and got hearing aids last year.  They are functioning fairly well  5.  ADLs independent in all.  6.  Cognitive function (orientation to time and place, language, writing, speech,memory) no short or long term memory issues.  Language and judgement intact.  Stays very engaged with cognitive activities and does lots of reading.  No memory deficits.  7.  Home Safety no issues.  No throw rugs or other significant trip hazards  8.  Height, weight, and visual acuity.all stable.  Tries to get yearly eye exams.  He is due in August for follow-up  9.  Counseling discussed -importance of continued exercise  10. Recommendation of preventive services.  Repeat colonoscopy due May 2020.   Also discussed Shingrix vaccine which they will consider later on  11. Labs based on risk factors-we are deferring any labs at this point.  Will consider after current pandemic passes  12. Care Plan-as above  13. Other Providers-Dr. Pyrtle- gastroenterology  14. Written schedule of screening/prevention services discussed with -Continue with yearly flu vaccine -Hepatitis C antibody screen negative 2018 -Tetanus due 01/08/2027 -Colonoscopy due 11/25/2018 -Had Zostavax 2012   ROS: See pertinent positives and negatives per HPI.  Past Medical History:  Diagnosis Date  . Colon polyps    Sessile Serrated adenomas  . DYSPNEA 02/09/2009  . RESTLESS LEGS SYNDROME 02/28/2009  . SEIZURE DISORDER 02/09/2009   last seisure Sep 10, 2001    Past Surgical History:  Procedure Laterality Date  . APPENDECTOMY  06/01/11  . HERNIA REPAIR Right 02/11/09   dr. cornette -[facility]  . LAPAROSCOPIC APPENDECTOMY  06/01/2011   Procedure: APPENDECTOMY LAPAROSCOPIC;  Surgeon: David H Newman, MD;  Location: [facility];  Service: General;  Laterality: N/A;    Family History  Problem Relation Age of Onset  . Cancer Mother   . Cancer Father        multiple myeloma  . Diabetes Mellitus II Father   . Stroke Neg Hx        grandparent  . Colon cancer Neg Hx   . Esophageal cancer Neg Hx   . Rectal cancer Neg Hx   . Stomach cancer Neg Hx     SOCIAL HX: Married.    Lives [place].  Non-smoker.  No alcohol use.  No children.   Current Outpatient Medications:  .  cholecalciferol (VITAMIN D) 1000 UNITS tablet, Take 1,000 Units by mouth daily. , Disp: , Rfl:  .  Cod Liver Oil OIL, Take 5 mLs by mouth daily.  , Disp: , Rfl:  .  Coenzyme Q10 (CO Q 10) 60 MG CAPS, Take 1 tablet by mouth daily.  , Disp: , Rfl:  .  cyanocobalamin 500 MCG tablet, Take 1,000 mcg by mouth daily., Disp: , Rfl:  .  Homeopathic Products (SIMILASAN DRY EYE RELIEF) SOLN, Apply 1 drop to eye daily as needed. Dry Eyes , Disp: , Rfl:  .   hyoscyamine (LEVSIN/SL) 0.125 MG SL tablet, Place 1 tablet (0.125 mg total) under the tongue every 4 (four) hours as needed., Disp: 30 tablet, Rfl: 1 .  OVER THE COUNTER MEDICATION, AlgeaCal, Disp: , Rfl:  .  OVER THE COUNTER MEDICATION, Strontium citrate, Disp: , Rfl:  .  PHENObarbital (LUMINAL) 60 MG tablet, Take 1 tablet (60 mg total) by mouth at bedtime., Disp: 90 tablet, Rfl: 1 .  Probiotic Product (PROBIOTIC DAILY PO), Take by mouth daily., Disp: , Rfl:  .  vitamin C (ASCORBIC ACID) 500 MG tablet, Take 500 mg by mouth 3 (three) times daily.  , Disp: , Rfl:   EXAM:  VITALS per patient if applicable:  GENERAL: alert, oriented, appears well and in no acute distress  HEENT: atraumatic, conjunttiva clear, no obvious abnormalities on inspection of external nose and ears  NECK: normal movements of the head and neck  LUNGS: on inspection no signs of respiratory distress, breathing rate appears normal, no obvious gross SOB, gasping or wheezing  CV: no obvious cyanosis  MS: moves all visible extremities without noticeable abnormality  PSYCH/NEURO: pleasant and cooperative, no obvious depression or anxiety, speech and thought processing grossly intact  ASSESSMENT AND PLAN:  Discussed the following assessment and plan:  Medicare subsequent annual wellness visit.  We discussed the following  -Consider Shingrix vaccine at some point this year -Continue with yearly flu vaccine -Consider repeat DEXA scan by next year -Repeat colonoscopy when this can safely be done this year     I discussed the assessment and treatment plan with the patient. The patient was provided an opportunity to ask questions and all were answered. The patient agreed with the plan and demonstrated an understanding of the instructions.   The patient was advised to call back or seek an in-person evaluation if the symptoms worsen or if the condition fails to improve as anticipated.   Bruce Burchette, MD   

## 2018-11-20 ENCOUNTER — Encounter: Payer: Self-pay | Admitting: Internal Medicine

## 2018-12-02 ENCOUNTER — Encounter: Payer: Self-pay | Admitting: Family Medicine

## 2018-12-02 ENCOUNTER — Other Ambulatory Visit: Payer: Self-pay | Admitting: Family Medicine

## 2018-12-03 ENCOUNTER — Other Ambulatory Visit: Payer: Self-pay

## 2018-12-03 NOTE — Telephone Encounter (Signed)
Please send in for patient since this is a controlled substance. Thanks!

## 2018-12-05 ENCOUNTER — Encounter: Payer: Self-pay | Admitting: Internal Medicine

## 2018-12-11 ENCOUNTER — Other Ambulatory Visit: Payer: Self-pay

## 2018-12-11 ENCOUNTER — Encounter: Payer: Self-pay | Admitting: Internal Medicine

## 2018-12-11 ENCOUNTER — Ambulatory Visit: Payer: Medicare Other

## 2018-12-11 VITALS — Ht 71.0 in | Wt 150.0 lb

## 2018-12-11 DIAGNOSIS — Z86010 Personal history of colonic polyps: Secondary | ICD-10-CM

## 2018-12-11 MED ORDER — NA SULFATE-K SULFATE-MG SULF 17.5-3.13-1.6 GM/177ML PO SOLN: 1.0000 | Freq: Once | ORAL | 0 refills | Status: AC

## 2018-12-11 NOTE — Progress Notes (Signed)
No egg or soy allergy known to patient  No issues with past sedation with any surgeries  or procedures, no intubation problems  No diet pills per patient No home 02 use per patient  No blood thinners per patient  Pt denies issues with constipation  No A fib or A flutter  EMMI video sent to pt's e mail , pt declined    

## 2018-12-16 MED ORDER — PHENOBARBITAL 60 MG PO TABS: 60.0000 mg | ORAL_TABLET | Freq: Every day | ORAL | 1 refills | Status: DC

## 2018-12-16 NOTE — Addendum Note (Signed)
Addended by: [name] on: 12/16/2018 11:27 AM   Modules accepted: Orders

## 2018-12-16 NOTE — Telephone Encounter (Signed)
This prescription failed to go to pharmacy. Can you send again? For Phenobarbital 60 mg. Thanks!

## 2018-12-16 NOTE — Telephone Encounter (Signed)
I resent the Phenobarbital rx.

## 2018-12-18 ENCOUNTER — Telehealth: Payer: Self-pay | Admitting: Internal Medicine

## 2018-12-18 NOTE — Telephone Encounter (Signed)
Patient is scheduled for a procedure on 6-11. And would like to know if he can drink the first part of the prep 1hr early than scheduled.

## 2018-12-18 NOTE — Telephone Encounter (Signed)
LM for pt that he may do bottle 1 at 5 pm instead of 6 pm and to call with further questions

## 2018-12-24 ENCOUNTER — Ambulatory Visit: Payer: Medicare Other | Admitting: Neurology

## 2018-12-24 ENCOUNTER — Telehealth: Payer: Self-pay | Admitting: *Deleted

## 2018-12-24 NOTE — Telephone Encounter (Signed)
Covid-19 screening questions  Have you traveled in the last 14 days? no If yes where?  Do you now or have you had a fever in the last 14 days? no  Do you have any respiratory symptoms of shortness of breath or cough now or in the last 14 days? no  Do you have any family members or close contacts with diagnosed or suspected Covid-19 in the past 14 days? no  Have you been tested for Covid-19 and found to be positive? noPt is aware that care partner will wait in the car during parking lot; if they feel like they will be too hot to wait in the car; they may wait in the lobby.  We want them to wear a mask (we do not have any that we can provide them), practice social distancing, and we will check their temperatures when they get here.  I did remind patient that their care partner needs to stay in the parking lot the entire time. Pt will wear mask into building       

## 2018-12-24 NOTE — Telephone Encounter (Signed)
LMOM, will call back

## 2018-12-25 ENCOUNTER — Inpatient Hospital Stay (HOSPITAL_COMMUNITY)
Admission: EM | Admit: 2018-12-25 | Discharge: 2018-12-27 | DRG: 101 | Disposition: A | Discharge status: Home / Self Care | Payer: Medicare Other | Attending: Internal Medicine | Admitting: Internal Medicine

## 2018-12-25 ENCOUNTER — Other Ambulatory Visit: Payer: Self-pay

## 2018-12-25 ENCOUNTER — Emergency Department (HOSPITAL_COMMUNITY): Payer: Medicare Other

## 2018-12-25 ENCOUNTER — Telehealth: Payer: Self-pay | Admitting: Internal Medicine

## 2018-12-25 ENCOUNTER — Encounter (HOSPITAL_COMMUNITY): Payer: Self-pay

## 2018-12-25 ENCOUNTER — Encounter: Payer: Medicare Other | Admitting: Internal Medicine

## 2018-12-25 DIAGNOSIS — Z881 Allergy status to other antibiotic agents status: Secondary | ICD-10-CM

## 2018-12-25 DIAGNOSIS — Z807 Family history of other malignant neoplasms of lymphoid, hematopoietic and related tissues: Secondary | ICD-10-CM

## 2018-12-25 DIAGNOSIS — G2581 Restless legs syndrome: Secondary | ICD-10-CM | POA: Diagnosis present

## 2018-12-25 DIAGNOSIS — Z888 Allergy status to other drugs, medicaments and biological substances status: Secondary | ICD-10-CM

## 2018-12-25 DIAGNOSIS — Z79899 Other long term (current) drug therapy: Secondary | ICD-10-CM | POA: Diagnosis not present

## 2018-12-25 DIAGNOSIS — R569 Unspecified convulsions: Secondary | ICD-10-CM

## 2018-12-25 DIAGNOSIS — Z882 Allergy status to sulfonamides status: Secondary | ICD-10-CM

## 2018-12-25 DIAGNOSIS — M810 Age-related osteoporosis without current pathological fracture: Secondary | ICD-10-CM | POA: Diagnosis present

## 2018-12-25 DIAGNOSIS — R32 Unspecified urinary incontinence: Secondary | ICD-10-CM | POA: Diagnosis present

## 2018-12-25 DIAGNOSIS — E871 Hypo-osmolality and hyponatremia: Secondary | ICD-10-CM

## 2018-12-25 DIAGNOSIS — Z1159 Encounter for screening for other viral diseases: Secondary | ICD-10-CM

## 2018-12-25 DIAGNOSIS — Z833 Family history of diabetes mellitus: Secondary | ICD-10-CM | POA: Diagnosis not present

## 2018-12-25 DIAGNOSIS — Z880 Allergy status to penicillin: Secondary | ICD-10-CM | POA: Diagnosis not present

## 2018-12-25 DIAGNOSIS — G40409 Other generalized epilepsy and epileptic syndromes, not intractable, without status epilepticus: Principal | ICD-10-CM | POA: Diagnosis present

## 2018-12-25 DIAGNOSIS — Z9049 Acquired absence of other specified parts of digestive tract: Secondary | ICD-10-CM

## 2018-12-25 DIAGNOSIS — E876 Hypokalemia: Secondary | ICD-10-CM | POA: Diagnosis present

## 2018-12-25 LAB — BASIC METABOLIC PANEL
Anion gap: 13 (ref 5–15)
Anion gap: 9 (ref 5–15)
Anion gap: 6 (ref 5–15)
BUN: 11 mg/dL (ref 8–23)
BUN: 10 mg/dL (ref 8–23)
BUN: 9 mg/dL (ref 8–23)
CO2: 25 mmol/L (ref 22–32)
CO2: 27 mmol/L (ref 22–32)
CO2: 27 mmol/L (ref 22–32)
Calcium: 7 mg/dL — ABNORMAL LOW (ref 8.9–10.3)
Calcium: 7.9 mg/dL — ABNORMAL LOW (ref 8.9–10.3)
Calcium: 8.1 mg/dL — ABNORMAL LOW (ref 8.9–10.3)
Chloride: 82 mmol/L — ABNORMAL LOW (ref 98–111)
Chloride: 88 mmol/L — ABNORMAL LOW (ref 98–111)
Chloride: 93 mmol/L — ABNORMAL LOW (ref 98–111)
Creatinine, Ser: 0.54 mg/dL — ABNORMAL LOW (ref 0.61–1.24)
Creatinine, Ser: 0.59 mg/dL — ABNORMAL LOW (ref 0.61–1.24)
Creatinine, Ser: 0.67 mg/dL (ref 0.61–1.24)
GFR calc Af Amer: >60 mL/min (ref >60)
GFR calc Af Amer: >60 mL/min (ref >60)
GFR calc Af Amer: >60 mL/min (ref >60)
GFR calc non Af Amer: >60 mL/min (ref >60)
GFR calc non Af Amer: >60 mL/min (ref >60)
GFR calc non Af Amer: >60 mL/min (ref >60)
Glucose, Bld: 161 mg/dL — ABNORMAL HIGH (ref 70–99)
Glucose, Bld: 112 mg/dL — ABNORMAL HIGH (ref 70–99)
Glucose, Bld: 108 mg/dL — ABNORMAL HIGH (ref 70–99)
Potassium: 3.6 mmol/L (ref 3.5–5.1)
Potassium: 3.4 mmol/L — ABNORMAL LOW (ref 3.5–5.1)
Potassium: 3.5 mmol/L (ref 3.5–5.1)
Sodium: 120 mmol/L — ABNORMAL LOW (ref 135–145)
Sodium: 124 mmol/L — ABNORMAL LOW (ref 135–145)
Sodium: 126 mmol/L — ABNORMAL LOW (ref 135–145)

## 2018-12-25 LAB — URINALYSIS, ROUTINE W REFLEX MICROSCOPIC
Bilirubin Urine: NEGATIVE
Glucose, UA: NEGATIVE mg/dL
Hgb urine dipstick: NEGATIVE
Ketones, ur: 5 mg/dL — AB
Leukocytes,Ua: NEGATIVE
Nitrite: NEGATIVE
Protein, ur: 30 mg/dL — AB
Specific Gravity, Urine: 1.013 (ref 1.005–1.030)
pH: 7 (ref 5.0–8.0)

## 2018-12-25 LAB — CBC WITH DIFFERENTIAL/PLATELET
Abs Immature Granulocytes: 0.01 10*3/uL (ref 0.00–0.07)
Basophils Absolute: 0 10*3/uL (ref 0.0–0.1)
Basophils Relative: 0 %
Eosinophils Absolute: 0 10*3/uL (ref 0.0–0.5)
Eosinophils Relative: 0 %
HCT: 35.4 % — ABNORMAL LOW (ref 39.0–52.0)
Hemoglobin: 12.7 g/dL — ABNORMAL LOW (ref 13.0–17.0)
Immature Granulocytes: 0 %
Lymphocytes Relative: 11 %
Lymphs Abs: 0.5 10*3/uL — ABNORMAL LOW (ref 0.7–4.0)
MCH: 33.2 pg (ref 26.0–34.0)
MCHC: 35.9 g/dL (ref 30.0–36.0)
MCV: 92.4 fL (ref 80.0–100.0)
Monocytes Absolute: 0.4 10*3/uL (ref 0.1–1.0)
Monocytes Relative: 7 %
Neutro Abs: 4 10*3/uL (ref 1.7–7.7)
Neutrophils Relative %: 82 %
Platelets: 154 10*3/uL (ref 150–400)
RBC: 3.83 MIL/uL — ABNORMAL LOW (ref 4.22–5.81)
RDW: 12.2 % (ref 11.5–15.5)
WBC: 4.9 10*3/uL (ref 4.0–10.5)
nRBC: 0 % (ref 0.0–0.2)

## 2018-12-25 LAB — CBC
HCT: 33.6 % — ABNORMAL LOW (ref 39.0–52.0)
Hemoglobin: 11.8 g/dL — ABNORMAL LOW (ref 13.0–17.0)
MCH: 32.8 pg (ref 26.0–34.0)
MCHC: 35.1 g/dL (ref 30.0–36.0)
MCV: 93.3 fL (ref 80.0–100.0)
Platelets: 141 10*3/uL — ABNORMAL LOW (ref 150–400)
RBC: 3.6 MIL/uL — ABNORMAL LOW (ref 4.22–5.81)
RDW: 12.1 % (ref 11.5–15.5)
WBC: 7 10*3/uL (ref 4.0–10.5)
nRBC: 0 % (ref 0.0–0.2)

## 2018-12-25 LAB — COMPREHENSIVE METABOLIC PANEL
ALT: 33 U/L (ref 0–44)
AST: 50 U/L — ABNORMAL HIGH (ref 15–41)
Albumin: 3.6 g/dL (ref 3.5–5.0)
Alkaline Phosphatase: 63 U/L (ref 38–126)
Anion gap: 10 (ref 5–15)
BUN: 10 mg/dL (ref 8–23)
CO2: 26 mmol/L (ref 22–32)
Calcium: 7.5 mg/dL — ABNORMAL LOW (ref 8.9–10.3)
Chloride: 87 mmol/L — ABNORMAL LOW (ref 98–111)
Creatinine, Ser: 0.55 mg/dL — ABNORMAL LOW (ref 0.61–1.24)
GFR calc Af Amer: >60 mL/min (ref >60)
GFR calc non Af Amer: >60 mL/min (ref >60)
Glucose, Bld: 122 mg/dL — ABNORMAL HIGH (ref 70–99)
Potassium: 3.6 mmol/L (ref 3.5–5.1)
Sodium: 123 mmol/L — ABNORMAL LOW (ref 135–145)
Total Bilirubin: 1.1 mg/dL (ref 0.3–1.2)
Total Protein: 5.2 g/dL — ABNORMAL LOW (ref 6.5–8.1)

## 2018-12-25 LAB — OSMOLALITY, URINE: Osmolality, Ur: 427 mOsm/kg (ref 300–900)

## 2018-12-25 LAB — SODIUM, URINE, RANDOM: Sodium, Ur: <10 mmol/L

## 2018-12-25 LAB — PHOSPHORUS: Phosphorus: 3.2 mg/dL (ref 2.5–4.6)

## 2018-12-25 LAB — PHENOBARBITAL LEVEL: Phenobarbital: 8.4 ug/mL — ABNORMAL LOW (ref 15.0–30.0)

## 2018-12-25 MED ORDER — ONDANSETRON HCL 4 MG PO TABS: 4.0000 mg | ORAL_TABLET | Freq: Four times a day (QID) | ORAL | Status: DC | PRN

## 2018-12-25 MED ORDER — ENOXAPARIN SODIUM 40 MG/0.4ML ~~LOC~~ SOLN
40.0000 mg | SUBCUTANEOUS | Status: DC
  Administered 2018-12-25 – 2018-12-27 (×3): 40 mg via SUBCUTANEOUS
  Filled 2018-12-25 (×3): qty 0.4

## 2018-12-25 MED ORDER — ACETAMINOPHEN 325 MG PO TABS: 650.0000 mg | ORAL_TABLET | Freq: Four times a day (QID) | ORAL | Status: DC | PRN

## 2018-12-25 MED ORDER — SODIUM CHLORIDE 0.9 % IV SOLN
1.0000 g | Freq: Once | INTRAVENOUS | Status: AC
  Administered 2018-12-25: 1 g via INTRAVENOUS
  Filled 2018-12-25 (×2): qty 10

## 2018-12-25 MED ORDER — SODIUM CHLORIDE 0.9 % IV SOLN
INTRAVENOUS | Status: AC
  Administered 2018-12-25: 15:00:00 via INTRAVENOUS

## 2018-12-25 MED ORDER — LEVETIRACETAM IN NACL 500 MG/100ML IV SOLN
500.0000 mg | Freq: Two times a day (BID) | INTRAVENOUS | Status: DC
  Administered 2018-12-25 – 2018-12-27 (×5): 500 mg via INTRAVENOUS
  Filled 2018-12-25 (×6): qty 100

## 2018-12-25 MED ORDER — ONDANSETRON HCL 4 MG/2ML IJ SOLN
4.0000 mg | Freq: Four times a day (QID) | INTRAMUSCULAR | Status: DC | PRN
  Administered 2018-12-25: 4 mg via INTRAVENOUS
  Filled 2018-12-25: qty 2

## 2018-12-25 MED ORDER — ACETAMINOPHEN 650 MG RE SUPP: 650.0000 mg | Freq: Four times a day (QID) | RECTAL | Status: DC | PRN

## 2018-12-25 MED ORDER — SODIUM CHLORIDE 0.9 % IV SOLN
Freq: Once | INTRAVENOUS | Status: AC
  Administered 2018-12-25: 07:00:00 via INTRAVENOUS

## 2018-12-25 MED ORDER — PHENOBARBITAL 32.4 MG PO TABS
64.8000 mg | ORAL_TABLET | Freq: Every day | ORAL | Status: DC
  Administered 2018-12-25 – 2018-12-26 (×2): 64.8 mg via ORAL
  Filled 2018-12-25 (×2): qty 2

## 2018-12-25 MED ORDER — ONDANSETRON HCL 4 MG/2ML IJ SOLN
4.0000 mg | Freq: Once | INTRAMUSCULAR | Status: AC
  Administered 2018-12-25: 4 mg via INTRAVENOUS
  Filled 2018-12-25: qty 2

## 2018-12-25 MED ORDER — PHENOBARBITAL 60 MG PO TABS: 60.0000 mg | ORAL_TABLET | Freq: Every day | ORAL | Status: DC

## 2018-12-25 NOTE — ED Provider Notes (Signed)
[facility] DEPT Provider Note: Georgena Spurling, MD, FACEP  CSN: [id number] MRN: [id number] ARRIVAL: 12/25/18 at [place]: RESA/RESA   CHIEF COMPLAINT  Seizure  Level 5 caveat: Postictal state HISTORY OF PRESENT ILLNESS  12/25/18 4:02 AM Nathan [name] is a 73 y.o. male with a history of seizures, reportedly seizure-free since 2003.  He is on a phenobarbital which he takes nightly.  He is here after a generalized tonic-clonic seizure which lasted up to 2 minutes.  He had incontinence of urine.  He was postictally confused and combative on scene and EMS administered 5 mg of IV Versed.  He has become more cooperative.  No injuries were noted by EMS.  He knows he is at [facility] but cannot tell me the day, date, month or year.   Past Medical History:  Diagnosis Date  . Colon polyps    Sessile Serrated adenomas  . DYSPNEA 02/09/2009  . RESTLESS LEGS SYNDROME 02/28/2009  . SEIZURE DISORDER 02/09/2009   last seisure Sep 10, 2001    Past Surgical History:  Procedure Laterality Date  . APPENDECTOMY  06/01/11  . COLONOSCOPY    . HERNIA REPAIR Right 02/11/09   dr. Donella Stade -[facility]  . LAPAROSCOPIC APPENDECTOMY  06/01/2011   Procedure: APPENDECTOMY LAPAROSCOPIC;  Surgeon: Shann Medal, MD;  Location: [facility];  Service: General;  Laterality: N/A;  . POLYPECTOMY      Family History  Problem Relation Age of Onset  . Cancer Mother   . Cancer Father        multiple myeloma  . Diabetes Mellitus II Father   . Stroke Neg Hx        grandparent  . Colon cancer Neg Hx   . Esophageal cancer Neg Hx   . Rectal cancer Neg Hx   . Stomach cancer Neg Hx     Social History   Tobacco Use  . Smoking status: Never Smoker  . Smokeless tobacco: Never Used  Substance Use Topics  . Alcohol use: No    Alcohol/week: 0.0 standard drinks  . Drug use: No    Prior to Admission medications   Medication Sig Start Date End Date Taking? Authorizing Provider  cholecalciferol (VITAMIN D) 1000  UNITS tablet Take 1,000 Units by mouth daily.    Yes [provider]  Cod Liver Oil OIL Take 5 mLs by mouth daily.     Yes [provider]  Coenzyme Q10 (CO Q 10) 60 MG CAPS Take 1 tablet by mouth daily.     Yes [provider]  cyanocobalamin 500 MCG tablet Take 1,000 mcg by mouth daily.   Yes [provider]  Homeopathic Products [facility] DRY EYE RELIEF) SOLN Apply 1 drop to eye daily as needed. Dry Eyes    Yes [provider]  OVER THE COUNTER MEDICATION AlgeaCal   Yes [provider]  OVER THE COUNTER MEDICATION Strontium citrate   Yes [provider]  PHENObarbital (LUMINAL) 60 MG tablet Take 1 tablet (60 mg total) by mouth at bedtime. 12/16/18  Yes Burchette, Alinda Sierras, MD  Probiotic Product (PROBIOTIC DAILY PO) Take by mouth daily.   Yes [provider]  vitamin C (ASCORBIC ACID) 500 MG tablet Take 500 mg by mouth 3 (three) times daily.     Yes [provider]    Allergies Carbamazepine, Ciprofloxacin, Clindamycin/lincomycin, Metronidazole, Penicillins, Phenytoin, Sulfamethoxazole, Trimethoprim, Bactrim, and Phenytoin sodium extended   REVIEW OF SYSTEMS     PHYSICAL EXAMINATION  Initial Vital Signs Blood pressure (!) 147/69, pulse 71, temperature 98.2 F (36.8 C), temperature source Oral, resp. rate 13, SpO2 100 %.  Examination General: Well-developed, well-nourished male in no acute distress; appearance consistent with age of record HENT: normocephalic; superficial bite mark to left side of tongue Eyes: pupils equal, round and reactive to light; extraocular muscles intact Neck: supple Heart: regular rate and rhythm Lungs: clear to auscultation bilaterally Abdomen: soft; nondistended; nontender; no masses or hepatosplenomegaly; bowel sounds present Extremities: No deformity; full range of motion; pulses normal Neurologic: Awake, alert and oriented x2; motor function intact in all extremities and  symmetric; no facial droop Skin: Warm and dry Psychiatric: Flat affect   RESULTS  Summary of this visit's results, reviewed by myself:   EKG Interpretation  Date/Time:    Ventricular Rate:    PR Interval:    QRS Duration:   QT Interval:    QTC Calculation:   R Axis:     Text Interpretation:        Laboratory Studies: Results for orders placed or performed during the hospital encounter of 12/25/18 (from the past 24 hour(s))  CBC with Differential/Platelet     Status: Abnormal   Collection Time: 12/25/18  4:30 AM  Result Value Ref Range   WBC 4.9 4.0 - 10.5 K/uL   RBC 3.83 (L) 4.22 - 5.81 MIL/uL   Hemoglobin 12.7 (L) 13.0 - 17.0 g/dL   HCT 35.4 (L) 39.0 - 52.0 %   MCV 92.4 80.0 - 100.0 fL   MCH 33.2 26.0 - 34.0 pg   MCHC 35.9 30.0 - 36.0 g/dL   RDW 12.2 11.5 - 15.5 %   Platelets 154 150 - 400 K/uL   nRBC 0.0 0.0 - 0.2 %   Neutrophils Relative % 82 %   Neutro Abs 4.0 1.7 - 7.7 K/uL   Lymphocytes Relative 11 %   Lymphs Abs 0.5 (L) 0.7 - 4.0 K/uL   Monocytes Relative 7 %   Monocytes Absolute 0.4 0.1 - 1.0 K/uL   Eosinophils Relative 0 %   Eosinophils Absolute 0.0 0.0 - 0.5 K/uL   Basophils Relative 0 %   Basophils Absolute 0.0 0.0 - 0.1 K/uL   RBC Morphology MORPHOLOGY UNREMARKABLE    Immature Granulocytes 0 %   Abs Immature Granulocytes 0.01 0.00 - 0.07 K/uL  Basic metabolic panel     Status: Abnormal   Collection Time: 12/25/18  4:30 AM  Result Value Ref Range   Sodium 120 (L) 135 - 145 mmol/L   Potassium 3.6 3.5 - 5.1 mmol/L   Chloride 82 (L) 98 - 111 mmol/L   CO2 25 22 - 32 mmol/L   Glucose, Bld 161 (H) 70 - 99 mg/dL   BUN 11 8 - 23 mg/dL   Creatinine, Ser 0.54 (L) 0.61 - 1.24 mg/dL   Calcium 7.0 (L) 8.9 - 10.3 mg/dL   GFR calc non Af Amer >60 >60 mL/min   GFR calc Af Amer >60 >60 mL/min   Anion gap 13 5 - 15   Imaging Studies: No results found.  ED COURSE and MDM  Nursing notes and initial vitals signs, including pulse oximetry, reviewed.  Vitals:    12/25/18 0450 12/25/18 0500 12/25/18 0545 12/25/18 0630  BP: (!) 103/52 127/90 (!) 113/52 (!) 96/59  Pulse: 73 69 66 (!) 56  Resp: '20 18 18 15  '$ Temp:      TempSrc:      SpO2: 98% 95% 98% 98%  6:55 AM Patient continues to be confused and agitated.  I suspect his seizure was due to hyponatremia but phenobarbital level is pending.  CT scan pending as well.  Hospitalist to admit.  PROCEDURES    ED DIAGNOSES     ICD-10-CM   1. Hyponatremia  E87.1   2. Seizure ([place])  R56.9        Paysley Poplar, Jenny Reichmann, MD 12/25/18 [phone]

## 2018-12-25 NOTE — ED Notes (Signed)
Pt agitated and restless with Probation officer at bedside. Stating he is "sick to his stomach". Will notify RN Lovena Le

## 2018-12-25 NOTE — ED Notes (Signed)
Attempted to call report, but no answer, will attempt again.

## 2018-12-25 NOTE — ED Notes (Signed)
Bed: RESA Expected date:  Expected time:  Means of arrival:  Comments: EMS seizure

## 2018-12-25 NOTE — ED Notes (Signed)
Patient transported to CT 

## 2018-12-25 NOTE — H&P (Addendum)
History and Physical    NEEMA FLUEGGE DJT:[id number] DOB: [birth year] DOA: 12/25/2018  Referring MD/NP/PA: EDP PCP:  Patient coming from: Home  Chief Complaint: seizures  HPI: Nathan [name] is a 73 y.o. male with medical history significant of seizure disorder maintained on phenobarbital for 20+ years, last seizure was in 2003, osteoporosis was in his usual state of health until yesterday. -Patient reports that he started prep for colonoscopy yesterday, drank half of it and had been sick the whole day with numerous episodes of vomiting and watery stools as expected following the prep.  Patient reports that he had been feeling really sick and nauseous, overnight he had a seizure episode which is described as generalized tonic-clonic with urinary incontinence lasting close to a minute, he was given Versed by EMS, brought to the ER in a postictal, drowsy state. ED Course: The ED vital signs were stable, patient subsequently became more coherent, lab evaluation noted a sodium of 120 and calcium of 7.0, albumin is pending  Review of Systems: As per HPI otherwise 14 point review of systems negative.   Past Medical History:  Diagnosis Date  . Colon polyps    Sessile Serrated adenomas  . DYSPNEA 02/09/2009  . RESTLESS LEGS SYNDROME 02/28/2009  . SEIZURE DISORDER 02/09/2009   last seisure Sep 10, 2001    Past Surgical History:  Procedure Laterality Date  . APPENDECTOMY  06/01/11  . COLONOSCOPY    . HERNIA REPAIR Right 02/11/09   dr. Donella Stade -[facility]  . LAPAROSCOPIC APPENDECTOMY  06/01/2011   Procedure: APPENDECTOMY LAPAROSCOPIC;  Surgeon: Shann Medal, MD;  Location: [facility];  Service: General;  Laterality: N/A;  . POLYPECTOMY       reports that he has never smoked. He has never used smokeless tobacco. He reports that he does not drink alcohol or use drugs.  Allergies  Allergen Reactions  . Carbamazepine     REACTION: fatigue, sleepy  . Ciprofloxacin     Per pt, "thinks caused rash"  .  Clindamycin/Lincomycin Diarrhea  . Metronidazole     REACTION: GI upset  . Penicillins     REACTION: childhood, hives?  . Phenytoin     REACTION: rash, leg swelling  . Sulfamethoxazole Other (See Comments)  . Trimethoprim Other (See Comments)  . Bactrim Other (See Comments)    Caused headaches, that progressively worsened when on medication. Once off, they went away.  . Phenytoin Sodium Extended Swelling and Rash    Swelling of ankles. Happened years ago per patient.    Family History  Problem Relation Age of Onset  . Cancer Mother   . Cancer Father        multiple myeloma  . Diabetes Mellitus II Father   . Stroke Neg Hx        grandparent  . Colon cancer Neg Hx   . Esophageal cancer Neg Hx   . Rectal cancer Neg Hx   . Stomach cancer Neg Hx      Prior to Admission medications   Medication Sig Start Date End Date Taking? Authorizing Provider  cholecalciferol (VITAMIN D) 1000 UNITS tablet Take 1,000 Units by mouth daily.    Yes [provider]  Cod Liver Oil OIL Take 5 mLs by mouth daily.     Yes [provider]  Coenzyme Q10 (CO Q 10) 60 MG CAPS Take 1 tablet by mouth daily.     Yes [provider]  cyanocobalamin 500 MCG tablet Take 1,000 mcg by  mouth daily.   Yes [provider]  Homeopathic Products [facility] DRY EYE RELIEF) SOLN Apply 1 drop to eye daily as needed. Dry Eyes    Yes [provider]  OVER THE COUNTER MEDICATION AlgeaCal   Yes [provider]  OVER THE COUNTER MEDICATION Strontium citrate   Yes [provider]  PHENObarbital (LUMINAL) 60 MG tablet Take 1 tablet (60 mg total) by mouth at bedtime. 12/16/18  Yes Burchette, Alinda Sierras, MD  Probiotic Product (PROBIOTIC DAILY PO) Take by mouth daily.   Yes [provider]  vitamin C (ASCORBIC ACID) 500 MG tablet Take 500 mg by mouth 3 (three) times daily.     Yes [provider]    Physical Exam: Vitals:   12/25/18 0545 12/25/18 0630  12/25/18 0700 12/25/18 0754  BP: (!) 113/52 (!) 96/59 104/87 (!) 109/53  Pulse: 66 (!) 56 66 62  Resp: 18 15 (!) 21 16  Temp:    98.9 F (37.2 C)  TempSrc:    Oral  SpO2: 98% 98% 98% 94%  Weight:    69 kg  Height:    '5\' 11"'$  (1.803 m)      Constitutional: Thinly built elderly male laying in bed, alert awake oriented to self place and partly to time, hard of hearing Vitals:   12/25/18 0545 12/25/18 0630 12/25/18 0700 12/25/18 0754  BP: (!) 113/52 (!) 96/59 104/87 (!) 109/53  Pulse: 66 (!) 56 66 62  Resp: 18 15 (!) 21 16  Temp:    98.9 F (37.2 C)  TempSrc:    Oral  SpO2: 98% 98% 98% 94%  Weight:    69 kg  Height:    '5\' 11"'$  (1.803 m)   Eyes: PERRL, lids and conjunctivae normal ENMT: Oral mucosa is dry  neck: normal, supple Respiratory: Clear bilaterally Cardiovascular: Regular rate and rhythm, no murmurs / rubs / gallops Abdomen: soft, non tender, Bowel sounds positive.  Musculoskeletal: No joint deformity upper and lower extremities. Ext: No edema Skin: no rashes, lesions, ulcers.  Neurologic: Moves all extremities, no localizing signs Psychiatric: Appropriate mood and affect  Labs on Admission: I have personally reviewed following labs and imaging studies  CBC: Recent Labs  Lab 12/25/18 0430  WBC 4.9  NEUTROABS 4.0  HGB 12.7*  HCT 35.4*  MCV 92.4  PLT 979   Basic Metabolic Panel: Recent Labs  Lab 12/25/18 0430  NA 120*  K 3.6  CL 82*  CO2 25  GLUCOSE 161*  BUN 11  CREATININE 0.54*  CALCIUM 7.0*   GFR: Estimated Creatinine Clearance: 82.7 mL/min (A) (by C-G formula based on SCr of 0.54 mg/dL (L)). Liver Function Tests: No results for input(s): AST, ALT, ALKPHOS, BILITOT, PROT, ALBUMIN in the last 168 hours. No results for input(s): LIPASE, AMYLASE in the last 168 hours. No results for input(s): AMMONIA in the last 168 hours. Coagulation Profile: No results for input(s): INR, PROTIME in the last 168 hours. Cardiac Enzymes: No results for  input(s): CKTOTAL, CKMB, CKMBINDEX, TROPONINI in the last 168 hours. BNP (last 3 results) No results for input(s): PROBNP in the last 8760 hours. HbA1C: No results for input(s): HGBA1C in the last 72 hours. CBG: No results for input(s): GLUCAP in the last 168 hours. Lipid Profile: No results for input(s): CHOL, HDL, LDLCALC, TRIG, CHOLHDL, LDLDIRECT in the last 72 hours. Thyroid Function Tests: No results for input(s): TSH, T4TOTAL, FREET4, T3FREE, THYROIDAB in the last 72 hours. Anemia Panel: No results for  input(s): VITAMINB12, FOLATE, FERRITIN, TIBC, IRON, RETICCTPCT in the last 72 hours. Urine analysis:    Component Value Date/Time   COLORURINE YELLOW 11/23/2014 0713   APPEARANCEUR TURBID (A) 11/23/2014 0713   LABSPEC 1.010 11/23/2014 0713   PHURINE 7.5 11/23/2014 0713   GLUCOSEU NEGATIVE 11/23/2014 0713   GLUCOSEU NEGATIVE 01/22/2011 0738   HGBUR NEGATIVE 11/23/2014 0713   HGBUR negative 01/23/2010 1030   BILIRUBINUR neg 02/05/2017 1144   KETONESUR NEGATIVE 11/23/2014 0713   PROTEINUR neg 02/05/2017 1144   PROTEINUR NEGATIVE 11/23/2014 0713   UROBILINOGEN 0.2 02/05/2017 1144   UROBILINOGEN 0.2 11/23/2014 0713   NITRITE neg 02/05/2017 1144   NITRITE NEGATIVE 11/23/2014 0713   LEUKOCYTESUR Negative 02/05/2017 1144   Sepsis Labs: '@LABRCNTIP'$ (procalcitonin:4,lacticidven:4) )No results found for this or any previous visit (from the past 240 hour(s)).   Radiological Exams on Admission: Ct Head Wo Contrast  Result Date: 12/25/2018 CLINICAL DATA:  Seizure. EXAM: CT HEAD WITHOUT CONTRAST TECHNIQUE: Contiguous axial images were obtained from the base of the skull through the vertex without intravenous contrast. COMPARISON:  None. FINDINGS: Brain: There is no evidence of acute infarct, intracranial hemorrhage, mass, midline shift, or extra-axial fluid collection. The ventricles and sulci are normal. Patchy cerebral white matter hypodensities are nonspecific but compatible with  mild-to-moderate chronic small vessel ischemic disease. Vascular: Calcified atherosclerosis at the skull base. No hyperdense vessel. Skull: No fracture focal osseous lesion. Sinuses/Orbits: Mild left maxillary sinus mucosal thickening. Clear mastoid air cells. Unremarkable orbits. Other: None. IMPRESSION: 1. No evidence of acute intracranial abnormality. 2. Mild-to-moderate chronic small vessel ischemic disease. Electronically Signed   By: Logan Bores M.D.   On: 12/25/2018 07:45    EKG: Independently reviewed.  Pending  Assessment/Plan  1.  Breakthrough seizures -Long history of controlled seizures on phenobarbital, despite low levels, on chart review his phenobarbital level has been ranging from 8.4-9 range, for the past 3 to 4 years,  -Suspect his seizure was likely triggered by hyponatremia and possibly hypercalcemia and not low phenobarb levels -Will work on correcting metabolic abnormalities, discussed with neurology, will not change phenobarbital dose however while metabolic derangements are being corrected will start him on Keppra 500 mg twice daily for 2 days, he will not need this at discharge -CT head without acute findings  2.  Hyponatremia -Acute likely secondary to GI losses from excessive vomiting and diarrhea following colonoscopy prep yesterday -Urine sodium and osmolarity ordered will follow-up -Patient is already gotten close to a liter of saline in the ER, will stop fluids now and check a stat serum sodium, if no improvement will consider short duration of hypertonic saline -Will monitor sodium level closely  3.  Hypocalcemia -Check albumin to determine corrected calcium, if low will check PTH vitamin D level phosphorus etc.  DVT prophylaxis: Lovenox Code Status: Full code Family Communication: No family at bedside, will update wife Disposition Plan: Home likely 48 to 72 hours Consults called: None, called and d/w Neurology Level [place] Admission status: Inpatient   Domenic Polite MD [facility] Pager [phone]  If 7PM-7AM, please contact night-coverage [email or web address] Password [facility]  12/25/2018, 8:35 AM

## 2018-12-25 NOTE — ED Triage Notes (Addendum)
Per EMS, patient coming from home with complaints of a seizure. Patients seizure lasted from 45 seconds to 2 minutes, with incontinence of urine. EMS administered 5 mg of versed via IV. Patient has a history of seizures, but last seizure was about 19 years ago. Patient was combative in his post ictal state, but is more cooperative now. No obvious injuries noted at this time.

## 2018-12-25 NOTE — ED Notes (Signed)
Nathan [name] (wife) was called and updated on patient's condition. Telephone number is [phone]. Wife would like to speak to hospitalist about patient's condition.

## 2018-12-25 NOTE — ED Notes (Signed)
Patient contact updated and patient provided phone to speak with wife. Patient given urinal and warm blankets. Denies any needs at this time. Will continue to monitor.

## 2018-12-25 NOTE — ED Notes (Signed)
Pt is still complaining of nausea. Pt has emesis bag at bedside and is attempting to vomit

## 2018-12-25 NOTE — Telephone Encounter (Signed)
Good morning. The patient wife called  in stated that her husband has been admitted to the hospital this morning. She stated that he got sick off the first dose of prep and has been throwing up ever since. Patient was schedule for a 10:00am procedure.

## 2018-12-25 NOTE — ED Notes (Signed)
Pt repositioned in bed due to being restless

## 2018-12-25 NOTE — ED Notes (Signed)
Patient called out and stated that he was nauseous; patient sitting up in bed and dry heaving currently. MD made aware and nausea medication requested.

## 2018-12-25 NOTE — ED Notes (Addendum)
EDP [place] and Corrie Dandy made aware of pt feeling nauseated

## 2018-12-26 LAB — CBC
HCT: 32.6 % — ABNORMAL LOW (ref 39.0–52.0)
Hemoglobin: 11.3 g/dL — ABNORMAL LOW (ref 13.0–17.0)
MCH: 32.6 pg (ref 26.0–34.0)
MCHC: 34.7 g/dL (ref 30.0–36.0)
MCV: 93.9 fL (ref 80.0–100.0)
Platelets: 139 10*3/uL — ABNORMAL LOW (ref 150–400)
RBC: 3.47 MIL/uL — ABNORMAL LOW (ref 4.22–5.81)
RDW: 12.5 % (ref 11.5–15.5)
WBC: 5.7 10*3/uL (ref 4.0–10.5)
nRBC: 0 % (ref 0.0–0.2)

## 2018-12-26 LAB — LACTIC ACID, PLASMA: Lactic Acid, Venous: 0.9 mmol/L (ref 0.5–1.9)

## 2018-12-26 LAB — COMPREHENSIVE METABOLIC PANEL
ALT: 46 U/L — ABNORMAL HIGH (ref 0–44)
AST: 112 U/L — ABNORMAL HIGH (ref 15–41)
Albumin: 3.3 g/dL — ABNORMAL LOW (ref 3.5–5.0)
Alkaline Phosphatase: 61 U/L (ref 38–126)
Anion gap: 8 (ref 5–15)
BUN: 9 mg/dL (ref 8–23)
CO2: 29 mmol/L (ref 22–32)
Calcium: 7.7 mg/dL — ABNORMAL LOW (ref 8.9–10.3)
Chloride: 97 mmol/L — ABNORMAL LOW (ref 98–111)
Creatinine, Ser: 0.68 mg/dL (ref 0.61–1.24)
GFR calc Af Amer: >60 mL/min (ref >60)
GFR calc non Af Amer: >60 mL/min (ref >60)
Glucose, Bld: 92 mg/dL (ref 70–99)
Potassium: 3.3 mmol/L — ABNORMAL LOW (ref 3.5–5.1)
Sodium: 134 mmol/L — ABNORMAL LOW (ref 135–145)
Total Bilirubin: 1 mg/dL (ref 0.3–1.2)
Total Protein: 4.9 g/dL — ABNORMAL LOW (ref 6.5–8.1)

## 2018-12-26 LAB — PARATHYROID HORMONE, INTACT (NO CA): PTH: 27 pg/mL (ref 15–65)

## 2018-12-26 LAB — VITAMIN D 25 HYDROXY (VIT D DEFICIENCY, FRACTURES): Vit D, 25-Hydroxy: 34.3 ng/mL (ref 30.0–100.0)

## 2018-12-26 LAB — NOVEL CORONAVIRUS, NAA (HOSP ORDER, SEND-OUT TO REF LAB; TAT 18-24 HRS): SARS-CoV-2, NAA: NOT DETECTED

## 2018-12-26 MED ORDER — DEXTROSE-NACL 5-0.45 % IV SOLN
INTRAVENOUS | Status: DC
  Administered 2018-12-26 – 2018-12-27 (×2): via INTRAVENOUS

## 2018-12-26 MED ORDER — POTASSIUM CHLORIDE CRYS ER 20 MEQ PO TBCR
40.0000 meq | EXTENDED_RELEASE_TABLET | Freq: Once | ORAL | Status: AC
  Administered 2018-12-26: 40 meq via ORAL
  Filled 2018-12-26: qty 2

## 2018-12-26 NOTE — Progress Notes (Signed)
PROGRESS NOTE  Nathan [name] NKN:[id number] DOB: Jul 13, 1946 DOA: 12/25/2018 PCP: [name], MD  HPI/Recap of past 24 hours: HPI from Dr Enid Baas is a 73 y.o. male with medical history significant of seizure disorder maintained on phenobarbital for 20+ years, last seizure was in 2003, osteoporosis was in his usual state of health until patient was prepping for colonoscopy, drank half of it and had been sick the whole day with numerous episodes of vomiting and watery stools as expected following the prep.  Patient reports that he had been feeling really sick and nauseous, overnight he had a seizure episode which is described as generalized tonic-clonic with urinary incontinence lasting close to a minute, he was given Versed by EMS, brought to the ER in a postictal, drowsy state. In the ED vital signs were stable, patient subsequently became more coherent, lab evaluation noted a sodium of 120 and calcium of 7.0. Patient admitted for further management   Today, pt reported still feeling nauseated, denies any new complaints.  We will continue to need IV Keppra as per neurology  Assessment/Plan: Active Problems:   Seizures ([place])  Breakthrough seizures No episode since admission Likely triggered by hyponatremia, also low phenobarbital levels (although been low for many years) CT head without any acute findings Admitting physician discussed with neurology, agreed not to change dose of phenobarbital, instead start Keppra 500 mg twice daily for 2 days, patient will not need this Keppra upon discharge Continue to correct metabolic abnormalities Seizure precautions, monitor closely  Hyponatremia Improving Likely due to colonoscopy prep with excessive vomiting and diarrhea Continue IV hydration  Hypocalcemia Ionized calcium pending PTH, vitamin D level, phosphorus all normal  Hypokalemia Replace PRN         Malnutrition Type:      Malnutrition Characteristics:       Nutrition Interventions:       Estimated body mass index is 21.22 kg/m as calculated from the following:   Height as of this encounter: 5\' 11"  (1.803 m).   Weight as of this encounter: 69 kg.     Code Status: Full  Family Communication: None at bedside  Disposition Plan: Home likely 12/27/2018   Consultants:  Admitting physician spoke to neurology  Procedures:  None  Antimicrobials:  None  DVT prophylaxis: Lovenox   Objective: Vitals:   12/25/18 0754 12/25/18 1734 12/25/18 2313 12/26/18 0445  BP: (!) 109/53 (!) 100/39 (!) 91/52 (!) 93/47  Pulse: 62 (!) 57 (!) 50 (!) 51  Resp: 16 16 14 14   Temp: 98.9 F (37.2 C) 99.4 F (37.4 C) 98.7 F (37.1 C) 98.9 F (37.2 C)  TempSrc: Oral Oral Oral Oral  SpO2: 94% 96% 97% 97%  Weight: 69 kg     Height: 5\' 11"  (1.803 m)       Intake/Output Summary (Last 24 hours) at 12/26/2018 1339 Last data filed at 12/26/2018 1225 Gross per 24 hour  Intake 1026.86 ml  Output 2550 ml  Net -1523.14 ml   Filed Weights   12/25/18 0754  Weight: 69 kg    Exam:  General: NAD   Cardiovascular: S1, S2 present  Respiratory: CTAB  Abdomen: Soft, nontender, nondistended, bowel sounds present  Musculoskeletal: No bilateral pedal edema noted  Skin: Normal  Psychiatry: Normal mood   Data Reviewed: CBC: Recent Labs  Lab 12/25/18 0430 12/25/18 0921 12/26/18 0557  WBC 4.9 7.0 5.7  NEUTROABS 4.0  --   --   HGB 12.7* 11.8* 11.3*  HCT 35.4* 33.6* 32.6*  MCV 92.4 93.3 93.9  PLT 154 141* 144*   Basic Metabolic Panel: Recent Labs  Lab 12/25/18 0430 12/25/18 0921 12/25/18 1325 12/25/18 2029 12/26/18 0557  NA 120* 123* 124* 126* 134*  K 3.6 3.6 3.4* 3.5 3.3*  CL 82* 87* 88* 93* 97*  CO2 25 26 27 27 29   GLUCOSE 161* 122* 112* 108* 92  BUN 11 10 10 9 9   CREATININE 0.54* 0.55* 0.59* 0.67 0.68  CALCIUM 7.0* 7.5* 7.9* 8.1* 7.7*  PHOS  --  3.2  --   --   --    GFR: Estimated Creatinine Clearance: 82.7 mL/min  (by C-G formula based on SCr of 0.68 mg/dL). Liver Function Tests: Recent Labs  Lab 12/25/18 0921 12/26/18 0557  AST 50* 112*  ALT 33 46*  ALKPHOS 63 61  BILITOT 1.1 1.0  PROT 5.2* 4.9*  ALBUMIN 3.6 3.3*   No results for input(s): LIPASE, AMYLASE in the last 168 hours. No results for input(s): AMMONIA in the last 168 hours. Coagulation Profile: No results for input(s): INR, PROTIME in the last 168 hours. Cardiac Enzymes: No results for input(s): CKTOTAL, CKMB, CKMBINDEX, TROPONINI in the last 168 hours. BNP (last 3 results) No results for input(s): PROBNP in the last 8760 hours. HbA1C: No results for input(s): HGBA1C in the last 72 hours. CBG: No results for input(s): GLUCAP in the last 168 hours. Lipid Profile: No results for input(s): CHOL, HDL, LDLCALC, TRIG, CHOLHDL, LDLDIRECT in the last 72 hours. Thyroid Function Tests: No results for input(s): TSH, T4TOTAL, FREET4, T3FREE, THYROIDAB in the last 72 hours. Anemia Panel: No results for input(s): VITAMINB12, FOLATE, FERRITIN, TIBC, IRON, RETICCTPCT in the last 72 hours. Urine analysis:    Component Value Date/Time   COLORURINE YELLOW 12/25/2018 0914   APPEARANCEUR CLEAR 12/25/2018 0914   LABSPEC 1.013 12/25/2018 0914   PHURINE 7.0 12/25/2018 0914   GLUCOSEU NEGATIVE 12/25/2018 0914   GLUCOSEU NEGATIVE 01/22/2011 0738   HGBUR NEGATIVE 12/25/2018 0914   HGBUR negative 01/23/2010 1030   BILIRUBINUR NEGATIVE 12/25/2018 0914   BILIRUBINUR neg 02/05/2017 1144   KETONESUR 5 (A) 12/25/2018 0914   PROTEINUR 30 (A) 12/25/2018 0914   UROBILINOGEN 0.2 02/05/2017 1144   UROBILINOGEN 0.2 11/23/2014 0713   NITRITE NEGATIVE 12/25/2018 0914   LEUKOCYTESUR NEGATIVE 12/25/2018 0914   Sepsis Labs: @LABRCNTIP (procalcitonin:4,lacticidven:4)  ) Recent Results (from the past 240 hour(s))  Novel Coronavirus,NAA,(SEND-OUT TO REF LAB - TAT 24-48 hrs); Hosp Order     Status: None   Collection Time: 12/25/18  6:11 AM   Specimen:  Nasopharyngeal Swab; Respiratory  Result Value Ref Range Status   SARS-CoV-2, NAA NOT DETECTED NOT DETECTED Final    Comment: (NOTE) This test was developed and its performance characteristics determined by [employer]. This test has not been FDA cleared or approved. This test has been authorized by FDA under an Emergency Use Authorization (EUA). This test is only authorized for the duration of time the declaration that circumstances exist justifying the authorization of the emergency use of in vitro diagnostic tests for detection of SARS-CoV-2 virus and/or diagnosis of COVID-19 infection under section 564(b)(1) of the Act, 21 U.S.C. 818HUD-1(S)(9), unless the authorization is terminated or revoked sooner. When diagnostic testing is negative, the possibility of a false negative result should be considered in the context of a patient's recent exposures and the presence of clinical signs and symptoms consistent with COVID-19. An individual without symptoms of COVID-19 and who is not  shedding SARS-CoV-2 virus would expect to have a negative (not detected) result in this assay. Performed  At: [facility] [place], [place] [id number] Rush Farmer MD WG:[id number]    [place]  Final    Comment: Performed at [facility] [address], [place], [place] [address]      Studies: No results found.  Scheduled Meds: . enoxaparin (LOVENOX) injection  40 mg Subcutaneous Q24H  . phenobarbital  64.8 mg Oral QHS    Continuous Infusions: . dextrose 5 % and 0.45% NaCl 75 mL/hr at 12/26/18 1107  . levETIRAcetam 500 mg (12/26/18 1111)     LOS: 1 day     Alma Friendly, MD [facility]  If 7PM-7AM, please contact night-coverage [email or web address] 12/26/2018, 1:39 PM

## 2018-12-27 DIAGNOSIS — E876 Hypokalemia: Secondary | ICD-10-CM

## 2018-12-27 LAB — CBC WITH DIFFERENTIAL/PLATELET
Abs Immature Granulocytes: 0.01 10*3/uL (ref 0.00–0.07)
Basophils Absolute: 0.1 10*3/uL (ref 0.0–0.1)
Basophils Relative: 1 %
Eosinophils Absolute: 0.1 10*3/uL (ref 0.0–0.5)
Eosinophils Relative: 1 %
HCT: 41.1 % (ref 39.0–52.0)
Hemoglobin: 13.7 g/dL (ref 13.0–17.0)
Immature Granulocytes: 0 %
Lymphocytes Relative: 26 %
Lymphs Abs: 1.3 10*3/uL (ref 0.7–4.0)
MCH: 32.2 pg (ref 26.0–34.0)
MCHC: 33.3 g/dL (ref 30.0–36.0)
MCV: 96.5 fL (ref 80.0–100.0)
Monocytes Absolute: 1 10*3/uL (ref 0.1–1.0)
Monocytes Relative: 19 %
Neutro Abs: 2.7 10*3/uL (ref 1.7–7.7)
Neutrophils Relative %: 53 %
Platelets: 154 10*3/uL (ref 150–400)
RBC: 4.26 MIL/uL (ref 4.22–5.81)
RDW: 12.9 % (ref 11.5–15.5)
WBC: 5.1 10*3/uL (ref 4.0–10.5)
nRBC: 0 % (ref 0.0–0.2)

## 2018-12-27 LAB — BASIC METABOLIC PANEL
Anion gap: 7 (ref 5–15)
Anion gap: 6 (ref 5–15)
BUN: 7 mg/dL — ABNORMAL LOW (ref 8–23)
BUN: 6 mg/dL — ABNORMAL LOW (ref 8–23)
CO2: 26 mmol/L (ref 22–32)
CO2: 26 mmol/L (ref 22–32)
Calcium: 7.5 mg/dL — ABNORMAL LOW (ref 8.9–10.3)
Calcium: 7.8 mg/dL — ABNORMAL LOW (ref 8.9–10.3)
Chloride: 94 mmol/L — ABNORMAL LOW (ref 98–111)
Chloride: 107 mmol/L (ref 98–111)
Creatinine, Ser: 0.57 mg/dL — ABNORMAL LOW (ref 0.61–1.24)
Creatinine, Ser: 0.55 mg/dL — ABNORMAL LOW (ref 0.61–1.24)
GFR calc Af Amer: >60 mL/min (ref >60)
GFR calc Af Amer: >60 mL/min (ref >60)
GFR calc non Af Amer: >60 mL/min (ref >60)
GFR calc non Af Amer: >60 mL/min (ref >60)
Glucose, Bld: 101 mg/dL — ABNORMAL HIGH (ref 70–99)
Glucose, Bld: 101 mg/dL — ABNORMAL HIGH (ref 70–99)
Potassium: 3.4 mmol/L — ABNORMAL LOW (ref 3.5–5.1)
Potassium: 3.7 mmol/L (ref 3.5–5.1)
Sodium: 127 mmol/L — ABNORMAL LOW (ref 135–145)
Sodium: 139 mmol/L (ref 135–145)

## 2018-12-27 LAB — CALCIUM, IONIZED: Calcium, Ionized, Serum: 4.5 mg/dL (ref 4.5–5.6)

## 2018-12-27 MED ORDER — SODIUM CHLORIDE 0.9 % IV SOLN
INTRAVENOUS | Status: DC
  Administered 2018-12-27: 10:00:00 via INTRAVENOUS

## 2018-12-27 NOTE — Discharge Summary (Signed)
Discharge Summary  Nathan [name] YPP:[id number] DOB: [birth year]  PCP: [name], MD  Admit date: 12/25/2018 Discharge date: 12/27/2018  Time spent: 35 mins   Recommendations for Outpatient Follow-up:  1. PCP with repeat labs  Discharge Diagnoses:  Active Hospital Problems   Diagnosis Date Noted  . Seizures ([place]) 12/25/2018    Resolved Hospital Problems  No resolved problems to display.    Discharge Condition: Stable  Diet recommendation: As tolerated  Vitals:   12/27/18 0403 12/27/18 1452  BP: (!) 114/59 110/62  Pulse:  99  Resp: 18 14  Temp: 98.5 F (36.9 C) 98.5 F (36.9 C)  SpO2: 96% 100%    History of present illness:  Nathan [name] a 73 y.o.malewith medical history significant ofseizure disorder maintained on phenobarbital for 20+ years, last seizure was in 2003, osteoporosis was in his usual state of health until patient was prepping for colonoscopy, drank half of it and had been sick the whole day with numerous episodes of vomiting and watery stools as expected following the prep.Patient reports that he had been feeling really sick and nauseous,overnight he had a seizure episode which is described as generalized tonic-clonic with urinary incontinence lasting close to a minute, he was given Versed by EMS, brought to the ER in a postictal, drowsy state. In the ED vital signs were stable, patient subsequently became more coherent, lab evaluation noted a sodium of 120 and calcium of 7.0. Patient admitted for further management.   Today, patient reported feeling much better, denies any new complaints.  Very eager to be discharged.  No further episodes of seizures.  Advised to follow-up with primary care in 1 week with repeat labs.  Advised to stay hydrated and compliance to his phenobarbital.  Hospital Course:  Active Problems:   Seizures ([place])  Breakthrough seizures No episode since admission Likely triggered by hyponatremia, also low phenobarbital  levels (although been low for many years) CT head without any acute findings Admitting physician discussed with neurology, agreed not to change dose of phenobarbital, instead start Keppra 500 mg twice daily for 2 days, but advised that patient will not need this Keppra upon discharge Seizure precautions  Hyponatremia Resolved Likely due to colonoscopy prep with excessive vomiting and diarrhea Advised to stay hydrated Follow-up with PCP with repeat labs in 1 week  Hypocalcemia Ionized calcium pending PTH, vitamin D level, phosphorus all normal PCP to follow-up with repeat labs  Hypokalemia Resolved         Malnutrition Type:      Malnutrition Characteristics:      Nutrition Interventions:      Estimated body mass index is 21.22 kg/m as calculated from the following:   Height as of this encounter: 5\' 11"  (1.803 m).   Weight as of this encounter: 69 kg.    Procedures:  None  Consultations:  Spoke to neurology  Discharge Exam: BP 110/62 (BP Location: Right Arm)   Pulse 99   Temp 98.5 F (36.9 C) (Oral)   Resp 14   Ht 5\' 11"  (1.803 m)   Wt 69 kg   SpO2 100%   BMI 21.22 kg/m   General: NAD Cardiovascular: S1 S2 present Respiratory: CTA B  Discharge Instructions You were cared for by a hospitalist during your hospital stay. If you have any questions about your discharge medications or the care you received while you were in the hospital after you are discharged, you can call the unit and asked to speak with the  hospitalist on call if the hospitalist that took care of you is not available. Once you are discharged, your primary care physician will handle any further medical issues. Please note that NO REFILLS for any discharge medications will be authorized once you are discharged, as it is imperative that you return to your primary care physician (or establish a relationship with a primary care physician if you do not have one) for your aftercare needs  so that they can reassess your need for medications and monitor your lab values.   Allergies as of 12/27/2018      Reactions   Carbamazepine    REACTION: fatigue, sleepy   Ciprofloxacin    Per pt, "thinks caused rash"   Clindamycin/lincomycin Diarrhea   Metronidazole    REACTION: GI upset   Penicillins    REACTION: childhood, hives?   Phenytoin    REACTION: rash, leg swelling   Sulfamethoxazole Other (See Comments)   Trimethoprim Other (See Comments)   Bactrim Other (See Comments)   Caused headaches, that progressively worsened when on medication. Once off, they went away.   Phenytoin Sodium Extended Swelling, Rash   Swelling of ankles. Happened years ago per patient.      Medication List    TAKE these medications   cholecalciferol 1000 units tablet Commonly known as: VITAMIN D Take 1,000 Units by mouth daily.   Co Q 10 60 MG Caps Take 1 tablet by mouth daily.   Cod Liver Oil Oil Take 5 mLs by mouth daily.   OVER THE COUNTER MEDICATION AlgeaCal   OVER THE COUNTER MEDICATION Strontium citrate   PHENObarbital 60 MG tablet Commonly known as: LUMINAL Take 1 tablet (60 mg total) by mouth at bedtime.   PROBIOTIC DAILY PO Take by mouth daily.   Similasan Dry Eye Relief Soln Apply 1 drop to eye daily as needed. Dry Eyes   vitamin B-12 500 MCG tablet Commonly known as: CYANOCOBALAMIN Take 1,000 mcg by mouth daily.   vitamin C 500 MG tablet Commonly known as: ASCORBIC ACID Take 500 mg by mouth 3 (three) times daily.      Allergies  Allergen Reactions  . Carbamazepine     REACTION: fatigue, sleepy  . Ciprofloxacin     Per pt, "thinks caused rash"  . Clindamycin/Lincomycin Diarrhea  . Metronidazole     REACTION: GI upset  . Penicillins     REACTION: childhood, hives?  . Phenytoin     REACTION: rash, leg swelling  . Sulfamethoxazole Other (See Comments)  . Trimethoprim Other (See Comments)  . Bactrim Other (See Comments)    Caused headaches, that  progressively worsened when on medication. Once off, they went away.  . Phenytoin Sodium Extended Swelling and Rash    Swelling of ankles. Happened years ago per patient.   Follow-up Information    Burchette, Alinda Sierras, MD. Schedule an appointment as soon as possible for a visit in 1 week(s).   Specialty: Family Medicine Contact information: [place] [address] [phone]            The results of significant diagnostics from this hospitalization (including imaging, microbiology, ancillary and laboratory) are listed below for reference.    Significant Diagnostic Studies: Ct Head Wo Contrast  Result Date: 12/25/2018 CLINICAL DATA:  Seizure. EXAM: CT HEAD WITHOUT CONTRAST TECHNIQUE: Contiguous axial images were obtained from the base of the skull through the vertex without intravenous contrast. COMPARISON:  None. FINDINGS: Brain: There is no evidence of acute  infarct, intracranial hemorrhage, mass, midline shift, or extra-axial fluid collection. The ventricles and sulci are normal. Patchy cerebral white matter hypodensities are nonspecific but compatible with mild-to-moderate chronic small vessel ischemic disease. Vascular: Calcified atherosclerosis at the skull base. No hyperdense vessel. Skull: No fracture focal osseous lesion. Sinuses/Orbits: Mild left maxillary sinus mucosal thickening. Clear mastoid air cells. Unremarkable orbits. Other: None. IMPRESSION: 1. No evidence of acute intracranial abnormality. 2. Mild-to-moderate chronic small vessel ischemic disease. Electronically Signed   By: Logan Bores M.D.   On: 12/25/2018 07:45    Microbiology: Recent Results (from the past 240 hour(s))  Novel Coronavirus,NAA,(SEND-OUT TO REF LAB - TAT 24-48 hrs); Hosp Order     Status: None   Collection Time: 12/25/18  6:11 AM   Specimen: Nasopharyngeal Swab; Respiratory  Result Value Ref Range Status   SARS-CoV-2, NAA NOT DETECTED NOT DETECTED Final    Comment: (NOTE)  This test was developed and its performance characteristics determined by [employer]. This test has not been FDA cleared or approved. This test has been authorized by FDA under an Emergency Use Authorization (EUA). This test is only authorized for the duration of time the declaration that circumstances exist justifying the authorization of the emergency use of in vitro diagnostic tests for detection of SARS-CoV-2 virus and/or diagnosis of COVID-19 infection under section 564(b)(1) of the Act, 21 U.S.C. 397QBH-4(L)(9), unless the authorization is terminated or revoked sooner. When diagnostic testing is negative, the possibility of a false negative result should be considered in the context of a patient's recent exposures and the presence of clinical signs and symptoms consistent with COVID-19. An individual without symptoms of COVID-19 and who is not shedding SARS-CoV-2 virus would expect to have a negative (not detected) result in this assay. Performed  At: [facility] [place], [place] [id number] Rush Farmer MD DZ:[id number]    [place]  Final    Comment: Performed at [facility] [address], [place], [place] [address]     Labs: Basic Metabolic Panel: Recent Labs  Lab 12/25/18 0921 12/25/18 1325 12/25/18 2029 12/26/18 0557 12/27/18 0428 12/27/18 1550  NA 123* 124* 126* 134* 127* 139  K 3.6 3.4* 3.5 3.3* 3.4* 3.7  CL 87* 88* 93* 97* 94* 107  CO2 26 27 27 29 26 26   GLUCOSE 122* 112* 108* 92 101* 101*  BUN 10 10 9 9  7* 6*  CREATININE 0.55* 0.59* 0.67 0.68 0.57* 0.55*  CALCIUM 7.5* 7.9* 8.1* 7.7* 7.5* 7.8*  PHOS 3.2  --   --   --   --   --    Liver Function Tests: Recent Labs  Lab 12/25/18 0921 12/26/18 0557  AST 50* 112*  ALT 33 46*  ALKPHOS 63 61  BILITOT 1.1 1.0  PROT 5.2* 4.9*  ALBUMIN 3.6 3.3*   No results for input(s): LIPASE, AMYLASE in the last 168 hours. No results for  input(s): AMMONIA in the last 168 hours. CBC: Recent Labs  Lab 12/25/18 0430 12/25/18 0921 12/26/18 0557 12/27/18 0428  WBC 4.9 7.0 5.7 5.1  NEUTROABS 4.0  --   --  2.7  HGB 12.7* 11.8* 11.3* 13.7  HCT 35.4* 33.6* 32.6* 41.1  MCV 92.4 93.3 93.9 96.5  PLT 154 141* 139* 154   Cardiac Enzymes: No results for input(s): CKTOTAL, CKMB, CKMBINDEX, TROPONINI in the last 168 hours. BNP: BNP (last 3 results) No results for input(s): BNP in the last 8760 hours.  ProBNP (last 3  results) No results for input(s): PROBNP in the last 8760 hours.  CBG: No results for input(s): GLUCAP in the last 168 hours.     Signed:  Alma Friendly, MD [facility] 12/27/2018, 4:34 PM

## 2018-12-30 ENCOUNTER — Telehealth: Payer: Self-pay | Admitting: Internal Medicine

## 2018-12-30 ENCOUNTER — Telehealth: Payer: Self-pay | Admitting: *Deleted

## 2018-12-30 NOTE — Telephone Encounter (Signed)
Yes, I do recommend that he be seen to discuss recent events with attempted colonoscopy prep Virtual or inperson next available, whichever he prefers Thanks

## 2018-12-30 NOTE — Telephone Encounter (Signed)
Dr Pyrtle-please advise.... 

## 2018-12-30 NOTE — Telephone Encounter (Signed)
The pt has been scheduled for a telephone visit per pt request.

## 2018-12-30 NOTE — Telephone Encounter (Signed)
Transition Care Management Follow-up Telephone Call   Date discharged? 12/27/2018   How have you been since you were released from the hospital? "Donnald Garre been doing okay"    Do you understand why you were in the hospital? Yes; " Idrank the first part of the prep for a colonoscopy and I had a seizure"   Do you understand the discharge instructions? yes   Where were you discharged to? Home    Items Reviewed:  Medications reviewed: yes  Allergies reviewed: yes  Dietary changes reviewed: N/A   Referrals reviewed: N/A    Functional Questionnaire:   Activities of Daily Living (ADLs):   He states they are independent in the following: ambulation, bathing and hygiene, feeding, continence, grooming, toileting and dressing States they require assistance with the following: N/A    Any transportation issues/concerns?: no   Any patient concerns? no   Confirmed importance and date/time of follow-up visits scheduled yes  Provider Appointment booked with Dr. Elease Hashimoto on 12/31/2018 at [place] (virtual)  Confirmed with patient if condition begins to worsen call PCP or go to the ER.  Patient was given the office number and encouraged to call back with question or concerns.  : yes

## 2018-12-30 NOTE — Telephone Encounter (Signed)
Patient called said that he would like to know if he should schedule an appt to discuss the situation that happened when prepping for his colonoscopy that was on scheduled on 6-11. Patient was admitted to the hospital afte taking the first part of the prep and would like to know if he needs to still have it done.

## 2018-12-31 ENCOUNTER — Other Ambulatory Visit: Payer: Self-pay

## 2018-12-31 ENCOUNTER — Ambulatory Visit (INDEPENDENT_AMBULATORY_CARE_PROVIDER_SITE_OTHER): Payer: Medicare Other | Admitting: Family Medicine

## 2018-12-31 DIAGNOSIS — E871 Hypo-osmolality and hyponatremia: Secondary | ICD-10-CM

## 2018-12-31 DIAGNOSIS — Z125 Encounter for screening for malignant neoplasm of prostate: Secondary | ICD-10-CM

## 2018-12-31 DIAGNOSIS — R569 Unspecified convulsions: Secondary | ICD-10-CM

## 2018-12-31 NOTE — Progress Notes (Signed)
Patient ID: Nathan [name], male   DOB: 1945/09/16, 73 y.o.   MRN: [id number]  This visit type was conducted due to national recommendations for restrictions regarding the COVID-19 pandemic in an effort to limit this patient's exposure and mitigate transmission in our community.   Virtual Visit via Video Note  I connected with Nathan [name] on 12/31/18 at  8:00 AM EDT by a video enabled telemedicine application and verified that I am speaking with the correct person using two identifiers.  Location patient: home Location provider:work or home office Persons participating in the virtual visit: patient, provider  I discussed the limitations of evaluation and management by telemedicine and the availability of in person appointments. The patient expressed understanding and agreed to proceed.   HPI: Patient seen for hospital follow-up.  Patient had recent seizure following electrolyte disturbance which occurred during preparation for colonoscopy.  He had not had prior seizure for over 20 years since 2003.  He has been maintained over this time on phenobarbital.  He recalls having some nausea and vomiting on the night of this prep and was feeling very poorly around 1:30 AM with recurrent vomiting.  Around 2 AM he had what sounded like tonic-clonic seizure.  He was given Versed and transferred by EMS.  CT head no acute findings.  Calcium 7.0 with sodium 120.  He also had mild hypokalemia during admission.  Electrolytes improved.  Ionized calcium was low limit of normal.  He had no breakthrough seizure after admission  Neurology was consulted and he had 2 days of Keppra discharged on usual dose of phenobarbital.  He has had no further nausea or vomiting.  No diarrhea.  Patient had other labs including PTH, vitamin D level, and phosphorus which were all normal.  Past Medical History:  Diagnosis Date  . Colon polyps    Sessile Serrated adenomas  . DYSPNEA 02/09/2009  . RESTLESS LEGS SYNDROME 02/28/2009  .  SEIZURE DISORDER 02/09/2009   last seisure Sep 10, 2001   Past Surgical History:  Procedure Laterality Date  . APPENDECTOMY  06/01/11  . COLONOSCOPY    . HERNIA REPAIR Right 02/11/09   dr. Donella Stade -[facility]  . LAPAROSCOPIC APPENDECTOMY  06/01/2011   Procedure: APPENDECTOMY LAPAROSCOPIC;  Surgeon: Shann Medal, MD;  Location: [facility];  Service: General;  Laterality: N/A;  . POLYPECTOMY      reports that he has never smoked. He has never used smokeless tobacco. He reports that he does not drink alcohol or use drugs. family history includes Cancer in his father and mother; Diabetes Mellitus II in his father. Allergies  Allergen Reactions  . Carbamazepine     REACTION: fatigue, sleepy  . Ciprofloxacin     Per pt, "thinks caused rash"  . Clindamycin/Lincomycin Diarrhea  . Metronidazole     REACTION: GI upset  . Penicillins     REACTION: childhood, hives?  . Phenytoin     REACTION: rash, leg swelling  . Sulfamethoxazole Other (See Comments)  . Trimethoprim Other (See Comments)  . Bactrim Other (See Comments)    Caused headaches, that progressively worsened when on medication. Once off, they went away.  . Phenytoin Sodium Extended Swelling and Rash    Swelling of ankles. Happened years ago per patient.      ROS: See pertinent positives and negatives per HPI.   EXAM:  VITALS per patient if applicable:  GENERAL: alert, oriented, appears well and in no acute distress  HEENT: atraumatic, conjunttiva clear, no obvious abnormalities  on inspection of external nose and ears  NECK: normal movements of the head and neck  LUNGS: on inspection no signs of respiratory distress, breathing rate appears normal, no obvious gross SOB, gasping or wheezing  CV: no obvious cyanosis  MS: moves all visible extremities without noticeable abnormality  PSYCH/NEURO: pleasant and cooperative, no obvious depression or anxiety, speech and thought processing grossly intact  ASSESSMENT AND  PLAN:  Discussed the following assessment and plan:  #1 recent seizure which occurred during preparation for colonoscopy secondary to electrolyte disturbance with sodium 120 and calcium level 7.0.  Stable since hospitalization with no recurrence  -Repeat electrolytes with comprehensive metabolic panel and he will return tomorrow for that -We have recommended no driving for 6 months even though is very unlikely he will have another seizure as well as electrolyte stable since he has not had any in many years  #2 recent electrolyte disturbance with hyponatremia related to colonoscopy prep.  He does not take any thiazides or other medications that would likely be affecting this  -Recheck electrolytes as above  #3 patient requesting PSA screening.     I discussed the assessment and treatment plan with the patient. The patient was provided an opportunity to ask questions and all were answered. The patient agreed with the plan and demonstrated an understanding of the instructions.   The patient was advised to call back or seek an in-person evaluation if the symptoms worsen or if the condition fails to improve as anticipated.   Carolann Littler, MD

## 2019-01-01 ENCOUNTER — Other Ambulatory Visit (INDEPENDENT_AMBULATORY_CARE_PROVIDER_SITE_OTHER): Payer: Medicare Other

## 2019-01-01 ENCOUNTER — Other Ambulatory Visit: Payer: Self-pay

## 2019-01-01 DIAGNOSIS — E871 Hypo-osmolality and hyponatremia: Secondary | ICD-10-CM

## 2019-01-01 DIAGNOSIS — Z125 Encounter for screening for malignant neoplasm of prostate: Secondary | ICD-10-CM | POA: Diagnosis not present

## 2019-01-01 LAB — COMPREHENSIVE METABOLIC PANEL
ALT: 56 U/L — ABNORMAL HIGH (ref 0–53)
AST: 51 U/L — ABNORMAL HIGH (ref 0–37)
Albumin: 4.2 g/dL (ref 3.5–5.2)
Alkaline Phosphatase: 80 U/L (ref 39–117)
BUN: 13 mg/dL (ref 6–23)
CO2: 32 mEq/L (ref 19–32)
Calcium: 9.4 mg/dL (ref 8.4–10.5)
Chloride: 99 mEq/L (ref 96–112)
Creatinine, Ser: 0.69 mg/dL (ref 0.40–1.50)
GFR: 112.74 mL/min (ref >60.00)
Glucose, Bld: 94 mg/dL (ref 70–99)
Potassium: 4.5 mEq/L (ref 3.5–5.1)
Sodium: 139 mEq/L (ref 135–145)
Total Bilirubin: 0.5 mg/dL (ref 0.2–1.2)
Total Protein: 5.8 g/dL — ABNORMAL LOW (ref 6.0–8.3)

## 2019-01-01 LAB — PSA: PSA: 0.81 ng/mL (ref 0.10–4.00)

## 2019-01-06 ENCOUNTER — Encounter: Payer: Self-pay | Admitting: Family Medicine

## 2019-01-29 ENCOUNTER — Encounter: Payer: Self-pay | Admitting: Family Medicine

## 2019-01-30 ENCOUNTER — Other Ambulatory Visit: Payer: Self-pay

## 2019-01-30 DIAGNOSIS — R748 Abnormal levels of other serum enzymes: Secondary | ICD-10-CM

## 2019-02-05 ENCOUNTER — Other Ambulatory Visit: Payer: Self-pay

## 2019-02-05 ENCOUNTER — Encounter: Payer: Self-pay | Admitting: Family Medicine

## 2019-02-05 ENCOUNTER — Other Ambulatory Visit (INDEPENDENT_AMBULATORY_CARE_PROVIDER_SITE_OTHER): Payer: Medicare Other

## 2019-02-05 DIAGNOSIS — R748 Abnormal levels of other serum enzymes: Secondary | ICD-10-CM | POA: Diagnosis not present

## 2019-02-05 LAB — HEPATIC FUNCTION PANEL
ALT: 33 U/L (ref 0–53)
AST: 25 U/L (ref 0–37)
Albumin: 4.5 g/dL (ref 3.5–5.2)
Alkaline Phosphatase: 87 U/L (ref 39–117)
Bilirubin, Direct: 0.1 mg/dL (ref 0.0–0.3)
Total Bilirubin: 0.6 mg/dL (ref 0.2–1.2)
Total Protein: 6.1 g/dL (ref 6.0–8.3)

## 2019-02-09 ENCOUNTER — Encounter: Payer: Self-pay | Admitting: *Deleted

## 2019-02-10 ENCOUNTER — Ambulatory Visit (INDEPENDENT_AMBULATORY_CARE_PROVIDER_SITE_OTHER): Payer: Medicare Other | Admitting: Internal Medicine

## 2019-02-10 ENCOUNTER — Encounter: Payer: Self-pay | Admitting: Internal Medicine

## 2019-02-10 VITALS — Ht 70.0 in | Wt 150.0 lb

## 2019-02-10 DIAGNOSIS — E871 Hypo-osmolality and hyponatremia: Secondary | ICD-10-CM

## 2019-02-10 DIAGNOSIS — Z86010 Personal history of colonic polyps: Secondary | ICD-10-CM | POA: Diagnosis not present

## 2019-02-10 DIAGNOSIS — Z860101 Personal history of adenomatous and serrated colon polyps: Secondary | ICD-10-CM

## 2019-02-10 DIAGNOSIS — G40909 Epilepsy, unspecified, not intractable, without status epilepticus: Secondary | ICD-10-CM

## 2019-02-10 NOTE — Patient Instructions (Addendum)
We will discontinue surveillance colonoscopy at your request. However, should you ever need colonoscopy due to GI symptoms in the future, we would consider hospital admission for colonoscopy preparation.  Suprep has been added to your allergy list.  Follow-up as needed.   If you are age 72 or older, your body mass index should be between 23-30. Your Body mass index is 21.52 kg/m. If this is out of the aforementioned range listed, please consider follow up with your Primary Care Provider.  If you are age 14 or younger, your body mass index should be between 19-25. Your Body mass index is 21.52 kg/m. If this is out of the aformentioned range listed, please consider follow up with your Primary Care Provider.

## 2019-02-10 NOTE — Progress Notes (Signed)
Patient ID: DAVIONE LENKER, male   DOB: Nov 23, 1945, 73 y.o.   MRN: [id number] HPI:  This service was provided via telemedicine.  Doximity app with AV communication. The patient was located at home The provider was located in provider's GI office. The patient did consent to this telephone visit and is aware of possible charges through their insurance for this visit.   The persons participating in this telemedicine service were the patient and I. Time spent on call: 12 minutes  Sumeet Geter is a 73 year old male with a history of seizure disorder, restless leg and adenomatous colon polyp who is seen in follow-up.  He has not been seen here since 2015 when he had his colonoscopy.  This colonoscopy revealed 2 polyps 1 of which was tubular adenoma.  He was scheduled for colonoscopy on 12/26/2018 but developed a tonic-clonic seizure during his prep.  He reports that he took Suprep and within 30 minutes began to have diarrhea.  This caused about 4 hours of near constant diarrhea after which he developed nausea and vomiting.  He spoke to Dr. Henrene Pastor the on-call provider who stated that he should take no more of the prep.  He went to bed about 1:30 AM and his wife found him to be having a tonic-clonic seizure around 2 AM.  He was taken to the emergency room where he was found to have significant hyponatremia with a serum sodium of 120.  He received Keppra for several doses and his phenobarbital was continued.  This was his first seizure in 17 years.  His liver enzymes were slightly elevated during this hospitalization as well.  He was discharged on phenobarbital.  He followed up with primary care and his serum sodium had returned to normal.  He had repeat hepatic function panel recently and his liver enzymes also returned to normal.  He reports that he never wishes to undergo colonoscopy again.  He feels lucky to be alive.  He denies any recent change in bowel habits.  No blood in his stool or melena.  No abdominal  pain.   Past Medical History:  Diagnosis Date  . Colon polyps    Sessile Serrated adenomas  . DYSPNEA 02/09/2009  . RESTLESS LEGS SYNDROME 02/28/2009  . SEIZURE DISORDER 02/09/2009   last seisure Sep 10, 2001    Past Surgical History:  Procedure Laterality Date  . COLONOSCOPY    . HERNIA REPAIR Right 02/11/09   dr. Donella Stade -[facility]  . HERNIA REPAIR Left   . LAPAROSCOPIC APPENDECTOMY  06/01/2011   Procedure: APPENDECTOMY LAPAROSCOPIC;  Surgeon: Shann Medal, MD;  Location: [facility];  Service: General;  Laterality: N/A;  . POLYPECTOMY      Outpatient Medications Prior to Visit  Medication Sig Dispense Refill  . cholecalciferol (VITAMIN D) 1000 UNITS tablet Take 1,000 Units by mouth daily.     . Cod Liver Oil OIL Take 5 mLs by mouth daily.      . Coenzyme Q10 (CO Q 10) 60 MG CAPS Take 1 tablet by mouth daily.      . cyanocobalamin 500 MCG tablet Take 1,000 mcg by mouth daily.    . Homeopathic Products (SIMILASAN DRY EYE RELIEF) SOLN Apply 1 drop to eye daily as needed. Dry Eyes     . OVER THE COUNTER MEDICATION AlgeaCal    . OVER THE COUNTER MEDICATION Strontium citrate    . PHENObarbital (LUMINAL) 60 MG tablet Take 1 tablet (60 mg total) by mouth at bedtime. 90 tablet  1  . Probiotic Product (PROBIOTIC DAILY PO) Take by mouth daily.    . vitamin C (ASCORBIC ACID) 500 MG tablet Take 500 mg by mouth 3 (three) times daily.       No facility-administered medications prior to visit.     Allergies  Allergen Reactions  . Carbamazepine     REACTION: fatigue, sleepy  . Ciprofloxacin     Per pt, "thinks caused rash"  . Clindamycin/Lincomycin Diarrhea  . Metronidazole     REACTION: GI upset  . Penicillins     REACTION: childhood, hives?  . Phenytoin     REACTION: rash, leg swelling  . Sulfamethoxazole Other (See Comments)  . Trimethoprim Other (See Comments)  . Bactrim Other (See Comments)    Caused headaches, that progressively worsened when on medication. Once off, they went  away.  . Phenytoin Sodium Extended Swelling and Rash    Swelling of ankles. Happened years ago per patient.    Family History  Problem Relation Age of Onset  . Cancer Mother        unknown type but thinks "male organs"  . Cancer Father        multiple myeloma  . Diabetes Mellitus II Father   . Stroke Neg Hx        grandparent  . Colon cancer Neg Hx   . Esophageal cancer Neg Hx   . Rectal cancer Neg Hx   . Stomach cancer Neg Hx   . Pancreatic cancer Neg Hx   . Liver disease Neg Hx     Social History   Tobacco Use  . Smoking status: Never Smoker  . Smokeless tobacco: Never Used  Substance Use Topics  . Alcohol use: No    Alcohol/week: 0.0 standard drinks  . Drug use: No    ROS: As per history of present illness, otherwise negative  Ht '5\' 10"'$  (1.778 m)   Wt 150 lb (68 kg)   BMI 21.52 kg/m  No physical exam today, virtual visit  RELEVANT LABS AND IMAGING: CBC    Component Value Date/Time   WBC 5.1 12/27/2018 0428   RBC 4.26 12/27/2018 0428   HGB 13.7 12/27/2018 0428   HCT 41.1 12/27/2018 0428   PLT 154 12/27/2018 0428   MCV 96.5 12/27/2018 0428   MCH 32.2 12/27/2018 0428   MCHC 33.3 12/27/2018 0428   RDW 12.9 12/27/2018 0428   LYMPHSABS 1.3 12/27/2018 0428   MONOABS 1.0 12/27/2018 0428   EOSABS 0.1 12/27/2018 0428   BASOSABS 0.1 12/27/2018 0428    CMP     Component Value Date/Time   NA 139 01/01/2019 0839   K 4.5 01/01/2019 0839   CL 99 01/01/2019 0839   CO2 32 01/01/2019 0839   GLUCOSE 94 01/01/2019 0839   BUN 13 01/01/2019 0839   CREATININE 0.69 01/01/2019 0839   CALCIUM 9.4 01/01/2019 0839   PROT 6.1 02/05/2019 0835   ALBUMIN 4.5 02/05/2019 0835   AST 25 02/05/2019 0835   ALT 33 02/05/2019 0835   ALKPHOS 87 02/05/2019 0835   BILITOT 0.6 02/05/2019 0835   GFRNONAA >60 12/27/2018 1550   GFRAA >60 12/27/2018 1550    ASSESSMENT/PLAN:  73 year old male with a history of seizure disorder, restless leg and adenomatous colon polyp who is  seen in follow-up.   1.  History of adenomatous colon polyp/recent seizure due to hyponatremia induced by bowel preparation --he had significant nausea and vomiting associated with his bowel preparation which resulted in hyponatremia.  He had a tonic-clonic seizure and was admitted to the hospital.  He has fully recovered and is feeling well.  I certainly understand his hesitancy to undergo any additional bowel preparation for colonoscopy.  I am certainly glad that he is fully recovered and regret that he had to experience such a scary situation.  I think overall his risk of colon cancer is quite low given his low risk adenoma 5 years ago.  We will discontinue further surveillance colonoscopy.  We discussed if ever he develops symptoms warranting colonoscopy, I would consider admission to the hospital for close monitoring of serum sodium while prepping. --Discontinue surveillance colonoscopy --Suprep added to allergy list  2.  Seizure disorder --on phenobarbital.  Recent seizure hyponatremia induced  3.  Elevated liver enzymes --likely related to seizure activity.  Liver enzymes have normalized.  No further evaluation needed.  He can follow-up as needed.    BV:[id number], Alinda Sierras, Md [address] [place],  [place] [address]

## 2019-04-14 ENCOUNTER — Ambulatory Visit: Payer: Medicare Other

## 2019-04-20 ENCOUNTER — Other Ambulatory Visit: Payer: Self-pay

## 2019-04-20 ENCOUNTER — Ambulatory Visit (INDEPENDENT_AMBULATORY_CARE_PROVIDER_SITE_OTHER): Payer: Medicare Other

## 2019-04-20 DIAGNOSIS — Z23 Encounter for immunization: Secondary | ICD-10-CM | POA: Diagnosis not present

## 2019-05-15 ENCOUNTER — Other Ambulatory Visit: Payer: Self-pay | Admitting: Gastroenterology

## 2019-05-15 DIAGNOSIS — Z86010 Personal history of colonic polyps: Secondary | ICD-10-CM

## 2019-06-09 ENCOUNTER — Other Ambulatory Visit: Payer: Medicare Other

## 2019-06-09 ENCOUNTER — Other Ambulatory Visit: Payer: Self-pay | Admitting: Family Medicine

## 2019-06-09 ENCOUNTER — Encounter: Payer: Self-pay | Admitting: Family Medicine

## 2019-09-07 ENCOUNTER — Encounter: Payer: Self-pay | Admitting: Family Medicine

## 2019-09-10 DIAGNOSIS — S92425A Nondisplaced fracture of distal phalanx of left great toe, initial encounter for closed fracture: Secondary | ICD-10-CM | POA: Diagnosis not present

## 2019-09-10 MED ORDER — PHENOBARBITAL 60 MG PO TABS: 60.0000 mg | ORAL_TABLET | Freq: Every day | ORAL | 1 refills | Status: DC

## 2019-09-10 NOTE — Telephone Encounter (Signed)
rx sent to [facility] pharmacy as requested.

## 2019-09-14 ENCOUNTER — Other Ambulatory Visit: Payer: Self-pay

## 2019-09-14 ENCOUNTER — Telehealth (INDEPENDENT_AMBULATORY_CARE_PROVIDER_SITE_OTHER): Payer: Medicare PPO | Admitting: Family Medicine

## 2019-09-14 ENCOUNTER — Encounter: Payer: Self-pay | Admitting: Family Medicine

## 2019-09-14 DIAGNOSIS — M79605 Pain in left leg: Secondary | ICD-10-CM | POA: Diagnosis not present
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
## 2019-09-15 ENCOUNTER — Ambulatory Visit (INDEPENDENT_AMBULATORY_CARE_PROVIDER_SITE_OTHER): Payer: Medicare PPO | Admitting: Family Medicine

## 2019-09-15 ENCOUNTER — Encounter: Payer: Self-pay | Admitting: Family Medicine

## 2019-09-15 ENCOUNTER — Other Ambulatory Visit: Payer: Self-pay

## 2019-09-15 VITALS — BP 126/74 | HR 74 | Temp 97.5°F | Ht 70.0 in | Wt 148.0 lb

## 2019-09-15 DIAGNOSIS — S80212A Abrasion, left knee, initial encounter: Secondary | ICD-10-CM | POA: Diagnosis not present

## 2019-09-15 DIAGNOSIS — S92405A Nondisplaced unspecified fracture of left great toe, initial encounter for closed fracture: Secondary | ICD-10-CM

## 2019-09-15 NOTE — Patient Instructions (Signed)
Continue to ice 20 minutes 2-3 times daily as needed for swelling  Elevate leg frequently  Follow up for any increased swelling or other concerns.
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
## 2019-10-18 IMAGING — CT CT HEAD WITHOUT CONTRAST
3 series · 15 of 47 positions shown, 18 images · non-contrast
Comparison: None.

CLINICAL DATA: Seizure.

EXAM:
CT HEAD WITHOUT CONTRAST
TECHNIQUE: Contiguous axial images were obtained from the base of the skull
through the vertex without intravenous contrast.

[Series 2: head wo · axial · 0.48mm/px · z∈[-116,+19]mm · 9 of 33 slices shown, 12 images]
[im 3/33  brain]
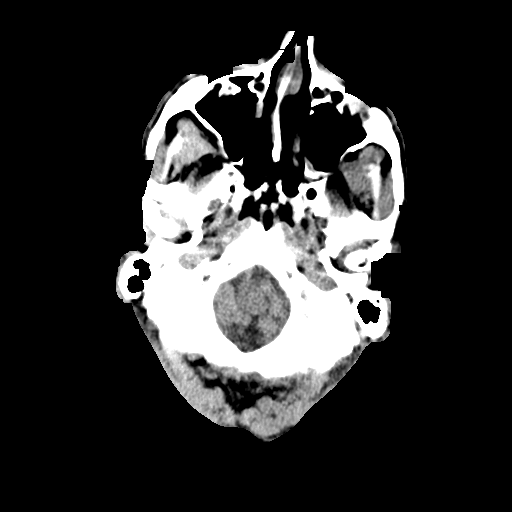
[im 3/33  bone]
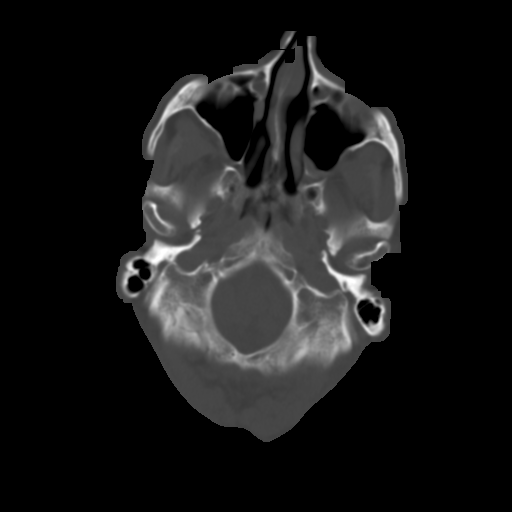
[im 6/33  brain]
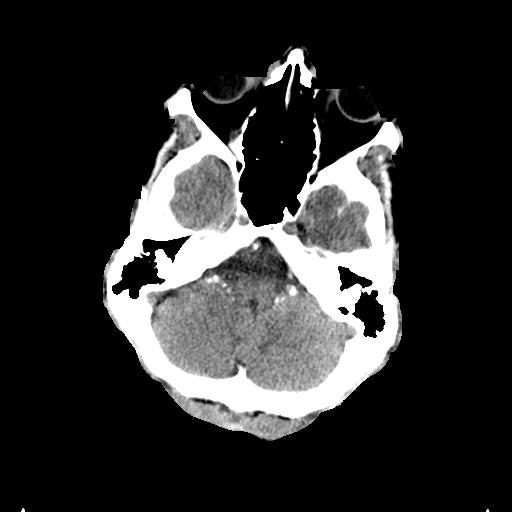
[im 9/33  brain]
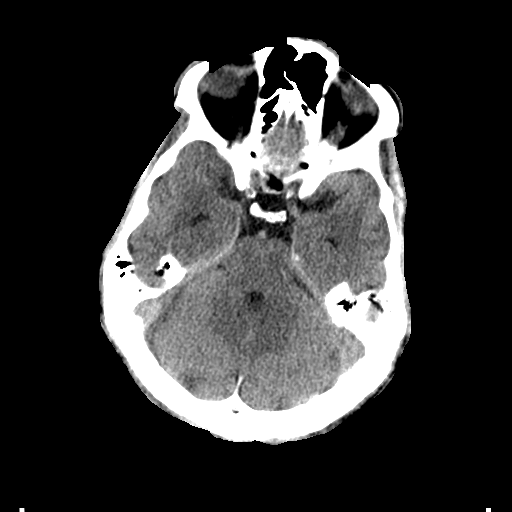
[im 13/33  brain]
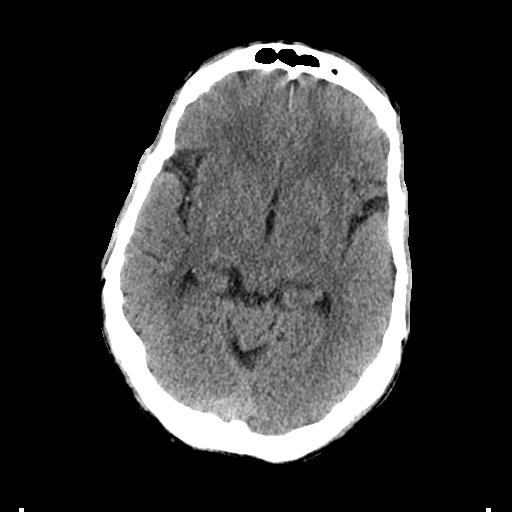
[im 17/33  brain]
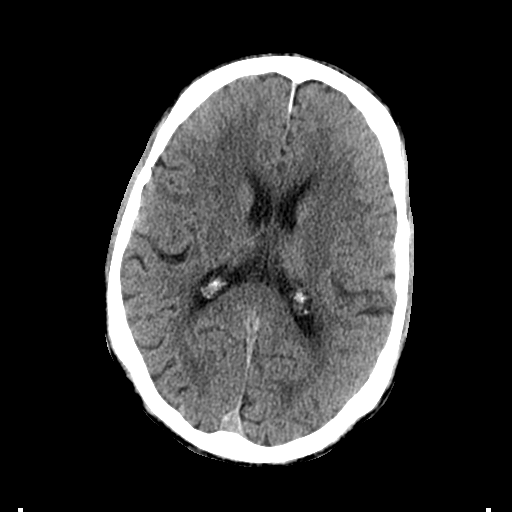
[im 17/33  bone]
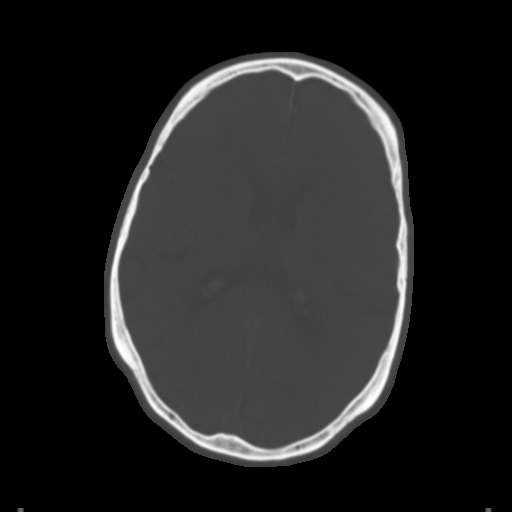
[im 20/33  brain]
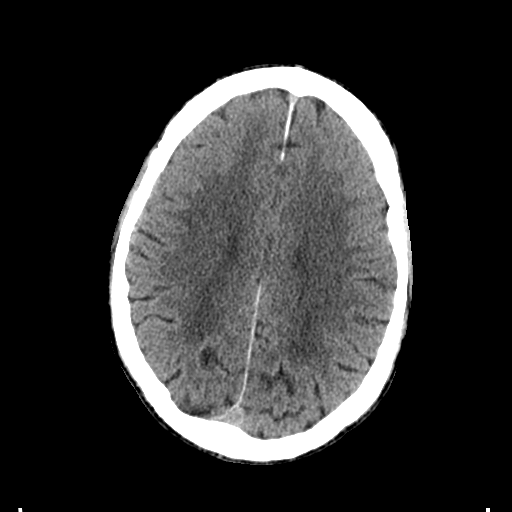
[im 24/33  brain]
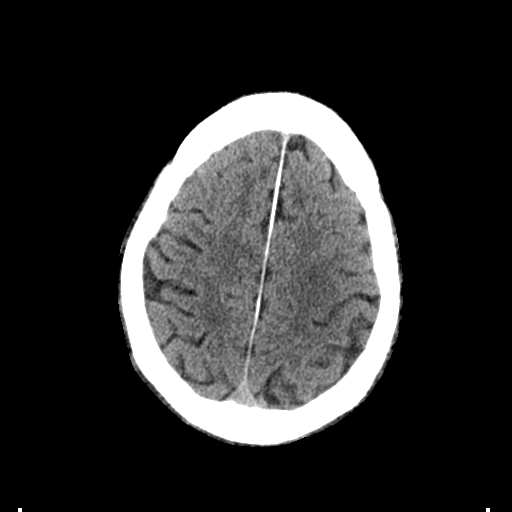
[im 27/33  brain]
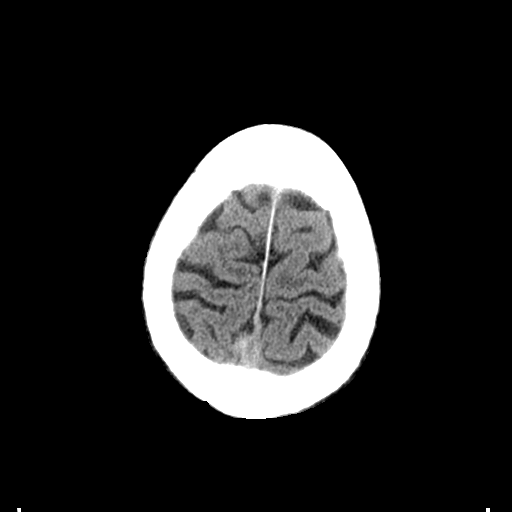
[im 30/33  brain]
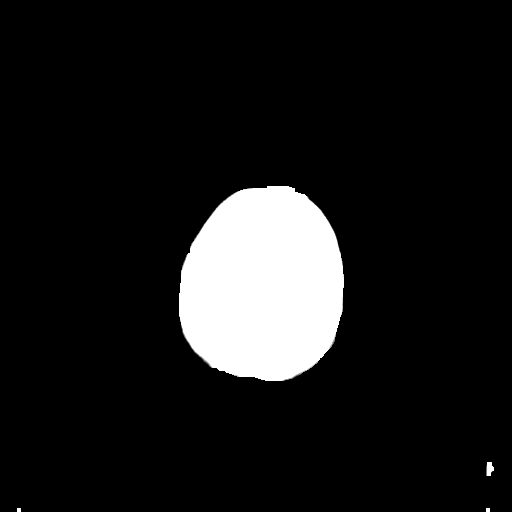
[im 30/33  bone]
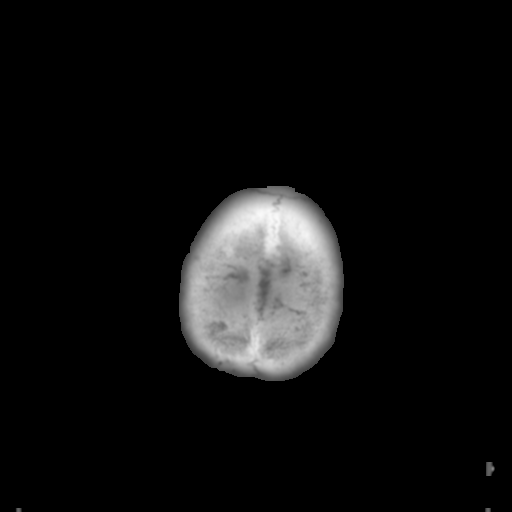

[Series 4: coronal soft tissue · coronal · 0.31mm/px · 3 of 69 slices shown]
[im 23/69  brain]
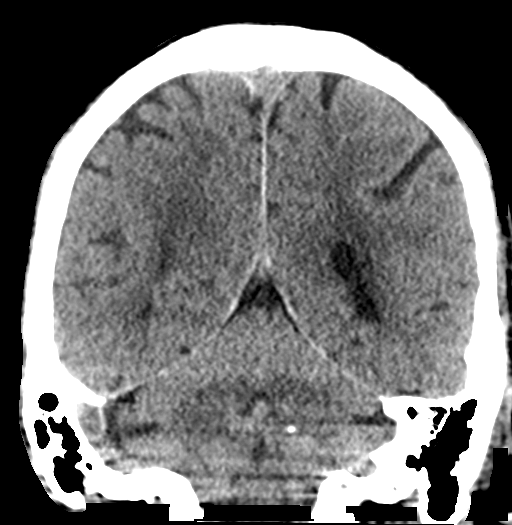
[im 31/69  brain]
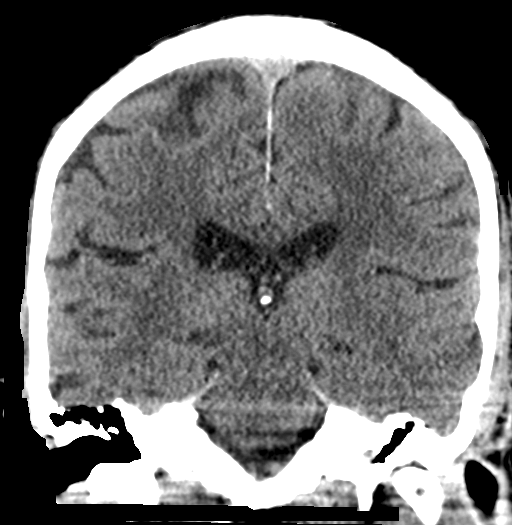
[im 38/69  brain]
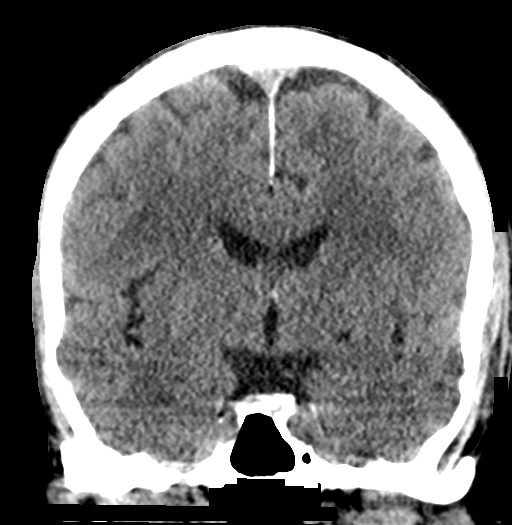

[Series 5: sagittal soft tissue · sagittal · 0.31mm/px · 3 of 52 slices shown]
[im 18/52  brain]
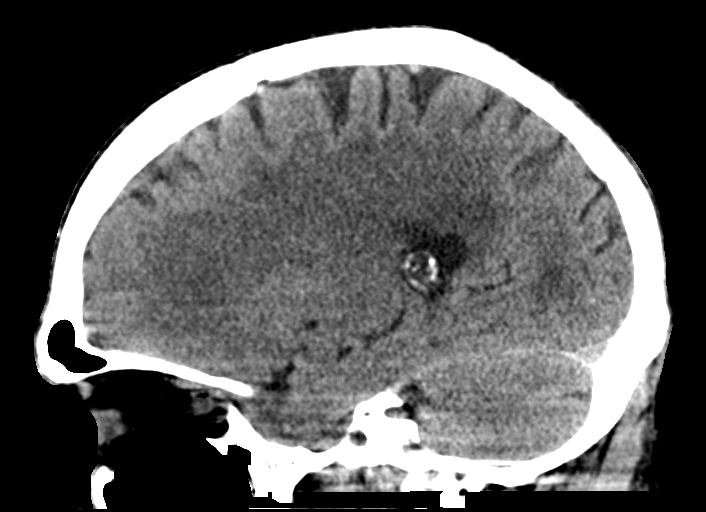
[im 26/52  brain]
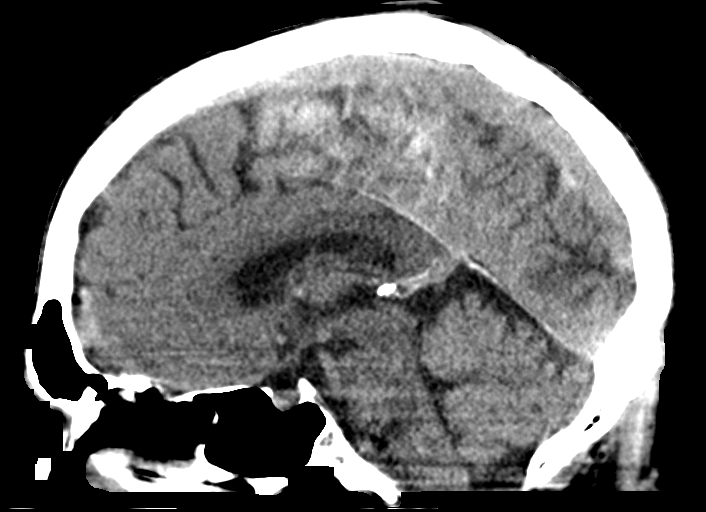
[im 35/52  brain]
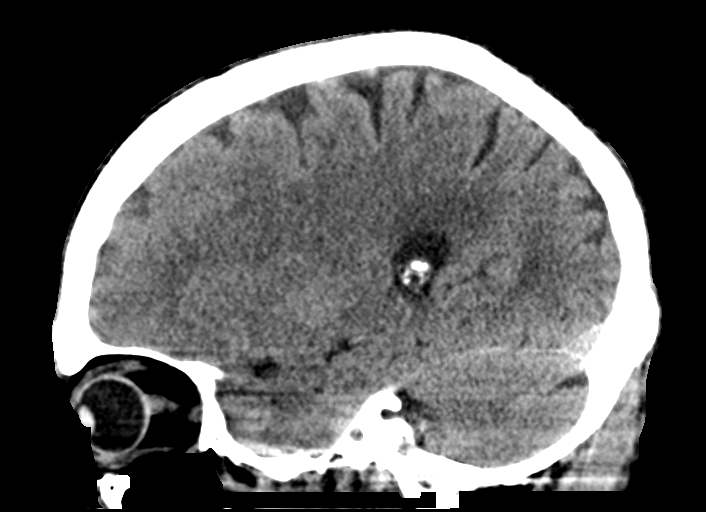

[15 of 47 positions shown; findings below may reference images not displayed]

FINDINGS: Brain: There is no evidence of acute infarct, intracranial
hemorrhage, mass, midline shift, or extra-axial fluid collection.
The ventricles and sulci are normal. Patchy cerebral white matter
hypodensities are nonspecific but compatible with mild-to-moderate
chronic small vessel ischemic disease.

Vascular: Calcified atherosclerosis at the skull base. No hyperdense
vessel.

Skull: No fracture focal osseous lesion.

Sinuses/Orbits: Mild left maxillary sinus mucosal thickening. Clear
mastoid air cells. Unremarkable orbits.

Other: None.
IMPRESSION: 1. No evidence of acute intracranial abnormality.
2. Mild-to-moderate chronic small vessel ischemic disease.

## 2019-10-20 ENCOUNTER — Encounter: Payer: Self-pay | Admitting: Family Medicine

## 2019-10-20 ENCOUNTER — Ambulatory Visit (INDEPENDENT_AMBULATORY_CARE_PROVIDER_SITE_OTHER): Payer: Medicare PPO | Admitting: Family Medicine

## 2019-10-20 ENCOUNTER — Other Ambulatory Visit: Payer: Self-pay

## 2019-10-20 VITALS — BP 120/72 | HR 65 | Temp 97.5°F | Ht 70.0 in | Wt 145.4 lb

## 2019-10-20 DIAGNOSIS — M810 Age-related osteoporosis without current pathological fracture: Secondary | ICD-10-CM | POA: Diagnosis not present

## 2019-10-20 DIAGNOSIS — Z0000 Encounter for general adult medical examination without abnormal findings: Secondary | ICD-10-CM

## 2019-10-20 LAB — CBC WITH DIFFERENTIAL/PLATELET
Basophils Absolute: 0 10*3/uL (ref 0.0–0.1)
Basophils Relative: 1.4 % (ref 0.0–3.0)
Eosinophils Absolute: 0.1 10*3/uL (ref 0.0–0.7)
Eosinophils Relative: 3.4 % (ref 0.0–5.0)
HCT: 40.4 % (ref 39.0–52.0)
Hemoglobin: 14 g/dL (ref 13.0–17.0)
Lymphocytes Relative: 37.2 % (ref 12.0–46.0)
Lymphs Abs: 1.1 10*3/uL (ref 0.7–4.0)
MCHC: 34.7 g/dL (ref 30.0–36.0)
MCV: 95.5 fl (ref 78.0–100.0)
Monocytes Absolute: 0.5 10*3/uL (ref 0.1–1.0)
Monocytes Relative: 17.3 % — ABNORMAL HIGH (ref 3.0–12.0)
Neutro Abs: 1.3 10*3/uL — ABNORMAL LOW (ref 1.4–7.7)
Neutrophils Relative %: 40.7 % — ABNORMAL LOW (ref 43.0–77.0)
Platelets: 165 10*3/uL (ref 150.0–400.0)
RBC: 4.23 Mil/uL (ref 4.22–5.81)
RDW: 13.3 % (ref 11.5–15.5)
WBC: 3.1 10*3/uL — ABNORMAL LOW (ref 4.0–10.5)

## 2019-10-20 LAB — HEPATIC FUNCTION PANEL
ALT: 26 U/L (ref 0–53)
AST: 22 U/L (ref 0–37)
Albumin: 4.5 g/dL (ref 3.5–5.2)
Alkaline Phosphatase: 110 U/L (ref 39–117)
Bilirubin, Direct: 0.2 mg/dL (ref 0.0–0.3)
Total Bilirubin: 0.7 mg/dL (ref 0.2–1.2)
Total Protein: 6.3 g/dL (ref 6.0–8.3)

## 2019-10-20 LAB — BASIC METABOLIC PANEL
BUN: 10 mg/dL (ref 6–23)
CO2: 34 mEq/L — ABNORMAL HIGH (ref 19–32)
Calcium: 9.5 mg/dL (ref 8.4–10.5)
Chloride: 100 mEq/L (ref 96–112)
Creatinine, Ser: 0.7 mg/dL (ref 0.40–1.50)
GFR: 110.64 mL/min (ref >60.00)
Glucose, Bld: 94 mg/dL (ref 70–99)
Potassium: 4.3 mEq/L (ref 3.5–5.1)
Sodium: 140 mEq/L (ref 135–145)

## 2019-10-20 LAB — TSH: TSH: 1.64 u[IU]/mL (ref 0.35–4.50)

## 2019-10-20 LAB — LIPID PANEL
Cholesterol: 168 mg/dL (ref 0–200)
HDL: 94.1 mg/dL (ref >39.00)
LDL Cholesterol: 66 mg/dL (ref 0–99)
NonHDL: 73.62
Total CHOL/HDL Ratio: 2
Triglycerides: 36 mg/dL (ref 0.0–149.0)
VLDL: 7.2 mg/dL (ref 0.0–40.0)

## 2019-10-20 LAB — PSA: PSA: 0.86 ng/mL (ref 0.10–4.00)

## 2019-10-20 NOTE — Patient Instructions (Signed)
Insomnia Insomnia is a sleep disorder that makes it difficult to fall asleep or stay asleep. Insomnia can cause fatigue, low energy, difficulty concentrating, mood swings, and poor performance at work or school. There are three different ways to classify insomnia:  Difficulty falling asleep.  Difficulty staying asleep.  Waking up too early in the morning. Any type of insomnia can be long-term (chronic) or short-term (acute). Both are common. Short-term insomnia usually lasts for three months or less. Chronic insomnia occurs at least three times a week for longer than three months. What are the causes? Insomnia may be caused by another condition, situation, or substance, such as:  Anxiety.  Certain medicines.  Gastroesophageal reflux disease (GERD) or other gastrointestinal conditions.  Asthma or other breathing conditions.  Restless legs syndrome, sleep apnea, or other sleep disorders.  Chronic pain.  Menopause.  Stroke.  Abuse of alcohol, tobacco, or illegal drugs.  Mental health conditions, such as depression.  Caffeine.  Neurological disorders, such as Alzheimer's disease.  An overactive thyroid (hyperthyroidism). Sometimes, the cause of insomnia may not be known. What increases the risk? Risk factors for insomnia include:  Gender. Women are affected more often than men.  Age. Insomnia is more common as you get older.  Stress.  Lack of exercise.  Irregular work schedule or working night shifts.  Traveling between different time zones.  Certain medical and mental health conditions. What are the signs or symptoms? If you have insomnia, the main symptom is having trouble falling asleep or having trouble staying asleep. This may lead to other symptoms, such as:  Feeling fatigued or having low energy.  Feeling nervous about going to sleep.  Not feeling rested in the morning.  Having trouble concentrating.  Feeling irritable, anxious, or depressed. How  is this diagnosed? This condition may be diagnosed based on:  Your symptoms and medical history. Your health care provider may ask about: ? Your sleep habits. ? Any medical conditions you have. ? Your mental health.  A physical exam. How is this treated? Treatment for insomnia depends on the cause. Treatment may focus on treating an underlying condition that is causing insomnia. Treatment may also include:  Medicines to help you sleep.  Counseling or therapy.  Lifestyle adjustments to help you sleep better. Follow these instructions at home: Eating and drinking   Limit or avoid alcohol, caffeinated beverages, and cigarettes, especially close to bedtime. These can disrupt your sleep.  Do not eat a large meal or eat spicy foods right before bedtime. This can lead to digestive discomfort that can make it hard for you to sleep. Sleep habits   Keep a sleep diary to help you and your health care provider figure out what could be causing your insomnia. Write down: ? When you sleep. ? When you wake up during the night. ? How well you sleep. ? How rested you feel the next day. ? Any side effects of medicines you are taking. ? What you eat and drink.  Make your bedroom a dark, comfortable place where it is easy to fall asleep. ? Put up shades or blackout curtains to block light from outside. ? Use a white noise machine to block noise. ? Keep the temperature cool.  Limit screen use before bedtime. This includes: ? Watching TV. ? Using your smartphone, tablet, or computer.  Stick to a routine that includes going to bed and waking up at the same times every day and night. This can help you fall asleep faster. Consider  making a quiet activity, such as reading, part of your nighttime routine.  Try to avoid taking naps during the day so that you sleep better at night.  Get out of bed if you are still awake after 15 minutes of trying to sleep. Keep the lights down, but try reading or  doing a quiet activity. When you feel sleepy, go back to bed. General instructions  Take over-the-counter and prescription medicines only as told by your health care provider.  Exercise regularly, as told by your health care provider. Avoid exercise starting several hours before bedtime.  Use relaxation techniques to manage stress. Ask your health care provider to suggest some techniques that may work well for you. These may include: ? Breathing exercises. ? Routines to release muscle tension. ? Visualizing peaceful scenes.  Make sure that you drive carefully. Avoid driving if you feel very sleepy.  Keep all follow-up visits as told by your health care provider. This is important. Contact a health care provider if:  You are tired throughout the day.  You have trouble in your daily routine due to sleepiness.  You continue to have sleep problems, or your sleep problems get worse. Get help right away if:  You have serious thoughts about hurting yourself or someone else. If you ever feel like you may hurt yourself or others, or have thoughts about taking your own life, get help right away. You can go to your nearest emergency department or call:  Your local emergency services (911 in the [place]  A suicide crisis helpline, such as the [facility] at 8171622475. This is open 24 hours a day. Summary  Insomnia is a sleep disorder that makes it difficult to fall asleep or stay asleep.  Insomnia can be long-term (chronic) or short-term (acute).  Treatment for insomnia depends on the cause. Treatment may focus on treating an underlying condition that is causing insomnia.  Keep a sleep diary to help you and your health care provider figure out what could be causing your insomnia. This information is not intended to replace advice given to you by your health care provider. Make sure you discuss any questions you have with your health care provider. Document  Revised: 06/14/2017 Document Reviewed: 04/11/2017 Elsevier Patient Education  2020 [place] 65 Years and Older, Male Preventive care refers to lifestyle choices and visits with your health care provider that can promote health and wellness. This includes:  A yearly physical exam. This is also called an annual well check.  Regular dental and eye exams.  Immunizations.  Screening for certain conditions.  Healthy lifestyle choices, such as diet and exercise. What can I expect for my preventive care visit? Physical exam Your health care provider will check:  Height and weight. These may be used to calculate body mass index (BMI), which is a measurement that tells if you are at a healthy weight.  Heart rate and blood pressure.  Your skin for abnormal spots. Counseling Your health care provider may ask you questions about:  Alcohol, tobacco, and drug use.  Emotional well-being.  Home and relationship well-being.  Sexual activity.  Eating habits.  History of falls.  Memory and ability to understand (cognition).  Work and work Statistician. What immunizations do I need?  Influenza (flu) vaccine  This is recommended every year. Tetanus, diphtheria, and pertussis (Tdap) vaccine  You may need a Td booster every 10 years. Varicella (chickenpox) vaccine  You may need this vaccine if you have  not already been vaccinated. Zoster (shingles) vaccine  You may need this after age 39. Pneumococcal conjugate (PCV13) vaccine  One dose is recommended after age 66. Pneumococcal polysaccharide (PPSV23) vaccine  One dose is recommended after age 71. Measles, mumps, and rubella (MMR) vaccine  You may need at least one dose of MMR if you were born in 1957 or later. You may also need a second dose. Meningococcal conjugate (MenACWY) vaccine  You may need this if you have certain conditions. Hepatitis A vaccine  You may need this if you have certain  conditions or if you travel or work in places where you may be exposed to hepatitis A. Hepatitis B vaccine  You may need this if you have certain conditions or if you travel or work in places where you may be exposed to hepatitis B. Haemophilus influenzae type b (Hib) vaccine  You may need this if you have certain conditions. You may receive vaccines as individual doses or as more than one vaccine together in one shot (combination vaccines). Talk with your health care provider about the risks and benefits of combination vaccines. What tests do I need? Blood tests  Lipid and cholesterol levels. These may be checked every 5 years, or more frequently depending on your overall health.  Hepatitis C test.  Hepatitis B test. Screening  Lung cancer screening. You may have this screening every year starting at age 69 if you have a 30-pack-year history of smoking and currently smoke or have quit within the past 15 years.  Colorectal cancer screening. All adults should have this screening starting at age 32 and continuing until age 80. Your health care provider may recommend screening at age 25 if you are at increased risk. You will have tests every 1-10 years, depending on your results and the type of screening test.  Prostate cancer screening. Recommendations will vary depending on your family history and other risks.  Diabetes screening. This is done by checking your blood sugar (glucose) after you have not eaten for a while (fasting). You may have this done every 1-3 years.  Abdominal aortic aneurysm (AAA) screening. You may need this if you are a current or former smoker.  Sexually transmitted disease (STD) testing. Follow these instructions at home: Eating and drinking  Eat a diet that includes fresh fruits and vegetables, whole grains, lean protein, and low-fat dairy products. Limit your intake of foods with high amounts of sugar, saturated fats, and salt.  Take vitamin and mineral  supplements as recommended by your health care provider.  Do not drink alcohol if your health care provider tells you not to drink.  If you drink alcohol: ? Limit how much you have to 0-2 drinks a day. ? Be aware of how much alcohol is in your drink. In the [place] one drink equals one 12 oz bottle of beer (355 mL), one 5 oz glass of wine (148 mL), or one 1 oz glass of hard liquor (44 mL). Lifestyle  Take daily care of your teeth and gums.  Stay active. Exercise for at least 30 minutes on 5 or more days each week.  Do not use any products that contain nicotine or tobacco, such as cigarettes, e-cigarettes, and chewing tobacco. If you need help quitting, ask your health care provider.  If you are sexually active, practice safe sex. Use a condom or other form of protection to prevent STIs (sexually transmitted infections).  Talk with your health care provider about taking a low-dose aspirin or  statin. What's next?  Visit your health care provider once a year for a well check visit.  Ask your health care provider how often you should have your eyes and teeth checked.  Stay up to date on all vaccines. This information is not intended to replace advice given to you by your health care provider. Make sure you discuss any questions you have with your health care provider. Document Revised: 06/26/2018 Document Reviewed: 06/26/2018 Elsevier Patient Education  2020 [employer].

## 2019-10-20 NOTE — Progress Notes (Signed)
Subjective:     Patient ID: Nathan [name], male   DOB: 18-Apr-1946, 74 y.o.   MRN: [id number]  HPI Mr. Vanwyhe is seen for physical exam.  He has history of seizure disorder which is controlled with phenobarbital which he has been on for several years.  Last summer he was doing prep for colonoscopy and had severe hyponatremia with sodium 120 which induced a seizure.  He has had no problems with low sodium since then.  He tried to do another prep with GoLYTELY with another GI group in October but had symptoms of illness and never completed the prep.  He is looking at an alternative type of prep possibly with a physician in [place] later this year.  He had Covid vaccine x2 with vaccine February 4 and March 4.  He got [place].  Major complaint is difficulty sleeping which is been chronic for years.  He thinks he has restless leg syndrome.  Symptoms usually relieved with walking.  Avoids caffeine.  No alcohol.  No history of anemia.  History of low bone density.  He had DEXA scan 2 years ago.  This was improved after starting over-the-counter allergy Cal.  He exercises regularly.  He had one recent fall with broken toe.  No consistent falls.  Fairly low risk.  Past Medical History:  Diagnosis Date  . Colon polyps    Sessile Serrated adenomas  . DYSPNEA 02/09/2009  . RESTLESS LEGS SYNDROME 02/28/2009  . SEIZURE DISORDER 02/09/2009   last seisure Sep 10, 2001   Past Surgical History:  Procedure Laterality Date  . COLONOSCOPY    . HERNIA REPAIR Right 02/11/09   dr. Donella Stade [facility]  . HERNIA REPAIR Left   . LAPAROSCOPIC APPENDECTOMY  06/01/2011   Procedure: APPENDECTOMY LAPAROSCOPIC;  Surgeon: Shann Medal, MD;  Location: [facility];  Service: General;  Laterality: N/A;  . POLYPECTOMY      reports that he has never smoked. He has never used smokeless tobacco. He reports that he does not drink alcohol or use drugs. family history includes Cancer in his father and mother; Diabetes Mellitus II in  his father. Allergies  Allergen Reactions  . Suprep [Na Sulfate-K Sulfate-Mg Sulf] Nausea And Vomiting    Hyponatremia leading to seizure  . Carbamazepine     REACTION: fatigue, sleepy  . Ciprofloxacin     Per pt, "thinks caused rash"  . Clindamycin/Lincomycin Diarrhea  . Metronidazole     REACTION: GI upset  . Penicillins     REACTION: childhood, hives?  . Phenytoin     REACTION: rash, leg swelling  . Sulfamethoxazole Other (See Comments)  . Trimethoprim Other (See Comments)  . Bactrim Other (See Comments)    Caused headaches, that progressively worsened when on medication. Once off, they went away.  . Phenytoin Sodium Extended Swelling and Rash    Swelling of ankles. Happened years ago per patient.     Review of Systems  Constitutional: Negative for activity change, appetite change, fatigue, fever and unexpected weight change.  HENT: Negative for congestion, ear pain and trouble swallowing.   Eyes: Negative for pain and visual disturbance.  Respiratory: Negative for cough, shortness of breath and wheezing.   Cardiovascular: Negative for chest pain and palpitations.  Gastrointestinal: Negative for abdominal distention, abdominal pain, blood in stool, constipation, diarrhea, nausea, rectal pain and vomiting.  Endocrine: Negative for polydipsia and polyuria.  Genitourinary: Negative for dysuria, hematuria and testicular pain.  Musculoskeletal: Negative for arthralgias and joint swelling.  Skin: Negative for rash.  Neurological: Negative for dizziness, syncope and headaches.  Hematological: Negative for adenopathy.  Psychiatric/Behavioral: Negative for confusion and dysphoric mood.       Objective:   Physical Exam Constitutional:      Appearance: He is well-developed.  HENT:     Right Ear: External ear normal.     Left Ear: External ear normal.     Mouth/Throat:     Mouth: Mucous membranes are moist.     Pharynx: Oropharynx is clear.  Eyes:     Pupils: Pupils are  equal, round, and reactive to light.  Neck:     Thyroid: No thyromegaly.  Cardiovascular:     Rate and Rhythm: Normal rate and regular rhythm.  Pulmonary:     Effort: Pulmonary effort is normal. No respiratory distress.     Breath sounds: Normal breath sounds. No wheezing or rales.  Abdominal:     Palpations: Abdomen is soft. There is no mass.     Tenderness: There is no abdominal tenderness. There is no guarding or rebound.     Hernia: No hernia is present.  Musculoskeletal:     Cervical back: Neck supple.     Right lower leg: No edema.     Left lower leg: No edema.  Skin:    Findings: No rash.  Neurological:     Mental Status: He is alert and oriented to person, place, and time.     Cranial Nerves: No cranial nerve deficit.        Assessment:     Physical exam.  History of seizure disorder stable on phenobarbital.  History of recent hyponatremia event following prep for colonoscopy.  We discussed the following health maintenance issues    Plan:     -Recommend repeat DEXA scan in a couple months  -Obtain follow-up screening labs  -The natural history of prostate cancer and ongoing controversy regarding screening and potential treatment outcomes of prostate cancer has been discussed with the patient. The meaning of a false positive PSA and a false negative PSA has been discussed. He indicates understanding of the limitations of this screening test and wishes to proceed with screening PSA testing.  -Discussed Shingrix vaccine he declines at this time  -Continue regular weightbearing exercise  -He is looking into options for repeat colonoscopy later this summer  [name] MD [facility]

## 2019-11-17 ENCOUNTER — Other Ambulatory Visit: Payer: Self-pay

## 2019-11-17 ENCOUNTER — Encounter: Payer: Self-pay | Admitting: Podiatry

## 2019-11-17 ENCOUNTER — Ambulatory Visit: Payer: Medicare PPO | Admitting: Podiatry

## 2019-11-17 VITALS — BP 156/90 | HR 62 | Resp 16

## 2019-11-17 DIAGNOSIS — M79676 Pain in unspecified toe(s): Secondary | ICD-10-CM

## 2019-11-17 DIAGNOSIS — B351 Tinea unguium: Secondary | ICD-10-CM | POA: Diagnosis not present

## 2019-11-17 DIAGNOSIS — Q828 Other specified congenital malformations of skin: Secondary | ICD-10-CM

## 2019-11-17 DIAGNOSIS — M48061 Spinal stenosis, lumbar region without neurogenic claudication: Secondary | ICD-10-CM | POA: Insufficient documentation

## 2019-11-17 DIAGNOSIS — G609 Hereditary and idiopathic neuropathy, unspecified: Secondary | ICD-10-CM | POA: Insufficient documentation

## 2019-11-17 DIAGNOSIS — M5136 Other intervertebral disc degeneration, lumbar region: Secondary | ICD-10-CM | POA: Insufficient documentation

## 2019-11-17 NOTE — Progress Notes (Signed)
Subjective:  Patient ID: Nathan [name], male    DOB: 23-Jul-1945,  MRN: [id number] HPI Chief Complaint  Patient presents with  . Debridement    Requesting nail and callus trim   . New Patient (Initial Visit)    74 y.o. male presents with the above complaint.   ROS: Denies fever chills nausea vomiting muscle aches pains calf pain back pain chest pain shortness of breath.  Past Medical History:  Diagnosis Date  . Colon polyps    Sessile Serrated adenomas  . DYSPNEA 02/09/2009  . RESTLESS LEGS SYNDROME 02/28/2009  . SEIZURE DISORDER 02/09/2009   last seisure Sep 10, 2001   Past Surgical History:  Procedure Laterality Date  . COLONOSCOPY    . HERNIA REPAIR Right 02/11/09   dr. Donella Stade -[facility]  . HERNIA REPAIR Left   . LAPAROSCOPIC APPENDECTOMY  06/01/2011   Procedure: APPENDECTOMY LAPAROSCOPIC;  Surgeon: Shann Medal, MD;  Location: [facility];  Service: General;  Laterality: N/A;  . POLYPECTOMY      Current Outpatient Medications:  .  BISACODYL 5 MG EC tablet, See admin instructions., Disp: , Rfl:  .  cholecalciferol (VITAMIN D) 1000 UNITS tablet, Take 1,000 Units by mouth daily. , Disp: , Rfl:  .  Cod Liver Oil OIL, Take 5 mLs by mouth daily.  , Disp: , Rfl:  .  Coenzyme Q10 (CO Q 10) 60 MG CAPS, Take 1 tablet by mouth daily.  , Disp: , Rfl:  .  cyanocobalamin 500 MCG tablet, Take 1,000 mcg by mouth daily., Disp: , Rfl:  .  GAVILYTE-G 236 g solution, , Disp: , Rfl:  .  Homeopathic Products (SIMILASAN DRY EYE RELIEF) SOLN, Apply 1 drop to eye daily as needed. Dry Eyes , Disp: , Rfl:  .  ondansetron (ZOFRAN-ODT) 4 MG disintegrating tablet, , Disp: , Rfl:  .  OVER THE COUNTER MEDICATION, AlgeaCal, Disp: , Rfl:  .  OVER THE COUNTER MEDICATION, Strontium citrate, Disp: , Rfl:  .  PHENObarbital (LUMINAL) 60 MG tablet, Take 1 tablet (60 mg total) by mouth at bedtime., Disp: 90 tablet, Rfl: 1 .  Probiotic Product (PROBIOTIC DAILY PO), Take by mouth daily., Disp: , Rfl:  .  vitamin C  (ASCORBIC ACID) 500 MG tablet, Take 500 mg by mouth 3 (three) times daily.  , Disp: , Rfl:   Allergies  Allergen Reactions  . Suprep [Na Sulfate-K Sulfate-Mg Sulf] Nausea And Vomiting    Hyponatremia leading to seizure  . Carbamazepine     REACTION: fatigue, sleepy  . Ciprofloxacin     Per pt, "thinks caused rash"  . Clindamycin/Lincomycin Diarrhea  . Metronidazole     REACTION: GI upset  . Penicillins     REACTION: childhood, hives?  . Phenytoin     REACTION: rash, leg swelling  . Sulfamethoxazole Other (See Comments)  . Trimethoprim Other (See Comments)  . Bactrim Other (See Comments)    Caused headaches, that progressively worsened when on medication. Once off, they went away.  . Phenytoin Sodium Extended Swelling and Rash    Swelling of ankles. Happened years ago per patient.   Review of Systems Objective:   Vitals:   11/17/19 0901  BP: (!) 156/90  Pulse: 62  Resp: 16    General: Well developed, nourished, in no acute distress, alert and oriented x3   Dermatological: Skin is warm, dry and supple bilateral. Nails x 10 are well maintained but they are thick and dystrophic possibly mycotic.; remaining integument appears  unremarkable at this time. There are no open sores, no preulcerative lesions, no rash or signs of infection present.  Also demonstrates reactive hyperkeratotic lesions subsecond and third metatarsal heads bilaterally with significant fat pad atrophy.  Vascular: Dorsalis Pedis artery and Posterior Tibial artery pedal pulses are 2/4 bilateral with immedate capillary fill time. Pedal hair growth present. No varicosities and no lower extremity edema present bilateral.   Neruologic: Grossly intact via light touch bilateral. Vibratory intact via tuning fork bilateral. Protective threshold with Semmes Wienstein monofilament intact to all pedal sites bilateral. Patellar and Achilles deep tendon reflexes 2+ bilateral. No Babinski or clonus noted bilateral.    Musculoskeletal: No gross boney pedal deformities bilateral. No pain, crepitus, or limitation noted with foot and ankle range of motion bilateral. Muscular strength 5/5 in all groups tested bilateral.  Gait: Unassisted, Nonantalgic.    Radiographs:  None taken  Assessment & Plan:   Assessment: Painful calluses subsecond and third metatarsal phalangeal joints bilateral.  Pain in limb secondary to onychomycosis.  Plan: At this point I debrided all reactive hyperkeratotic tissue.  I debrided toenails 1 through 5 bilateral.     Donnette Macmullen T. [place], [place]

## 2019-12-11 ENCOUNTER — Other Ambulatory Visit: Payer: Self-pay | Admitting: Family Medicine

## 2020-01-25 DIAGNOSIS — R1030 Lower abdominal pain, unspecified: Secondary | ICD-10-CM | POA: Diagnosis not present

## 2020-01-25 DIAGNOSIS — Z86010 Personal history of colonic polyps: Secondary | ICD-10-CM | POA: Diagnosis not present

## 2020-01-25 DIAGNOSIS — R194 Change in bowel habit: Secondary | ICD-10-CM | POA: Diagnosis not present

## 2020-02-02 ENCOUNTER — Encounter: Payer: Self-pay | Admitting: Podiatry

## 2020-02-02 ENCOUNTER — Ambulatory Visit: Payer: Medicare PPO | Admitting: Podiatry

## 2020-02-02 ENCOUNTER — Other Ambulatory Visit: Payer: Self-pay

## 2020-02-02 DIAGNOSIS — B351 Tinea unguium: Secondary | ICD-10-CM

## 2020-02-02 DIAGNOSIS — M79676 Pain in unspecified toe(s): Secondary | ICD-10-CM | POA: Diagnosis not present

## 2020-02-02 DIAGNOSIS — Q828 Other specified congenital malformations of skin: Secondary | ICD-10-CM

## 2020-02-02 NOTE — Progress Notes (Signed)
He presents today chief complaint of painfully elongated toenails bilaterally.  Also complaining of painful calluses bilaterally.  Objective: Signs are stable he is alert oriented x3 pulses are palpable.  Neurologic sensorium is intact deep to reflexes are intact muscle strength is normal symmetrical.  Mild hammertoe deformities are noted.  Toenails are long thick yellow dystrophic-like mycotic he also has reactive hyperkeratosis plantar aspect bilateral foot.  Assessment: Pain in secondary to onychomycosis and porokeratosis bilaterally.  Plan: Discussed etiology pathology and surgical therapies at this point went ahead and trimmed his nails 1 through 5 bilaterally debrided all reactive hyperkeratosis.  Follow-up with Kennyth Lose in a few weeks.

## 2020-02-03 ENCOUNTER — Ambulatory Visit (INDEPENDENT_AMBULATORY_CARE_PROVIDER_SITE_OTHER): Payer: Medicare PPO

## 2020-02-03 DIAGNOSIS — Z0000 Encounter for general adult medical examination without abnormal findings: Secondary | ICD-10-CM

## 2020-02-03 NOTE — Patient Instructions (Signed)
Mr. Nathan [name] , Thank you for taking time to come for your Medicare Wellness Visit. I appreciate your ongoing commitment to your health goals. Please review the following plan we discussed and let me know if I can assist you in the future.   Screening recommendations/referrals: Colonoscopy: Currently due, scheduled for this Friday 02/05/2020 at [facility] Recommended yearly ophthalmology/optometry visit for glaucoma screening and checkup Recommended yearly dental visit for hygiene and checkup  Vaccinations: Influenza vaccine: Up to date, next due 02/2020 Pneumococcal vaccine: completed series Tdap vaccine: up to date, next due 01/08/2027 Shingles vaccine: Patient Declined  Advanced directives: Please bring in a copy of your advanced directives at your next office visit  Conditions/risks identified: None   Next appointment: None   Preventive Care 8 Years and Older, Male Preventive care refers to lifestyle choices and visits with your health care provider that can promote health and wellness. What does preventive care include?  A yearly physical exam. This is also called an annual well check.  Dental exams once or twice a year.  Routine eye exams. Ask your health care provider how often you should have your eyes checked.  Personal lifestyle choices, including:  Daily care of your teeth and gums.  Regular physical activity.  Eating a healthy diet.  Avoiding tobacco and drug use.  Limiting alcohol use.  Practicing safe sex.  Taking low doses of aspirin every day.  Taking vitamin and mineral supplements as recommended by your health care provider. What happens during an annual well check? The services and screenings done by your health care provider during your annual well check will depend on your age, overall health, lifestyle risk factors, and family history of disease. Counseling  Your health care provider may ask you questions about your:  Alcohol use.  Tobacco  use.  Drug use.  Emotional well-being.  Home and relationship well-being.  Sexual activity.  Eating habits.  History of falls.  Memory and ability to understand (cognition).  Work and work Statistician. Screening  You may have the following tests or measurements:  Height, weight, and BMI.  Blood pressure.  Lipid and cholesterol levels. These may be checked every 5 years, or more frequently if you are over 28 years old.  Skin check.  Lung cancer screening. You may have this screening every year starting at age 97 if you have a 30-pack-year history of smoking and currently smoke or have quit within the past 15 years.  Fecal occult blood test (FOBT) of the stool. You may have this test every year starting at age 76.  Flexible sigmoidoscopy or colonoscopy. You may have a sigmoidoscopy every 5 years or a colonoscopy every 10 years starting at age 55.  Prostate cancer screening. Recommendations will vary depending on your family history and other risks.  Hepatitis C blood test.  Hepatitis B blood test.  Sexually transmitted disease (STD) testing.  Diabetes screening. This is done by checking your blood sugar (glucose) after you have not eaten for a while (fasting). You may have this done every 1-3 years.  Abdominal aortic aneurysm (AAA) screening. You may need this if you are a current or former smoker.  Osteoporosis. You may be screened starting at age 55 if you are at high risk. Talk with your health care provider about your test results, treatment options, and if necessary, the need for more tests. Vaccines  Your health care provider may recommend certain vaccines, such as:  Influenza vaccine. This is recommended every year.  Tetanus,  diphtheria, and acellular pertussis (Tdap, Td) vaccine. You may need a Td booster every 10 years.  Zoster vaccine. You may need this after age 37.  Pneumococcal 13-valent conjugate (PCV13) vaccine. One dose is recommended after age  61.  Pneumococcal polysaccharide (PPSV23) vaccine. One dose is recommended after age 79. Talk to your health care provider about which screenings and vaccines you need and how often you need them. This information is not intended to replace advice given to you by your health care provider. Make sure you discuss any questions you have with your health care provider. Document Released: 07/29/2015 Document Revised: 03/21/2016 Document Reviewed: 05/03/2015 Elsevier Interactive Patient Education  2017 [place] Prevention in the Home Falls can cause injuries. They can happen to people of all ages. There are many things you can do to make your home safe and to help prevent falls. What can I do on the outside of my home?  Regularly fix the edges of walkways and driveways and fix any cracks.  Remove anything that might make you trip as you walk through a door, such as a raised step or threshold.  Trim any bushes or trees on the path to your home.  Use bright outdoor lighting.  Clear any walking paths of anything that might make someone trip, such as rocks or tools.  Regularly check to see if handrails are loose or broken. Make sure that both sides of any steps have handrails.  Any raised decks and porches should have guardrails on the edges.  Have any leaves, snow, or ice cleared regularly.  Use sand or salt on walking paths during winter.  Clean up any spills in your garage right away. This includes oil or grease spills. What can I do in the bathroom?  Use night lights.  Install grab bars by the toilet and in the tub and shower. Do not use towel bars as grab bars.  Use non-skid mats or decals in the tub or shower.  If you need to sit down in the shower, use a plastic, non-slip stool.  Keep the floor dry. Clean up any water that spills on the floor as soon as it happens.  Remove soap buildup in the tub or shower regularly.  Attach bath mats securely with double-sided  non-slip rug tape.  Do not have throw rugs and other things on the floor that can make you trip. What can I do in the bedroom?  Use night lights.  Make sure that you have a light by your bed that is easy to reach.  Do not use any sheets or blankets that are too big for your bed. They should not hang down onto the floor.  Have a firm chair that has side arms. You can use this for support while you get dressed.  Do not have throw rugs and other things on the floor that can make you trip. What can I do in the kitchen?  Clean up any spills right away.  Avoid walking on wet floors.  Keep items that you use a lot in easy-to-reach places.  If you need to reach something above you, use a strong step stool that has a grab bar.  Keep electrical cords out of the way.  Do not use floor polish or wax that makes floors slippery. If you must use wax, use non-skid floor wax.  Do not have throw rugs and other things on the floor that can make you trip. What can I do with  my stairs?  Do not leave any items on the stairs.  Make sure that there are handrails on both sides of the stairs and use them. Fix handrails that are broken or loose. Make sure that handrails are as long as the stairways.  Check any carpeting to make sure that it is firmly attached to the stairs. Fix any carpet that is loose or worn.  Avoid having throw rugs at the top or bottom of the stairs. If you do have throw rugs, attach them to the floor with carpet tape.  Make sure that you have a light switch at the top of the stairs and the bottom of the stairs. If you do not have them, ask someone to add them for you. What else can I do to help prevent falls?  Wear shoes that:  Do not have high heels.  Have rubber bottoms.  Are comfortable and fit you well.  Are closed at the toe. Do not wear sandals.  If you use a stepladder:  Make sure that it is fully opened. Do not climb a closed stepladder.  Make sure that both  sides of the stepladder are locked into place.  Ask someone to hold it for you, if possible.  Clearly mark and make sure that you can see:  Any grab bars or handrails.  First and last steps.  Where the edge of each step is.  Use tools that help you move around (mobility aids) if they are needed. These include:  Canes.  Walkers.  Scooters.  Crutches.  Turn on the lights when you go into a dark area. Replace any light bulbs as soon as they burn out.  Set up your furniture so you have a clear path. Avoid moving your furniture around.  If any of your floors are uneven, fix them.  If there are any pets around you, be aware of where they are.  Review your medicines with your doctor. Some medicines can make you feel dizzy. This can increase your chance of falling. Ask your doctor what other things that you can do to help prevent falls. This information is not intended to replace advice given to you by your health care provider. Make sure you discuss any questions you have with your health care provider. Document Released: 04/28/2009 Document Revised: 12/08/2015 Document Reviewed: 08/06/2014 Elsevier Interactive Patient Education  2017 [employer].

## 2020-02-03 NOTE — Progress Notes (Signed)
Subjective:   Nathan [name] is a 74 y.o. male who presents for Medicare Annual/Subsequent preventive examination.  I connected with Nathan [name] today by telephone and verified that I am speaking with the correct person using two identifiers. Location patient: home Location provider: work Persons participating in the virtual visit: patient, provider.   I discussed the limitations, risks, security and privacy concerns of performing an evaluation and management service by telephone and the availability of in person appointments. I also discussed with the patient that there may be a patient responsible charge related to this service. The patient expressed understanding and verbally consented to this telephonic visit.    Interactive audio and video telecommunications were attempted between this provider and patient, however failed, due to patient having technical difficulties OR patient did not have access to video capability.  We continued and completed visit with audio only.      Review of Systems    N/A Cardiac Risk Factors include: advanced age (>17mn, >>92women);male gender     Objective:    Today's Vitals   There is no height or weight on file to calculate BMI.  Advanced Directives 02/03/2020 12/25/2018 11/23/2016 11/23/2014 11/24/2013 06/01/2011  Does Patient Have a Medical Advance Directive? Yes No Yes No Patient has advance directive, copy not in chart Patient has advance directive, copy not in chart  Type of Advance Directive Living will;Healthcare Power of [place]  Does patient want to make changes to medical advance directive? No - Patient declined - - - - -  Copy of HWhittemorein Chart? No - copy requested - - - - -  Would patient like information on creating a medical advance directive? - No - Patient declined - No - patient declined information - -    Current Medications (verified) Outpatient Encounter Medications as of 02/03/2020    Medication Sig  . cholecalciferol (VITAMIN D) 1000 UNITS tablet Take 1,000 Units by mouth daily.   . Cod Liver Oil OIL Take 5 mLs by mouth daily.    . Coenzyme Q10 (CO Q 10) 60 MG CAPS Take 1 tablet by mouth daily.    . cyanocobalamin 500 MCG tablet Take 1,000 mcg by mouth daily.  . folic acid (FOLVITE) 1 MG tablet Take 1 mg by mouth daily.  .Marland KitchenGAVILYTE-G 236 g solution   . Homeopathic Products (SIMILASAN DRY EYE RELIEF) SOLN Apply 1 drop to eye daily as needed. Dry Eyes   . OVER THE COUNTER MEDICATION AlgeaCal  . OVER THE COUNTER MEDICATION Strontium citrate  . PHENObarbital (LUMINAL) 60 MG tablet TAKE ONE TABLET BY MOUTH AT BEDTIME  . Probiotic Product (PROBIOTIC DAILY PO) Take by mouth daily.  . vitamin C (ASCORBIC ACID) 500 MG tablet Take 500 mg by mouth 3 (three) times daily.    .Marland Kitchenzinc gluconate 50 MG tablet Take 50 mg by mouth daily.  .Marland KitchenBISACODYL 5 MG EC tablet See admin instructions. (Patient not taking: Reported on 02/03/2020)  . ondansetron (ZOFRAN-ODT) 4 MG disintegrating tablet  (Patient not taking: Reported on 02/03/2020)   No facility-administered encounter medications on file as of 02/03/2020.    Allergies (verified) Suprep [na sulfate-k sulfate-mg sulf], Carbamazepine, Ciprofloxacin, Clindamycin/lincomycin, Metronidazole, Penicillins, Phenytoin, Sulfamethoxazole, Trimethoprim, Bactrim, and Phenytoin sodium extended   History: Past Medical History:  Diagnosis Date  . Colon polyps    Sessile Serrated adenomas  . DYSPNEA 02/09/2009  . RESTLESS LEGS SYNDROME 02/28/2009  . SEIZURE DISORDER  02/09/2009   last seisure Sep 10, 2001   Past Surgical History:  Procedure Laterality Date  . COLONOSCOPY    . HERNIA REPAIR Right 02/11/09   dr. Donella Stade -[facility]  . HERNIA REPAIR Left   . LAPAROSCOPIC APPENDECTOMY  06/01/2011   Procedure: APPENDECTOMY LAPAROSCOPIC;  Surgeon: Shann Medal, MD;  Location: [facility];  Service: General;  Laterality: N/A;  . POLYPECTOMY     Family History   Problem Relation Age of Onset  . Cancer Mother        unknown type but thinks "male organs"  . Cancer Father        multiple myeloma  . Diabetes Mellitus II Father   . Stroke Neg Hx        grandparent  . Colon cancer Neg Hx   . Esophageal cancer Neg Hx   . Rectal cancer Neg Hx   . Stomach cancer Neg Hx   . Pancreatic cancer Neg Hx   . Liver disease Neg Hx    Social History   Socioeconomic History  . Marital status: Married    Spouse name: Nathan [name]  . Number of children: 0  . Years of education: 2 MA's   . Highest education level: Not on file  Occupational History  . 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 Smoking status: Never Smoker  . Smokeless tobacco: Never Used  Vaping Use  . Vaping Use: Never used  Substance and Sexual Activity  . Alcohol use: No    Alcohol/week: 0.0 standard drinks  . Drug use: No  . Sexual activity: Not on file  Other Topics Concern  . Not on file  Social History Narrative   Patient lives at home with spouse.   Caffeine Use: quit 1980   Lives in a one story home. No chidren.   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 Difficulty of Paying Living Expenses:   Food Insecurity:   . Worried About Charity fundraiser in the Last Year:   . Arboriculturist in the Last Year:   Transportation Needs:   . Film/video editor (Medical):   Marland Kitchen Lack of Transportation (Non-Medical):   Physical Activity:   . Days of Exercise per Week:   . Minutes of Exercise per Session:   Stress:   . Feeling of Stress :   Social Connections: Moderately Integrated  . Frequency of Communication with Friends and Family: More than three times a week  . Frequency of Social Gatherings with Friends and Family: Three times a week  . Attends Religious Services: More than 4 times per year  . Active Member  of Clubs or Organizations: No  . Attends Archivist Meetings: Never  . Marital Status: Married    Tobacco Counseling Counseling given: Not Answered   Clinical Intake:  Pre-visit preparation completed: Yes  Pain : No/denies pain     Nutritional Risks: None Diabetes: No  How often do you need to have someone help you when you read instructions, pamphlets, or other written materials from your doctor or pharmacy?: 1 - Never What is the last grade level you completed in school?: Masters Degree  Diabetic?No  Interpreter Needed?: No  Information entered by :: SCREWS,LPN   Activities of Daily Living In your present state of health,  do you have any difficulty performing the following activities: 02/03/2020  Hearing? Y  Comment Wears bilateral hearing aids  Difficulty concentrating or making decisions? N  Walking or climbing stairs? N  Dressing or bathing? N  Doing errands, shopping? N  Preparing Food and eating ? N  Using the Toilet? N  In the past six months, have you accidently leaked urine? N  Do you have problems with loss of bowel control? N  Managing your Medications? N  Managing your Finances? N  Housekeeping or managing your Housekeeping? N  Some recent data might be hidden    Patient Care Team: [name], MD as PCP - General  Indicate any recent Medical Services you may have received from other than [facility] providers in the past year (date may be approximate).     Assessment:   This is a routine wellness examination for Nathan [name].  Hearing/Vision screen  Hearing Screening   '125Hz'$  '250Hz'$  '500Hz'$  '1000Hz'$  '2000Hz'$  '3000Hz'$  '4000Hz'$  '6000Hz'$  '8000Hz'$   Right ear:           Left ear:           Vision Screening Comments: Patient states gets eye exams yearly   Dietary issues and exercise activities discussed: Current Exercise Habits: Home exercise routine, Type of exercise: strength training/weights;walking, Time (Minutes): 35, Frequency (Times/Week): 3, Weekly  Exercise (Minutes/Week): 105, Intensity: Mild, Exercise limited by: None identified  Goals    . patient     To continue to eat well and exercise     . Patient Stated     I will continue to exercise for at least 3 days per  week       Depression Screen PHQ 2/9 Scores 02/03/2020 11/03/2018 11/25/2017 11/23/2016 08/19/2014 01/22/2013 01/22/2013  PHQ - 2 Score 0 0 0 0 0 0 0  PHQ- 9 Score 0 - - - - - -    Fall Risk Fall Risk  02/03/2020 01/08/2018 11/25/2017 01/07/2017 11/23/2016  Falls in the past year? 1 No No No No  Number falls in past yr: 0 - - - -  Injury with Fall? 1 - - - -  Comment Yes broke great toe on left foot - - - -  Risk for fall due to : Impaired balance/gait - - - -  Follow up Falls evaluation completed;Falls prevention discussed - - - -    Any stairs in or around the home? No  If so, are there any without handrails? No  Home free of loose throw rugs in walkways, pet beds, electrical cords, etc? Yes  Adequate lighting in your home to reduce risk of falls? Yes   ASSISTIVE DEVICES UTILIZED TO PREVENT FALLS:  Life alert? No  Use of a cane, walker or w/c? No  Grab bars in the bathroom? Yes  Shower chair or bench in shower? Yes  Elevated toilet seat or a handicapped toilet? Yes   Cognitive Function: MMSE - Mini Mental State Exam 11/23/2016  Not completed: (No Data)     6CIT Screen 02/03/2020  What Year? 0 points  What month? 0 points  What time? 0 points  Count back from 20 0 points  Months in reverse 0 points  Repeat phrase 0 points  Total Score 0    Immunizations Immunization History  Administered Date(s) Administered  . Fluad Quad(high Dose 65+) 04/20/2019  . Influenza Split 04/26/2011, 04/30/2012  . Influenza Whole 05/05/2010  . Influenza, High Dose Seasonal PF 05/12/2015, 04/13/2016, 05/02/2017, 04/29/2018  . Influenza,inj,Quad  PF,6+ Mos 05/07/2013, 05/05/2014  . Moderna SARS-COVID-2 Vaccination 08/20/2019, 09/17/2019  . Pneumococcal Conjugate-13  01/18/2015  . Pneumococcal Polysaccharide-23 01/22/2013  . Td 01/28/2007  . Tdap 01/07/2017  . Zoster 02/05/2011    TDAP status: Up to date Flu Vaccine status: Up to date Pneumococcal vaccine status: Up to date Covid-19 vaccine status: Completed vaccines  Qualifies for Shingles Vaccine? Yes   Zostavax completed Yes   Shingrix Completed?: No.    Education has been provided regarding the importance of this vaccine. Patient has been advised to call insurance company to determine out of pocket expense if they have not yet received this vaccine. Advised may also receive vaccine at local pharmacy or Health Dept. Verbalized acceptance and understanding.  Screening Tests Health Maintenance  Topic Date Due  . COLONOSCOPY  11/25/2018  . INFLUENZA VACCINE  02/14/2020  . TETANUS/TDAP  01/08/2027  . COVID-19 Vaccine  Completed  . Hepatitis C Screening  Completed  . PNA vac Low Risk Adult  Completed    Health Maintenance  Health Maintenance Due  Topic Date Due  . COLONOSCOPY  11/25/2018    Colorectal cancer screening: Referral to GI placed 02/03/2020. Pt aware the office will call re: appt.  Lung Cancer Screening: (Low Dose CT Chest recommended if Age 48-80 years, 30 pack-year currently smoking OR have quit w/in 15years.) does not qualify.   Lung Cancer Screening Referral: N/A  Additional Screening:  Hepatitis C Screening: does qualify; Completed 11/23/2016  Vision Screening: Recommended annual ophthalmology exams for early detection of glaucoma and other disorders of the eye. Is the patient up to date with their annual eye exam?  Yes  Who is the provider or what is the name of the office in which the patient attends annual eye exams? [facility]  If pt is not established with a provider, would they like to be referred to a provider to establish care? No .   Dental Screening: Recommended annual dental exams for proper oral hygiene  Community Resource Referral / Chronic Care  Management: CRR required this visit?  No   CCM required this visit?  No      Plan:     I have personally reviewed and noted the following in the patient's chart:   . Medical and social history . Use of alcohol, tobacco or illicit drugs  . Current medications and supplements . Functional ability and status . Nutritional status . Physical activity . Advanced directives . List of other physicians . Hospitalizations, surgeries, and ER visits in previous 12 months . Vitals . Screenings to include cognitive, depression, and falls . Referrals and appointments  In addition, I have reviewed and discussed with patient certain preventive protocols, quality metrics, and best practice recommendations. A written personalized care plan for preventive services as well as general preventive health recommendations were provided to patient.     Ofilia Neas, LPN   02/01/9469   Nurse Notes: Patient would like to get his Bone Density scheduled in August at [facility] on [address] at the same time as his wife. Will need it to be scheduled 02/20/2020

## 2020-02-04 DIAGNOSIS — M5136 Other intervertebral disc degeneration, lumbar region: Secondary | ICD-10-CM | POA: Diagnosis not present

## 2020-02-04 DIAGNOSIS — Z1211 Encounter for screening for malignant neoplasm of colon: Secondary | ICD-10-CM | POA: Diagnosis not present

## 2020-02-04 DIAGNOSIS — M1990 Unspecified osteoarthritis, unspecified site: Secondary | ICD-10-CM | POA: Diagnosis not present

## 2020-02-04 DIAGNOSIS — Z882 Allergy status to sulfonamides status: Secondary | ICD-10-CM | POA: Diagnosis not present

## 2020-02-04 DIAGNOSIS — R0602 Shortness of breath: Secondary | ICD-10-CM | POA: Diagnosis not present

## 2020-02-04 DIAGNOSIS — K5790 Diverticulosis of intestine, part unspecified, without perforation or abscess without bleeding: Secondary | ICD-10-CM | POA: Diagnosis not present

## 2020-02-04 DIAGNOSIS — R569 Unspecified convulsions: Secondary | ICD-10-CM | POA: Diagnosis not present

## 2020-02-04 DIAGNOSIS — Z86010 Personal history of colonic polyps: Secondary | ICD-10-CM | POA: Diagnosis not present

## 2020-02-04 DIAGNOSIS — Z880 Allergy status to penicillin: Secondary | ICD-10-CM | POA: Diagnosis not present

## 2020-02-04 DIAGNOSIS — Z881 Allergy status to other antibiotic agents status: Secondary | ICD-10-CM | POA: Diagnosis not present

## 2020-02-04 DIAGNOSIS — K5733 Diverticulitis of large intestine without perforation or abscess with bleeding: Secondary | ICD-10-CM | POA: Diagnosis not present

## 2020-02-05 DIAGNOSIS — R569 Unspecified convulsions: Secondary | ICD-10-CM | POA: Diagnosis not present

## 2020-02-05 DIAGNOSIS — Z880 Allergy status to penicillin: Secondary | ICD-10-CM | POA: Diagnosis not present

## 2020-02-05 DIAGNOSIS — Z881 Allergy status to other antibiotic agents status: Secondary | ICD-10-CM | POA: Diagnosis not present

## 2020-02-05 DIAGNOSIS — Z86010 Personal history of colonic polyps: Secondary | ICD-10-CM | POA: Diagnosis not present

## 2020-02-05 DIAGNOSIS — M1990 Unspecified osteoarthritis, unspecified site: Secondary | ICD-10-CM | POA: Diagnosis not present

## 2020-02-05 DIAGNOSIS — K5733 Diverticulitis of large intestine without perforation or abscess with bleeding: Secondary | ICD-10-CM | POA: Diagnosis not present

## 2020-02-05 DIAGNOSIS — K5730 Diverticulosis of large intestine without perforation or abscess without bleeding: Secondary | ICD-10-CM | POA: Diagnosis not present

## 2020-02-05 DIAGNOSIS — Z1211 Encounter for screening for malignant neoplasm of colon: Secondary | ICD-10-CM | POA: Diagnosis not present

## 2020-02-05 DIAGNOSIS — K5790 Diverticulosis of intestine, part unspecified, without perforation or abscess without bleeding: Secondary | ICD-10-CM | POA: Diagnosis not present

## 2020-02-05 DIAGNOSIS — M5136 Other intervertebral disc degeneration, lumbar region: Secondary | ICD-10-CM | POA: Diagnosis not present

## 2020-02-05 DIAGNOSIS — R0602 Shortness of breath: Secondary | ICD-10-CM | POA: Diagnosis not present

## 2020-02-05 DIAGNOSIS — Z882 Allergy status to sulfonamides status: Secondary | ICD-10-CM | POA: Diagnosis not present

## 2020-02-05 LAB — HM COLONOSCOPY

## 2020-02-22 ENCOUNTER — Encounter: Payer: Self-pay | Admitting: Family Medicine

## 2020-02-22 DIAGNOSIS — M810 Age-related osteoporosis without current pathological fracture: Secondary | ICD-10-CM

## 2020-02-23 ENCOUNTER — Encounter: Payer: Self-pay | Admitting: Family Medicine

## 2020-02-24 ENCOUNTER — Ambulatory Visit (INDEPENDENT_AMBULATORY_CARE_PROVIDER_SITE_OTHER)
Admission: RE | Admit: 2020-02-24 | Discharge: 2020-02-24 | Disposition: A | Discharge status: Home / Self Care | Payer: Medicare PPO | Source: Ambulatory Visit

## 2020-02-24 ENCOUNTER — Other Ambulatory Visit: Payer: Self-pay

## 2020-02-24 DIAGNOSIS — M810 Age-related osteoporosis without current pathological fracture: Secondary | ICD-10-CM

## 2020-02-25 ENCOUNTER — Encounter: Payer: Self-pay | Admitting: Podiatry

## 2020-02-25 ENCOUNTER — Ambulatory Visit: Payer: Medicare PPO | Admitting: Podiatry

## 2020-02-25 ENCOUNTER — Other Ambulatory Visit: Payer: Self-pay

## 2020-02-25 ENCOUNTER — Ambulatory Visit (INDEPENDENT_AMBULATORY_CARE_PROVIDER_SITE_OTHER): Payer: Medicare PPO

## 2020-02-25 DIAGNOSIS — S90121A Contusion of right lesser toe(s) without damage to nail, initial encounter: Secondary | ICD-10-CM

## 2020-02-25 NOTE — Progress Notes (Signed)
He presents today with chief complaint of a painful fourth toe of the right foot.  He states that the injury occurred yesterday by jamming the toe into the back of his shoe when he was trying to put his shoe on.  He states that it was black and blue and tender severe yesterday better throughout the night and not nearly as tender today.  Denies any changes in his past medical history medications or allergies.  Objective: Vital signs are stable he is alert oriented x3 the distalmost aspect of the fourth toe right foot demonstrates ecchymosis and edema.  Tenderness on palpation does not appear to be unstable medially or laterally dorsally or plantarly.  Radiographs taken today do demonstrate no stress fractures no avulsions of the toe.  No displacement of any cortices.  Assessment: Would be contusion toe fourth right.  Plan: At this point I demonstrated to him how to dress the toe with Coban he understands that and is amenable to it I will follow-up with him as needed.

## 2020-03-14 DIAGNOSIS — L821 Other seborrheic keratosis: Secondary | ICD-10-CM | POA: Diagnosis not present

## 2020-03-14 DIAGNOSIS — B353 Tinea pedis: Secondary | ICD-10-CM | POA: Diagnosis not present

## 2020-03-14 DIAGNOSIS — L111 Transient acantholytic dermatosis [Grover]: Secondary | ICD-10-CM | POA: Diagnosis not present

## 2020-03-14 DIAGNOSIS — D1801 Hemangioma of skin and subcutaneous tissue: Secondary | ICD-10-CM | POA: Diagnosis not present

## 2020-03-14 DIAGNOSIS — D2239 Melanocytic nevi of other parts of face: Secondary | ICD-10-CM | POA: Diagnosis not present

## 2020-03-14 DIAGNOSIS — R208 Other disturbances of skin sensation: Secondary | ICD-10-CM | POA: Diagnosis not present

## 2020-03-14 DIAGNOSIS — D224 Melanocytic nevi of scalp and neck: Secondary | ICD-10-CM | POA: Diagnosis not present

## 2020-04-12 ENCOUNTER — Encounter: Payer: Self-pay | Admitting: Podiatry

## 2020-04-12 ENCOUNTER — Ambulatory Visit (INDEPENDENT_AMBULATORY_CARE_PROVIDER_SITE_OTHER): Payer: Medicare PPO | Admitting: Podiatry

## 2020-04-12 ENCOUNTER — Other Ambulatory Visit: Payer: Self-pay

## 2020-04-12 DIAGNOSIS — M79676 Pain in unspecified toe(s): Secondary | ICD-10-CM

## 2020-04-12 DIAGNOSIS — B351 Tinea unguium: Secondary | ICD-10-CM | POA: Diagnosis not present

## 2020-04-12 DIAGNOSIS — Q828 Other specified congenital malformations of skin: Secondary | ICD-10-CM

## 2020-04-12 NOTE — Progress Notes (Signed)
He presents today chief complaint of painful callus subsecond metatarsophalangeal joint of the right foot as well as painful elongated toenails.  Objective: Vital signs are stable alert oriented x3.  Pulses are palpable.  Porokeratotic lesion subsecond metatarsophalangeal joint right foot no erythema cellulitis drainage or odor no open lesions.  Toenails are long thick yellow dystrophic and clinically mycotic.  Tinea pedis dorsal aspect left foot  Assessment: Painful callus plantar aspect right foot painful elongated mycotic nails.  Tinea pedis dorsal aspect left foot  Plan: Debridement of toenails bilaterally.  Debrided reactive hyperkeratotic lesion and recommended over-the-counter antifungal cream.

## 2020-04-29 DIAGNOSIS — Z880 Allergy status to penicillin: Secondary | ICD-10-CM | POA: Diagnosis not present

## 2020-04-29 DIAGNOSIS — Z809 Family history of malignant neoplasm, unspecified: Secondary | ICD-10-CM | POA: Diagnosis not present

## 2020-04-29 DIAGNOSIS — G40909 Epilepsy, unspecified, not intractable, without status epilepticus: Secondary | ICD-10-CM | POA: Diagnosis not present

## 2020-04-29 DIAGNOSIS — Z833 Family history of diabetes mellitus: Secondary | ICD-10-CM | POA: Diagnosis not present

## 2020-04-29 DIAGNOSIS — N529 Male erectile dysfunction, unspecified: Secondary | ICD-10-CM | POA: Diagnosis not present

## 2020-05-10 DIAGNOSIS — H0102B Squamous blepharitis left eye, upper and lower eyelids: Secondary | ICD-10-CM | POA: Diagnosis not present

## 2020-05-10 DIAGNOSIS — H04123 Dry eye syndrome of bilateral lacrimal glands: Secondary | ICD-10-CM | POA: Diagnosis not present

## 2020-05-10 DIAGNOSIS — H11043 Peripheral pterygium, stationary, bilateral: Secondary | ICD-10-CM | POA: Diagnosis not present

## 2020-05-10 DIAGNOSIS — H0102A Squamous blepharitis right eye, upper and lower eyelids: Secondary | ICD-10-CM | POA: Diagnosis not present

## 2020-05-10 DIAGNOSIS — H2513 Age-related nuclear cataract, bilateral: Secondary | ICD-10-CM | POA: Diagnosis not present

## 2020-05-10 DIAGNOSIS — H43813 Vitreous degeneration, bilateral: Secondary | ICD-10-CM | POA: Diagnosis not present

## 2020-06-07 ENCOUNTER — Encounter: Payer: Self-pay | Admitting: Podiatry

## 2020-06-07 ENCOUNTER — Other Ambulatory Visit: Payer: Self-pay

## 2020-06-07 ENCOUNTER — Ambulatory Visit: Payer: Medicare PPO | Admitting: Podiatry

## 2020-06-07 DIAGNOSIS — M79676 Pain in unspecified toe(s): Secondary | ICD-10-CM

## 2020-06-07 DIAGNOSIS — L989 Disorder of the skin and subcutaneous tissue, unspecified: Secondary | ICD-10-CM

## 2020-06-07 DIAGNOSIS — B351 Tinea unguium: Secondary | ICD-10-CM | POA: Diagnosis not present

## 2020-06-07 NOTE — Progress Notes (Signed)
He presents today chief complaint of painfully elongated toenails and calluses plantar aspect bilateral foot.  Objective: Vital signs are stable alert and oriented x3 pulses are palpable.  Hammertoe deformities noted bilateral.  Some osteoarthritic changes of the toes.  He also has sharp incurvated nail margins thick yellow dystrophic-like mycotic nails multiple reactive hyper keratomas plantar aspect of the forefoot bilateral right greater than left.  Assessment: Pain in limb secondary to onychomycosis hammertoe deformity and porokeratosis.  Plan: Debridement of toenails 1 through 5 bilaterally.  Debridement of all reactive hyperkeratoses.

## 2020-06-16 ENCOUNTER — Other Ambulatory Visit: Payer: Self-pay

## 2020-06-16 ENCOUNTER — Other Ambulatory Visit: Payer: Self-pay | Admitting: Family Medicine

## 2020-06-16 MED ORDER — PHENOBARBITAL 60 MG PO TABS: 60.0000 mg | ORAL_TABLET | Freq: Every day | ORAL | 1 refills | Status: DC

## 2020-06-23 ENCOUNTER — Encounter: Payer: Self-pay | Admitting: Family Medicine

## 2020-06-27 ENCOUNTER — Other Ambulatory Visit: Payer: Self-pay | Admitting: Family Medicine

## 2020-06-27 NOTE — Telephone Encounter (Signed)
This was just filled on 06-16-20.  Do we know why this request is coming over again?

## 2020-07-01 ENCOUNTER — Encounter: Payer: Self-pay | Admitting: Family Medicine

## 2020-07-01 MED ORDER — PHENOBARBITAL 60 MG PO TABS: 60.0000 mg | ORAL_TABLET | Freq: Every day | ORAL | 1 refills | Status: DC

## 2020-07-01 NOTE — Telephone Encounter (Signed)
I have re-sent this to [facility].

## 2020-07-04 ENCOUNTER — Other Ambulatory Visit: Payer: Self-pay

## 2020-07-04 ENCOUNTER — Ambulatory Visit (INDEPENDENT_AMBULATORY_CARE_PROVIDER_SITE_OTHER): Payer: Medicare PPO

## 2020-07-04 ENCOUNTER — Encounter: Payer: Self-pay | Admitting: Family Medicine

## 2020-07-04 ENCOUNTER — Ambulatory Visit: Payer: Medicare PPO | Admitting: Family Medicine

## 2020-07-04 VITALS — BP 122/78 | HR 66 | Ht 70.0 in | Wt 146.0 lb

## 2020-07-04 DIAGNOSIS — M79672 Pain in left foot: Secondary | ICD-10-CM

## 2020-07-04 DIAGNOSIS — G2581 Restless legs syndrome: Secondary | ICD-10-CM | POA: Diagnosis not present

## 2020-07-04 DIAGNOSIS — M85872 Other specified disorders of bone density and structure, left ankle and foot: Secondary | ICD-10-CM | POA: Diagnosis not present

## 2020-07-04 MED ORDER — PRAMIPEXOLE DIHYDROCHLORIDE 0.125 MG PO TABS: ORAL_TABLET | ORAL | 5 refills | Status: DC

## 2020-07-04 NOTE — Progress Notes (Signed)
Established Patient Office Visit  Subjective:  Patient ID: Nathan [name], male    DOB: 1946/06/05  Age: 74 y.o. MRN: [id number]  CC:  Chief Complaint  Patient presents with  . Edema    HPI Nathan [name] presents for evaluation of left foot pain.  He states that Friday night he had very restless night with very little sleep.  Saturday he was sitting in a chair and basically fell asleep and fell out of the chair.  He had abrasion to the left leg.  He noticed some left foot pain that night which is fairly intense.  After he fell he had no recollection of any immediate foot pain was able to ambulate most of the day without difficulty.  He has noticed a little bit of edema of the foot since then.  He is ambulating today without much difficulty.  No localized tenderness.  He does relate restless leg syndrome type symptoms over the past year progressive in nature.  He states he is getting very little sleep some nights frequently taking up to 4 hours to get to sleep.  He does get some relief when he gets up and moves around.  He has constant sensation of legs needing to move.  Improved with ambulation.  He does not use caffeine any in the afternoons or at night.  No recent iron deficiency to his knowledge.  Past Medical History:  Diagnosis Date  . Colon polyps    Sessile Serrated adenomas  . DYSPNEA 02/09/2009  . RESTLESS LEGS SYNDROME 02/28/2009  . SEIZURE DISORDER 02/09/2009   last seisure Sep 10, 2001    Past Surgical History:  Procedure Laterality Date  . COLONOSCOPY    . HERNIA REPAIR Right 02/11/09   dr. Donella Stade -[facility]  . HERNIA REPAIR Left   . LAPAROSCOPIC APPENDECTOMY  06/01/2011   Procedure: APPENDECTOMY LAPAROSCOPIC;  Surgeon: Shann Medal, MD;  Location: [facility];  Service: General;  Laterality: N/A;  . POLYPECTOMY      Family History  Problem Relation Age of Onset  . Cancer Mother        unknown type but thinks "male organs"  . Cancer Father        multiple myeloma  .  Diabetes Mellitus II Father   . Stroke Neg Hx        grandparent  . Colon cancer Neg Hx   . Esophageal cancer Neg Hx   . Rectal cancer Neg Hx   . Stomach cancer Neg Hx   . Pancreatic cancer Neg Hx   . Liver disease Neg Hx     Social History   Socioeconomic History  . Marital status: Married    Spouse name: Nathan [name] Headings  . Number of children: 0  . Years of education: 2 MA's   . Highest education level: Not on file  Occupational History  . Occupation: Retired Product manager: [employer]: Part Time: [employer]  Tobacco Use  . Smoking status: Never Smoker  . Smokeless tobacco: Never Used  Vaping Use  . Vaping Use: Never used  Substance and Sexual Activity  . Alcohol use: No    Alcohol/week: 0.0 standard drinks  . Drug use: No  . Sexual activity: Not on file  Other Topics Concern  . Not on file  Social History Narrative   Patient lives at home with spouse.   Caffeine Use: quit 1980   Lives in a one story home. No chidren.  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 Frequency of Communication with Friends and Family: More than three times a week  . Frequency of Social Gatherings with Friends and Family: Three times a week  . Attends Religious Services: More than 4 times per year  . Active Member of Clubs or Organizations: No  . Attends Archivist Meetings: Never  . Marital Status: Married  Human resources officer Violence: Not on file    Outpatient Medications Prior to Visit  Medication Sig Dispense Refill  . cholecalciferol (VITAMIN D) 1000 UNITS tablet Take 1,000 Units by mouth daily.     . Cod Liver Oil OIL Take 5 mLs by mouth daily.      . Coenzyme Q10 (CO Q 10) 60 MG CAPS  Take 1 tablet by mouth daily.      . cyanocobalamin 500 MCG tablet Take 1,000 mcg by mouth daily.    . folic acid (FOLVITE) 1 MG tablet Take 1 mg by mouth daily.    Marland Kitchen GAVILYTE-G 236 g solution     . Homeopathic Products (SIMILASAN DRY EYE RELIEF) SOLN Apply 1 drop to eye daily as needed. Dry Eyes     . OVER THE COUNTER MEDICATION AlgeaCal    . OVER THE COUNTER MEDICATION Strontium citrate    . PHENObarbital (LUMINAL) 60 MG tablet Take 1 tablet (60 mg total) by mouth at bedtime. 90 tablet 1  . Probiotic Product (PROBIOTIC DAILY PO) Take by mouth daily.    . vitamin C (ASCORBIC ACID) 500 MG tablet Take 500 mg by mouth 3 (three) times daily.      Marland Kitchen zinc gluconate 50 MG tablet Take 50 mg by mouth daily.    Marland Kitchen BISACODYL 5 MG EC tablet See admin instructions. (Patient not taking: Reported on 02/03/2020)    . ondansetron (ZOFRAN-ODT) 4 MG disintegrating tablet  (Patient not taking: Reported on 02/03/2020)     No facility-administered medications prior to visit.    Allergies  Allergen Reactions  . Suprep [Na Sulfate-K Sulfate-Mg Sulf] Nausea And Vomiting    Hyponatremia leading to seizure  . Carbamazepine     REACTION: fatigue, sleepy  . Ciprofloxacin     Per pt, "thinks caused rash"  . Clindamycin/Lincomycin Diarrhea  . Metronidazole     REACTION: GI upset  . Penicillins     REACTION: childhood, hives?  . Phenytoin     REACTION: rash, leg swelling  . Sulfamethoxazole Other (See Comments)  . Trimethoprim Other (See Comments)  . Bactrim Other (See Comments)    Caused headaches, that progressively worsened when on medication. Once off, they went away.  . Phenytoin Sodium Extended Swelling and Rash    Swelling of ankles. Happened years ago per patient.    ROS Review of Systems  Constitutional: Negative for fatigue.  Eyes: Negative for visual disturbance.  Respiratory: Negative for cough, chest tightness and shortness of breath.   Cardiovascular: Negative for chest pain, palpitations  and leg swelling.  Genitourinary: Negative for dysuria.  Neurological: Negative for dizziness, syncope, weakness, light-headedness and headaches.  Psychiatric/Behavioral: Positive for sleep disturbance.      Objective:    Physical Exam Vitals reviewed.  Cardiovascular:     Rate and Rhythm:  Normal rate and regular rhythm.  Pulmonary:     Effort: Pulmonary effort is normal.     Breath sounds: Normal breath sounds.  Musculoskeletal:     Comments: Left foot reveals some varicosities.  No localized bony tenderness.  Good distal foot pulses.  He does have some edema of the left foot which may be dependent edema related to the left leg injury.  No significant tibia or fibula tenderness.  Skin:    Comments: Superficial abrasion left anterior leg.  No signs of secondary infection.    Neurological:     Mental Status: He is alert.     BP 122/78   Pulse 66   Ht $R'5\' 10"'CC$  (1.778 m)   Wt 146 lb (66.2 kg)   SpO2 98%   BMI 20.95 kg/m  Wt Readings from Last 3 Encounters:  07/04/20 146 lb (66.2 kg)  10/20/19 145 lb 6.4 oz (66 kg)  09/15/19 148 lb (67.1 kg)     Health Maintenance Due  Topic Date Due  . INFLUENZA VACCINE  02/14/2020  . COVID-19 Vaccine (3 - Booster for Moderna series) 03/19/2020    There are no preventive care reminders to display for this patient.  Lab Results  Component Value Date   TSH 1.64 10/20/2019   Lab Results  Component Value Date   WBC 3.1 (L) 10/20/2019   HGB 14.0 10/20/2019   HCT 40.4 10/20/2019   MCV 95.5 10/20/2019   PLT 165.0 10/20/2019   Lab Results  Component Value Date   NA 140 10/20/2019   K 4.3 10/20/2019   CO2 34 (H) 10/20/2019   GLUCOSE 94 10/20/2019   BUN 10 10/20/2019   CREATININE 0.70 10/20/2019   BILITOT 0.7 10/20/2019   ALKPHOS 110 10/20/2019   AST 22 10/20/2019   ALT 26 10/20/2019   PROT 6.3 10/20/2019   ALBUMIN 4.5 10/20/2019   CALCIUM 9.5 10/20/2019   ANIONGAP 6 12/27/2018   GFR 110.64 10/20/2019   Lab Results   Component Value Date   CHOL 168 10/20/2019   Lab Results  Component Value Date   HDL 94.10 10/20/2019   Lab Results  Component Value Date   LDLCALC 66 10/20/2019   Lab Results  Component Value Date   TRIG 36.0 10/20/2019   Lab Results  Component Value Date   CHOLHDL 2 10/20/2019   No results found for: HGBA1C    Assessment & Plan:   Problem List Items Addressed This Visit   None   Visit Diagnoses    Restless legs    -  Primary   Relevant Orders   Ferritin   Left foot pain       Relevant Orders   DG Foot Complete Left    Patient presents following fall with left foot swelling which is relatively mild and some very poorly localized pain.  His pain is actually improved slightly today.  He is requesting x-rays which will be obtained but low clinical suspicion for fracture.  If x-ray is negative reassurance and observe  He has evidence for restless leg syndrome by history.  Symptoms are progressive and becoming very debilitating -Check ferritin level -Continue to avoid caffeine -Consider short-term trial of Mirapex 0.125 mg nightly and may increase to 0.25 mg if necessary -1 month follow-up to reassess  Meds ordered this encounter  Medications  . pramipexole (MIRAPEX) 0.125 MG tablet    Sig: Take one to two tablets at night for restless legs.    Dispense:  60  tablet    Refill:  5    Follow-up: Return in about 1 month (around 08/04/2020).    Carolann Littler, MD

## 2020-07-04 NOTE — Addendum Note (Signed)
Addended by: Janann Colonel on: 07/04/2020 02:37 PM   Modules accepted: Orders

## 2020-07-04 NOTE — Patient Instructions (Signed)

## 2020-07-05 LAB — FERRITIN: Ferritin: 184 ng/mL (ref 24–380)

## 2020-07-09 ENCOUNTER — Other Ambulatory Visit: Payer: Self-pay

## 2020-07-09 ENCOUNTER — Emergency Department (HOSPITAL_COMMUNITY): Payer: Medicare PPO

## 2020-07-09 ENCOUNTER — Encounter (HOSPITAL_COMMUNITY): Payer: Self-pay | Admitting: Emergency Medicine

## 2020-07-09 ENCOUNTER — Emergency Department (HOSPITAL_COMMUNITY)
Admission: EM | Admit: 2020-07-09 | Discharge: 2020-07-09 | Disposition: A | Discharge status: Home / Self Care | Payer: Medicare PPO | Attending: Emergency Medicine | Admitting: Emergency Medicine

## 2020-07-09 DIAGNOSIS — Z4800 Encounter for change or removal of nonsurgical wound dressing: Secondary | ICD-10-CM | POA: Diagnosis not present

## 2020-07-09 DIAGNOSIS — M79662 Pain in left lower leg: Secondary | ICD-10-CM | POA: Diagnosis not present

## 2020-07-09 DIAGNOSIS — Z5189 Encounter for other specified aftercare: Secondary | ICD-10-CM

## 2020-07-09 DIAGNOSIS — M7989 Other specified soft tissue disorders: Secondary | ICD-10-CM | POA: Diagnosis not present

## 2020-07-09 LAB — CBC WITH DIFFERENTIAL/PLATELET
Abs Immature Granulocytes: 0 10*3/uL (ref 0.00–0.07)
Basophils Absolute: 0.1 10*3/uL (ref 0.0–0.1)
Basophils Relative: 1 %
Eosinophils Absolute: 0.1 10*3/uL (ref 0.0–0.5)
Eosinophils Relative: 2 %
HCT: 39.9 % (ref 39.0–52.0)
Hemoglobin: 13.4 g/dL (ref 13.0–17.0)
Immature Granulocytes: 0 %
Lymphocytes Relative: 27 %
Lymphs Abs: 1.3 10*3/uL (ref 0.7–4.0)
MCH: 32.4 pg (ref 26.0–34.0)
MCHC: 33.6 g/dL (ref 30.0–36.0)
MCV: 96.4 fL (ref 80.0–100.0)
Monocytes Absolute: 0.8 10*3/uL (ref 0.1–1.0)
Monocytes Relative: 16 %
Neutro Abs: 2.5 10*3/uL (ref 1.7–7.7)
Neutrophils Relative %: 54 %
Platelets: 183 10*3/uL (ref 150–400)
RBC: 4.14 MIL/uL — ABNORMAL LOW (ref 4.22–5.81)
RDW: 12.7 % (ref 11.5–15.5)
WBC: 4.7 10*3/uL (ref 4.0–10.5)
nRBC: 0 % (ref 0.0–0.2)

## 2020-07-09 LAB — COMPREHENSIVE METABOLIC PANEL
ALT: 32 U/L (ref 0–44)
AST: 25 U/L (ref 15–41)
Albumin: 4.5 g/dL (ref 3.5–5.0)
Alkaline Phosphatase: 99 U/L (ref 38–126)
Anion gap: 9 (ref 5–15)
BUN: 16 mg/dL (ref 8–23)
CO2: 30 mmol/L (ref 22–32)
Calcium: 8.8 mg/dL — ABNORMAL LOW (ref 8.9–10.3)
Chloride: 97 mmol/L — ABNORMAL LOW (ref 98–111)
Creatinine, Ser: 0.66 mg/dL (ref 0.61–1.24)
GFR, Estimated: >60 mL/min (ref >60)
Glucose, Bld: 103 mg/dL — ABNORMAL HIGH (ref 70–99)
Potassium: 4.1 mmol/L (ref 3.5–5.1)
Sodium: 136 mmol/L (ref 135–145)
Total Bilirubin: 0.3 mg/dL (ref 0.3–1.2)
Total Protein: 7.2 g/dL (ref 6.5–8.1)

## 2020-07-09 LAB — LACTIC ACID, PLASMA: Lactic Acid, Venous: 0.8 mmol/L (ref 0.5–1.9)

## 2020-07-09 MED ORDER — DOXYCYCLINE HYCLATE 100 MG PO CAPS: 100.0000 mg | ORAL_CAPSULE | Freq: Two times a day (BID) | ORAL | 0 refills | Status: AC

## 2020-07-09 NOTE — ED Notes (Signed)
Wound marked using skin marker prior to discharge per PA request.

## 2020-07-09 NOTE — Discharge Instructions (Addendum)
Please keep the area of the wound clean and dry.  Take antibiotic as directed until finished.  Please make sure to follow-up with your primary care doctor within a week to make sure it is healing properly.  If the redness is spreading past the mark on your shin, please seek medical help.  Return to the ER for any new or worsening symptoms.

## 2020-07-09 NOTE — ED Triage Notes (Signed)
Patient reports fall x1 week ago with worsening redness and swelling to wound on left lower leg x2 days.

## 2020-07-09 NOTE — ED Provider Notes (Signed)
[facility] DEPT Provider Note   CSN: [id number] Arrival date & time: 07/09/20  1715     History Chief Complaint  Patient presents with  . Wound Check    Nathan [name] is a 74 y.o. male.  HPI 74 year old male with a history of seizures, osteoporosis, restless leg syndrome presents to the ER with complaints of a wound to his left lower shin.  Patient states he fell last Saturday and had a skin tear on his leg.  Saw PCP several days later, had an x-ray of his left ankle which was all fairly normal.  States that there was a possible very small fracture but this was unclear of the x-ray.  He states his ankle has improved significantly, he has been able to ambulate for the most part.  He states that the wound on his shin was healing okay, however seem to regress over the last few days and has had increased redness and mild pain.  Denies any fevers or chills.  No history of diabetes.  Denies any numbness or tingling in lower extremities.    Past Medical History:  Diagnosis Date  . Colon polyps    Sessile Serrated adenomas  . DYSPNEA 02/09/2009  . RESTLESS LEGS SYNDROME 02/28/2009  . SEIZURE DISORDER 02/09/2009   last seisure Sep 10, 2001    Patient Active Problem List   Diagnosis Date Noted  . Hereditary and idiopathic neuropathy 11/17/2019  . 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 Seizures ([place]) 12/25/2018  . Osteoporosis 12/07/2016  . Adverse effect of anticonvulsants 12/07/2016  . Numbness in feet 04/28/2014  . Inguinal hernia, left 04/02/2013  . B12 deficiency 07/02/2012  . H/O adenomatous polyp of colon 07/02/2012  . Intra-abdominal fluid collection 06/18/2011  . Fluid overload 06/06/2011  . Ileus, postoperative ([place]) 06/06/2011  . Appendicitis with abscess, appendectomy 06/01/2011. 06/01/2011  . RESTLESS LEGS SYNDROME 02/28/2009  . History of seizure 02/09/2009  . DYSPNEA 02/09/2009    Past Surgical History:   Procedure Laterality Date  . COLONOSCOPY    . HERNIA REPAIR Right 02/11/09   dr. Donella Stade -[facility]  . HERNIA REPAIR Left   . LAPAROSCOPIC APPENDECTOMY  06/01/2011   Procedure: APPENDECTOMY LAPAROSCOPIC;  Surgeon: Shann Medal, MD;  Location: [facility];  Service: General;  Laterality: N/A;  . POLYPECTOMY         Family History  Problem Relation Age of Onset  . Cancer Mother        unknown type but thinks "male organs"  . Cancer Father        multiple myeloma  . Diabetes Mellitus II Father   . Stroke Neg Hx        grandparent  . Colon cancer Neg Hx   . Esophageal cancer Neg Hx   . Rectal cancer Neg Hx   . Stomach cancer Neg Hx   . Pancreatic cancer Neg Hx   . Liver disease Neg Hx     Social History   Tobacco Use  . Smoking status: Never Smoker  . Smokeless tobacco: Never Used  Vaping Use  . Vaping Use: Never used  Substance Use Topics  . Alcohol use: No    Alcohol/week: 0.0 standard drinks  . Drug use: No    Home Medications Prior to Admission medications   Medication Sig Start Date End Date Taking? Authorizing Provider  cholecalciferol (VITAMIN D) 1000 UNITS tablet Take 1,000 Units by mouth daily.     [provider]  Cod Liver Oil OIL Take 5 mLs by mouth daily.      [provider]  Coenzyme Q10 (CO Q 10) 60 MG CAPS Take 1 tablet by mouth daily.      [provider]  cyanocobalamin 500 MCG tablet Take 1,000 mcg by mouth daily.    [provider]  doxycycline (VIBRAMYCIN) 100 MG capsule Take 1 capsule (100 mg total) by mouth 2 (two) times daily for 7 days. 07/09/20 07/16/20  Garald Balding, PA-C  folic acid (FOLVITE) 1 MG tablet Take 1 mg by mouth daily.    [provider]  GAVILYTE-G 236 g solution  05/05/19   [provider]  Homeopathic Products [facility] DRY EYE RELIEF) SOLN Apply 1 drop to eye daily as needed. Dry Eyes     [provider]  OVER THE COUNTER MEDICATION AlgeaCal    [provider]  OVER THE COUNTER MEDICATION Strontium citrate    [provider]  PHENObarbital (LUMINAL) 60 MG tablet Take 1 tablet (60 mg total) by mouth at bedtime. 07/01/20   Burchette, Alinda Sierras, MD  pramipexole (MIRAPEX) 0.125 MG tablet Take one to two tablets at night for restless legs. 07/04/20   Burchette, Alinda Sierras, MD  Probiotic Product (PROBIOTIC DAILY PO) Take by mouth daily.    [provider]  vitamin C (ASCORBIC ACID) 500 MG tablet Take 500 mg by mouth 3 (three) times daily.      [provider]  zinc gluconate 50 MG tablet Take 50 mg by mouth daily.    [provider]    Allergies    Suprep [na sulfate-k sulfate-mg sulf], Carbamazepine, Ciprofloxacin, Clindamycin/lincomycin, Metronidazole, Penicillins, Phenytoin, Sulfamethoxazole, Trimethoprim, Bactrim, and Phenytoin sodium extended  Review of Systems   Review of Systems  Constitutional: Negative for fever.  Skin: Positive for color change and wound.  Neurological: Negative for weakness and numbness.    Physical Exam Updated Vital Signs BP 117/79   Pulse 65   Temp 98.1 F (36.7 C) (Oral)   Resp 17   SpO2 100%   Physical Exam Vitals reviewed.  Constitutional:      Appearance: Normal appearance.  HENT:     Head: Normocephalic and atraumatic.  Eyes:     General:        Right eye: No discharge.        Left eye: No discharge.     Extraocular Movements: Extraocular movements intact.     Conjunctiva/sclera: Conjunctivae normal.  Musculoskeletal:        General: No swelling. Normal range of motion.  Skin:    Comments: 3 to 4 cm wound on the anterior left shin with surrounding erythema.  Mildly warm to touch.  No tappable fluctuance, no drainage, areas of necrosis.  2+ DP pulses bilateral  Sensations intact.  Patient ambulating in the ED without difficulty.  Neurological:     General: No focal deficit present.     Mental Status: He is alert and oriented to person, place, and time.   Psychiatric:        Mood and Affect: Mood normal.        Behavior: Behavior normal.       ED Results / Procedures / Treatments   Labs (all labs ordered are listed, but only abnormal results are displayed) Labs Reviewed  COMPREHENSIVE METABOLIC PANEL - Abnormal; Notable for the following components:      Result Value   Chloride 97 (*)  Glucose, Bld 103 (*)    Calcium 8.8 (*)    All other components within normal limits  CBC WITH DIFFERENTIAL/PLATELET - Abnormal; Notable for the following components:   RBC 4.14 (*)    All other components within normal limits  LACTIC ACID, PLASMA  LACTIC ACID, PLASMA    EKG None  Radiology DG Tibia/Fibula Left  Result Date: 07/09/2020 CLINICAL DATA:  Pain status post fall. EXAM: LEFT TIBIA AND FIBULA - 2 VIEW COMPARISON:  July 04, 2020 FINDINGS: There is osteopenia. There is no acute displaced fracture. No dislocation. There is some soft tissue swelling about the ankle. There is no radiopaque foreign body. IMPRESSION: No acute displaced fracture or dislocation. Soft tissue swelling about the ankle. Electronically Signed   By: Constance Holster M.D.   On: 07/09/2020 20:21    Procedures Procedures (including critical care time)  Medications Ordered in ED Medications - No data to display  ED Course  I have reviewed the triage vital signs and the nursing notes.  Pertinent labs & imaging results that were available during my care of the patient were reviewed by me and considered in my medical decision making (see chart for details).    MDM Rules/Calculators/A&P                         74 year old male with a wound to his left lower shin after a fall.  Evidence of abscess on exam.  Afebrile, lab work overall reassuring.  No signs of sepsis.  Plain films not evidence of osteomyelitis.  Patient will be sent home on doxycycline, wound was marked.  Patient instructed to seek medical care if the redness continues to spread.  Encouraged  PCP follow-up within the week.  Return precautions discussed.  He voiced understanding and is agreeable.  This was a shared visit with my supervising physician Dr. Tamera Punt who independently saw and evaluated the patient & provided guidance in evaluation/management/disposition ,in agreement with care   Final Clinical Impression(s) / ED Diagnoses Final diagnoses:  Visit for wound check    Rx / DC Orders ED Discharge Orders         Ordered    doxycycline (VIBRAMYCIN) 100 MG capsule  2 times daily        07/09/20 2104           Garald Balding, PA-C 07/09/20 2110    Malvin Johns, MD 07/10/20 1303

## 2020-07-12 ENCOUNTER — Ambulatory Visit: Payer: Medicare PPO | Admitting: Family Medicine

## 2020-07-12 ENCOUNTER — Other Ambulatory Visit: Payer: Self-pay

## 2020-07-12 ENCOUNTER — Encounter: Payer: Self-pay | Admitting: Family Medicine

## 2020-07-12 VITALS — BP 120/80 | HR 64 | Ht 70.0 in | Wt 144.0 lb

## 2020-07-12 DIAGNOSIS — L03116 Cellulitis of left lower limb: Secondary | ICD-10-CM | POA: Diagnosis not present

## 2020-07-12 MED ORDER — CEPHALEXIN 500 MG PO CAPS: 500.0000 mg | ORAL_CAPSULE | Freq: Four times a day (QID) | ORAL | 0 refills | Status: DC

## 2020-07-12 NOTE — Patient Instructions (Signed)

## 2020-07-12 NOTE — Progress Notes (Signed)
Established Patient Office Visit  Subjective:  Patient ID: Nathan [name], male    DOB: [birth year]  Age: 74 y.o. MRN: [id number]  CC:  Chief Complaint  Patient presents with   Wound Check    HPI Nathan [name] presents for follow-up regarding wound and cellulitis of the leg.  He was seen in the ER on Christmas day.  He had been seen here last week but at that point had no cellulitis changes.  He developed some redness on the 23rd and went into the ER on the 25th.  He had no fever.  White count was normal.  Lactic acid level normal.  He was placed on doxycycline and tolerating okay.  He does have reported allergy to penicillin but no anaphylaxis with that.  Previous intolerance with sulfa, quinolones, and clindamycin.  He thinks the redness may have spread slightly.  Still has no fevers or chills.  No nausea or vomiting.  Slightly painful.  Past Medical History:  Diagnosis Date   Colon polyps    Sessile Serrated adenomas   DYSPNEA 02/09/2009   RESTLESS LEGS SYNDROME 02/28/2009   SEIZURE DISORDER 02/09/2009   last seisure Sep 10, 2001    Past Surgical History:  Procedure Laterality Date   COLONOSCOPY     HERNIA REPAIR Right 02/11/09   dr. Donella Stade -[facility]   HERNIA REPAIR Left    LAPAROSCOPIC APPENDECTOMY  06/01/2011   Procedure: APPENDECTOMY LAPAROSCOPIC;  Surgeon: Shann Medal, MD;  Location: [facility];  Service: General;  Laterality: N/A;   POLYPECTOMY      Family History  Problem Relation Age of Onset   Cancer Mother        unknown type but thinks "male organs"   Cancer Father        multiple myeloma   Diabetes Mellitus II Father    Stroke Neg Hx        grandparent   Colon cancer Neg Hx    Esophageal cancer Neg Hx    Rectal cancer Neg Hx    Stomach cancer Neg Hx    Pancreatic cancer Neg Hx    Liver disease Neg Hx     Social History   Socioeconomic History   Marital status: Married    Spouse name: Thayer Headings   Number of children: 0   Years of  education: 2 MA's    Highest education level: Not on file  Occupational History   Occupation: Retired Product manager: [employer]    Comment: Part Time: [employer]  Tobacco Use   Smoking status: Never Smoker   Smokeless tobacco: Never Used  Scientific laboratory technician Use: Never used  Substance and Sexual Activity   Alcohol use: No    Alcohol/week: 0.0 standard drinks   Drug use: No   Sexual activity: Not on file  Other Topics Concern   Not on file  Social History Narrative   Patient lives at home with spouse.   Caffeine Use: quit 1980   Lives in a one story home. No chidren.   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)   Social Determinants of Health   Financial Resource Strain: Not on file  Food Insecurity: Not on file  Transportation Needs: Not on file  Physical Activity: Not on file  Stress: Not on file  Social Connections: Moderately Integrated   Frequency of Communication with Friends and Family: More than three times a week   Frequency  of Social Gatherings with Friends and Family: Three times a week   Attends Religious Services: More than 4 times per year   Active Member of Clubs or Organizations: No   Attends Archivist Meetings: Never   Marital Status: Married  Human resources officer Violence: Not on file    Outpatient Medications Prior to Visit  Medication Sig Dispense Refill   cholecalciferol (VITAMIN D) 1000 UNITS tablet Take 1,000 Units by mouth daily.      Cod Liver Oil OIL Take 5 mLs by mouth daily.       Coenzyme Q10 (CO Q 10) 60 MG CAPS Take 1 tablet by mouth daily.       cyanocobalamin 500 MCG tablet Take 1,000 mcg by mouth daily.     doxycycline (VIBRAMYCIN) 100 MG capsule Take 1 capsule (100 mg total) by mouth 2 (two) times daily for 7 days. 14 capsule 0   folic acid (FOLVITE) 1 MG tablet Take 1 mg by mouth daily.     GAVILYTE-G 236 g solution      Homeopathic Products (SIMILASAN DRY EYE  RELIEF) SOLN Apply 1 drop to eye daily as needed. Dry Eyes      OVER THE COUNTER MEDICATION AlgeaCal     OVER THE COUNTER MEDICATION Strontium citrate     PHENObarbital (LUMINAL) 60 MG tablet Take 1 tablet (60 mg total) by mouth at bedtime. 90 tablet 1   pramipexole (MIRAPEX) 0.125 MG tablet Take one to two tablets at night for restless legs. 60 tablet 5   Probiotic Product (PROBIOTIC DAILY PO) Take by mouth daily.     vitamin C (ASCORBIC ACID) 500 MG tablet Take 500 mg by mouth 3 (three) times daily.       zinc gluconate 50 MG tablet Take 50 mg by mouth daily.     No facility-administered medications prior to visit.    Allergies  Allergen Reactions   Suprep [Na Sulfate-K Sulfate-Mg Sulf] Nausea And Vomiting    Hyponatremia leading to seizure   Carbamazepine     REACTION: fatigue, sleepy   Ciprofloxacin     Per pt, "thinks caused rash"   Clindamycin/Lincomycin Diarrhea   Metronidazole     REACTION: GI upset   Penicillins     REACTION: childhood, hives?   Phenytoin     REACTION: rash, leg swelling   Sulfamethoxazole Other (See Comments)   Trimethoprim Other (See Comments)   Bactrim Other (See Comments)    Caused headaches, that progressively worsened when on medication. Once off, they went away.   Phenytoin Sodium Extended Swelling and Rash    Swelling of ankles. Happened years ago per patient.    ROS Review of Systems  Constitutional: Negative for chills and fever.      Objective:    Physical Exam Vitals reviewed.  Constitutional:      Appearance: Normal appearance.  Cardiovascular:     Rate and Rhythm: Normal rate and regular rhythm.  Skin:    Comments: Left anterior shin reveals small wound.  He has good granulation tissue filling in.  He does have area of erythema which is approximately 3 x 4 cm surrounding this area.  Not much change from the line that was drawn around this in the ER but perhaps slightly extended.  No significant fluctuance.   Slightly tender to palpation.  Neurological:     Mental Status: He is alert.     BP 120/80    Pulse 64    Ht _0  (  1.778 m)    Wt 144 lb (65.3 kg)    BMI 20.66 kg/m  Wt Readings from Last 3 Encounters:  07/12/20 144 lb (65.3 kg)  07/04/20 146 lb (66.2 kg)  10/20/19 145 lb 6.4 oz (66 kg)     Health Maintenance Due  Topic Date Due   INFLUENZA VACCINE  02/14/2020   COVID-19 Vaccine (3 - Booster for Moderna series) 03/19/2020    There are no preventive care reminders to display for this patient.  Lab Results  Component Value Date   TSH 1.64 10/20/2019   Lab Results  Component Value Date   WBC 4.7 07/09/2020   HGB 13.4 07/09/2020   HCT 39.9 07/09/2020   MCV 96.4 07/09/2020   PLT 183 07/09/2020   Lab Results  Component Value Date   NA 136 07/09/2020   K 4.1 07/09/2020   CO2 30 07/09/2020   GLUCOSE 103 (H) 07/09/2020   BUN 16 07/09/2020   CREATININE 0.66 07/09/2020   BILITOT 0.3 07/09/2020   ALKPHOS 99 07/09/2020   AST 25 07/09/2020   ALT 32 07/09/2020   PROT 7.2 07/09/2020   ALBUMIN 4.5 07/09/2020   CALCIUM 8.8 (L) 07/09/2020   ANIONGAP 9 07/09/2020   GFR 110.64 10/20/2019   Lab Results  Component Value Date   CHOL 168 10/20/2019   Lab Results  Component Value Date   HDL 94.10 10/20/2019   Lab Results  Component Value Date   LDLCALC 66 10/20/2019   Lab Results  Component Value Date   TRIG 36.0 10/20/2019   Lab Results  Component Value Date   CHOLHDL 2 10/20/2019   No results found for: HGBA1C    Assessment & Plan:   Cellulitis left leg from recent injury.  This does not appear dramatically worse from few days ago but also not clearly better  -Add Keflex 500 mg 4 times daily for 7 days for strep coverage and continue doxycycline -Continue frequent elevation.  Touch base within a few days if not improving and sooner for any progressive redness or fever  Meds ordered this encounter  Medications   cephALEXin (KEFLEX) 500 MG capsule     Sig: Take 1 capsule (500 mg total) by mouth 4 (four) times daily.    Dispense:  28 capsule    Refill:  0    Follow-up: No follow-ups on file.    Carolann Littler, MD

## 2020-07-13 ENCOUNTER — Telehealth: Payer: Self-pay | Admitting: Family Medicine

## 2020-07-13 NOTE — Telephone Encounter (Signed)
Left message for patient to call back to discuss concerns.

## 2020-07-13 NOTE — Telephone Encounter (Signed)
Pt is calling in stating that he has cellulitis on a wound on his L leg and he is wanting to know what he should do if it gets worse today and before the end of the week.  Pt stated that he got his antibiotic on yesterday and would like to make sure that if he gets worse he would like to know what to do.  Pt would like to have a call a back.

## 2020-07-19 ENCOUNTER — Encounter: Payer: Self-pay | Admitting: Family Medicine

## 2020-07-26 DIAGNOSIS — S80812S Abrasion, left lower leg, sequela: Secondary | ICD-10-CM | POA: Diagnosis not present

## 2020-07-26 DIAGNOSIS — L245 Irritant contact dermatitis due to other chemical products: Secondary | ICD-10-CM | POA: Diagnosis not present

## 2020-08-04 ENCOUNTER — Encounter: Payer: Self-pay | Admitting: Podiatry

## 2020-08-04 ENCOUNTER — Ambulatory Visit: Payer: Medicare PPO | Admitting: Podiatry

## 2020-08-04 ENCOUNTER — Other Ambulatory Visit: Payer: Self-pay

## 2020-08-04 DIAGNOSIS — D2371 Other benign neoplasm of skin of right lower limb, including hip: Secondary | ICD-10-CM | POA: Diagnosis not present

## 2020-08-04 DIAGNOSIS — M79676 Pain in unspecified toe(s): Secondary | ICD-10-CM

## 2020-08-04 DIAGNOSIS — D2372 Other benign neoplasm of skin of left lower limb, including hip: Secondary | ICD-10-CM

## 2020-08-04 DIAGNOSIS — B351 Tinea unguium: Secondary | ICD-10-CM

## 2020-08-04 NOTE — Progress Notes (Signed)
He presents today for follow-up of his painful elongated toenails and calluses bilaterally.  Objective: Vital signs are stable he is alert and oriented x3 pulses remain palpable toenails are long thick yellow dystrophic with mycotic sharply incurvated painful palpation.  He has reactive benign skin lesions to the plantar aspect of the forefoot bilaterally.  Assessment: Porokeratosis benign skin lesions forefoot bilateral.  Pain Onychomycosis.  Plan: Debridement of toenails 1 through 5 bilaterally debridement of benign skin lesions.  Follow-up with him in 9 weeks

## 2020-09-29 ENCOUNTER — Other Ambulatory Visit: Payer: Self-pay | Admitting: Family Medicine

## 2020-09-29 NOTE — Telephone Encounter (Signed)
Last refill- 07/01/2020---90 tabs with 1 refill Last office visit- 07/12/2020  Next office visit- 10/26/2020

## 2020-10-11 ENCOUNTER — Ambulatory Visit: Payer: Medicare PPO | Admitting: Podiatry

## 2020-10-11 ENCOUNTER — Encounter: Payer: Self-pay | Admitting: Podiatry

## 2020-10-11 ENCOUNTER — Other Ambulatory Visit: Payer: Self-pay

## 2020-10-11 DIAGNOSIS — M79676 Pain in unspecified toe(s): Secondary | ICD-10-CM | POA: Diagnosis not present

## 2020-10-11 DIAGNOSIS — D2372 Other benign neoplasm of skin of left lower limb, including hip: Secondary | ICD-10-CM

## 2020-10-11 DIAGNOSIS — B351 Tinea unguium: Secondary | ICD-10-CM

## 2020-10-11 DIAGNOSIS — D2371 Other benign neoplasm of skin of right lower limb, including hip: Secondary | ICD-10-CM

## 2020-10-11 NOTE — Progress Notes (Signed)
He presents today for painful elongated toenails.  Objective: Vital signs are stable he is alert oriented x3 pulses are palpable multiple varicosities dorsal aspect of the bilateral foot toenails are long thick yellow dystrophic-like mycotic sharply incurvated painful palpation.  His calluses today are not very thick.  But they are present.  Assessment: Pain in limb secondary to onychomycosis.  Plan: Debridement of toenails 1 through 5 bilateral.  We will follow-up with him in a couple of months.

## 2020-10-26 ENCOUNTER — Ambulatory Visit (INDEPENDENT_AMBULATORY_CARE_PROVIDER_SITE_OTHER): Payer: Medicare PPO | Admitting: Family Medicine

## 2020-10-26 ENCOUNTER — Other Ambulatory Visit: Payer: Self-pay

## 2020-10-26 ENCOUNTER — Encounter: Payer: Self-pay | Admitting: Family Medicine

## 2020-10-26 VITALS — BP 120/68 | HR 58 | Temp 97.8°F | Wt 146.0 lb

## 2020-10-26 DIAGNOSIS — G40909 Epilepsy, unspecified, not intractable, without status epilepticus: Secondary | ICD-10-CM | POA: Diagnosis not present

## 2020-10-26 DIAGNOSIS — Z0000 Encounter for general adult medical examination without abnormal findings: Secondary | ICD-10-CM

## 2020-10-26 LAB — BASIC METABOLIC PANEL
BUN: 13 mg/dL (ref 6–23)
CO2: 35 mEq/L — ABNORMAL HIGH (ref 19–32)
Calcium: 9.7 mg/dL (ref 8.4–10.5)
Chloride: 100 mEq/L (ref 96–112)
Creatinine, Ser: 0.73 mg/dL (ref 0.40–1.50)
GFR: 90.13 mL/min (ref >60.00)
Glucose, Bld: 90 mg/dL (ref 70–99)
Potassium: 4.1 mEq/L (ref 3.5–5.1)
Sodium: 141 mEq/L (ref 135–145)

## 2020-10-26 LAB — LIPID PANEL
Cholesterol: 167 mg/dL (ref 0–200)
HDL: 98.8 mg/dL (ref >39.00)
LDL Cholesterol: 61 mg/dL (ref 0–99)
NonHDL: 68.3
Total CHOL/HDL Ratio: 2
Triglycerides: 37 mg/dL (ref 0.0–149.0)
VLDL: 7.4 mg/dL (ref 0.0–40.0)

## 2020-10-26 LAB — HEPATIC FUNCTION PANEL
ALT: 20 U/L (ref 0–53)
AST: 18 U/L (ref 0–37)
Albumin: 4.4 g/dL (ref 3.5–5.2)
Alkaline Phosphatase: 106 U/L (ref 39–117)
Bilirubin, Direct: 0.1 mg/dL (ref 0.0–0.3)
Total Bilirubin: 0.6 mg/dL (ref 0.2–1.2)
Total Protein: 6.6 g/dL (ref 6.0–8.3)

## 2020-10-26 LAB — CBC WITH DIFFERENTIAL/PLATELET
Basophils Absolute: 0.1 10*3/uL (ref 0.0–0.1)
Basophils Relative: 1.7 % (ref 0.0–3.0)
Eosinophils Absolute: 0.1 10*3/uL (ref 0.0–0.7)
Eosinophils Relative: 3.1 % (ref 0.0–5.0)
HCT: 40.9 % (ref 39.0–52.0)
Hemoglobin: 14.2 g/dL (ref 13.0–17.0)
Lymphocytes Relative: 32.4 % (ref 12.0–46.0)
Lymphs Abs: 1.2 10*3/uL (ref 0.7–4.0)
MCHC: 34.6 g/dL (ref 30.0–36.0)
MCV: 94.1 fl (ref 78.0–100.0)
Monocytes Absolute: 0.6 10*3/uL (ref 0.1–1.0)
Monocytes Relative: 16.6 % — ABNORMAL HIGH (ref 3.0–12.0)
Neutro Abs: 1.7 10*3/uL (ref 1.4–7.7)
Neutrophils Relative %: 46.2 % (ref 43.0–77.0)
Platelets: 171 10*3/uL (ref 150.0–400.0)
RBC: 4.35 Mil/uL (ref 4.22–5.81)
RDW: 13.6 % (ref 11.5–15.5)
WBC: 3.7 10*3/uL — ABNORMAL LOW (ref 4.0–10.5)

## 2020-10-26 LAB — TSH: TSH: 1.99 u[IU]/mL (ref 0.35–4.50)

## 2020-10-26 LAB — PSA: PSA: 0.92 ng/mL (ref 0.10–4.00)

## 2020-10-26 NOTE — Progress Notes (Signed)
Established Patient Office Visit  Subjective:  Patient ID: Nathan [name], male    DOB: 08-21-1945  Age: 75 y.o. MRN: [id number]  CC:  Chief Complaint  Patient presents with  . Annual Exam    Pt has a few concerns he would like to discuss with Dr. Elease Hashimoto    HPI Nathan [name] presents for physical exam.  He has history of hereditary/idiopathic neuropathy, osteoporosis, history of seizure, restless leg syndrome.  Generally doing well.  No recent falls.  He and his wife are looking at changing retirement communities possibly the summer to a place in [place].  Health maintenance reviewed-completely up-to-date with exception of no history of Shingrix vaccine. Last DEXA scan was in August of this year.  Family history-mother died of some type of male cancer.  Either uterine or ovarian likely from history.  Patient not sure.  Father died of multiple myeloma complications 10.  No siblings.  No family history of premature CAD or diabetes.  Social history-married for 45 years.  No children.  Never smoked.  No alcohol.  Retired from [employer].  Rides exercise bike few times per week and does some free weights.  Past Medical History:  Diagnosis Date  . Colon polyps    Sessile Serrated adenomas  . DYSPNEA 02/09/2009  . RESTLESS LEGS SYNDROME 02/28/2009  . SEIZURE DISORDER 02/09/2009   last seisure Sep 10, 2001    Past Surgical History:  Procedure Laterality Date  . COLONOSCOPY    . HERNIA REPAIR Right 02/11/09   dr. Donella Stade -[facility]  . HERNIA REPAIR Left   . LAPAROSCOPIC APPENDECTOMY  06/01/2011   Procedure: APPENDECTOMY LAPAROSCOPIC;  Surgeon: Shann Medal, MD;  Location: [facility];  Service: General;  Laterality: N/A;  . POLYPECTOMY      Family History  Problem Relation Age of Onset  . Cancer Mother        unknown type but thinks "male organs"  . Cancer Father        multiple myeloma  . Diabetes Mellitus II Father   . Stroke Neg Hx        grandparent  . Colon cancer Neg Hx    . Esophageal cancer Neg Hx   . Rectal cancer Neg Hx   . Stomach cancer Neg Hx   . Pancreatic cancer Neg Hx   . Liver disease Neg Hx     Social History   Socioeconomic History  . Marital status: Married    Spouse name: Thayer Headings  . Number of children: 0  . Years of education: 2 MA's   . Highest education level: Not on file  Occupational History  . 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 Smoking status: Never Smoker  . Smokeless tobacco: Never Used  Vaping Use  . Vaping Use: Never used  Substance and Sexual Activity  . Alcohol use: No    Alcohol/week: 0.0 standard drinks  . Drug use: No  . Sexual activity: Not on file  Other Topics Concern  . Not on file  Social History Narrative   Patient lives at home with spouse.   Caffeine Use: quit 1980   Lives in a one story home. No chidren.   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 Frequency of Communication with Friends and Family: More than three times a week  . Frequency of Social Gatherings with Friends and Family: Three times a week  . Attends Religious Services: More than 4 times per year  . Active Member of Clubs or Organizations: No  . Attends Archivist Meetings: Never  . Marital Status: Married  Human resources officer Violence: Not on file    Outpatient Medications Prior to Visit  Medication Sig Dispense Refill  . cholecalciferol (VITAMIN D) 1000 UNITS tablet Take 1,000 Units by mouth daily.    . Cod Liver Oil OIL Take 5 mLs by mouth daily.    . Coenzyme Q10 (CO Q 10) 60 MG CAPS Take 1 tablet by mouth daily.    . cyanocobalamin 500 MCG tablet Take 1,000 mcg by mouth daily.     . folic acid (FOLVITE) 1 MG tablet Take 1 mg by mouth daily.    Marland Kitchen GAVILYTE-G 236 g solution     . Homeopathic Products (SIMILASAN DRY EYE RELIEF) SOLN Apply 1 drop to eye daily as needed. Dry Eyes    . OVER THE COUNTER MEDICATION AlgeaCal    . OVER THE COUNTER MEDICATION Strontium citrate    . PHENObarbital (LUMINAL) 60 MG tablet TAKE ONE TABLET BY MOUTH AT BEDTIME 90 tablet 1  . pramipexole (MIRAPEX) 0.125 MG tablet Take one to two tablets at night for restless legs. 60 tablet 5  . Probiotic Product (PROBIOTIC DAILY PO) Take by mouth daily.    . vitamin C (ASCORBIC ACID) 500 MG tablet Take 500 mg by mouth 3 (three) times daily.    Marland Kitchen zinc gluconate 50 MG tablet Take 50 mg by mouth daily.    . cephALEXin (KEFLEX) 500 MG capsule Take 1 capsule (500 mg total) by mouth 4 (four) times daily. 28 capsule 0   No facility-administered medications prior to visit.    Allergies  Allergen Reactions  . Suprep [Na Sulfate-K Sulfate-Mg Sulf] Nausea And Vomiting    Hyponatremia leading to seizure  . Carbamazepine     REACTION: fatigue, sleepy  . Ciprofloxacin     Per pt, "thinks caused rash"  . Clindamycin/Lincomycin Diarrhea  . Metronidazole     REACTION: GI upset  . Penicillins     REACTION: childhood, hives?  . Phenytoin     REACTION: rash, leg swelling  . Sulfamethoxazole Other (See Comments)  . Trimethoprim Other (See Comments)  . Bactrim Other (See Comments)    Caused headaches, that progressively worsened when on medication. Once off, they went away.  . Phenytoin Sodium Extended Swelling and Rash    Swelling of ankles. Happened years ago per patient.    ROS Review of Systems  Constitutional: Negative for activity change, appetite change, fatigue and fever.  HENT: Negative for congestion, ear pain and trouble swallowing.   Eyes: Negative for pain and visual disturbance.  Respiratory: Negative for cough, shortness of breath and wheezing.   Cardiovascular: Negative for chest pain  and palpitations.  Gastrointestinal: Negative for abdominal distention, abdominal pain, blood in stool, constipation, diarrhea, nausea, rectal pain and vomiting.  Genitourinary: Negative for hematuria and testicular pain.       Does have some mild slow stream but no major obstructive urinary symptoms.  Musculoskeletal: Negative for arthralgias and joint swelling.  Skin: Negative for rash.  Neurological: Negative for dizziness, syncope and headaches.  Hematological: Negative for adenopathy.  Psychiatric/Behavioral: Negative for confusion and dysphoric mood.      Objective:    Physical Exam Vitals reviewed.  Constitutional:      General: He is not in acute distress.    Appearance: Normal appearance. He is well-developed.  HENT:     Head: Normocephalic and atraumatic.     Right Ear: External ear normal.     Left Ear: External ear normal.  Eyes:     Conjunctiva/sclera: Conjunctivae normal.     Pupils: Pupils are equal, round, and reactive to light.  Neck:     Thyroid: No thyromegaly.  Cardiovascular:     Rate and Rhythm: Normal rate and regular rhythm.     Heart sounds: Normal heart sounds. No murmur heard.   Pulmonary:     Effort: No respiratory distress.     Breath sounds: No wheezing or rales.  Abdominal:     General: Bowel sounds are normal. There is no distension.     Palpations: Abdomen is soft. There is no mass.     Tenderness: There is no abdominal tenderness. There is no guarding or rebound.  Genitourinary:    Comments: Rectal exam reveals significant amount of stool in the rectal vault.  Prostate is slightly enlarged but symmetric and nontender.  No rectal masses noted. Musculoskeletal:     Cervical back: Normal range of motion and neck supple.  Lymphadenopathy:     Cervical: No cervical adenopathy.  Skin:    Findings: No rash.  Neurological:     Mental Status: He is alert and oriented to person, place, and time.     Cranial Nerves: No cranial nerve deficit.      BP 120/68 (BP Location: Left Arm, Patient Position: Sitting, Cuff Size: Normal)   Pulse (!) 58   Temp 97.8 F (36.6 C) (Oral)   Wt 146 lb (66.2 kg)   SpO2 99%   BMI 20.95 kg/m  Wt Readings from Last 3 Encounters:  10/26/20 146 lb (66.2 kg)  07/12/20 144 lb (65.3 kg)  07/04/20 146 lb (66.2 kg)     Health Maintenance Due  Topic Date Due  . COVID-19 Vaccine (3 - Moderna risk 4-dose series) 10/15/2019    There are no preventive care reminders to display for this patient.  Lab Results  Component Value Date   TSH 1.64 10/20/2019   Lab Results  Component Value Date   WBC 4.7 07/09/2020   HGB 13.4 07/09/2020   HCT 39.9 07/09/2020   MCV 96.4 07/09/2020   PLT 183 07/09/2020   Lab Results  Component Value Date   NA 136 07/09/2020   K 4.1 07/09/2020   CO2 30 07/09/2020   GLUCOSE 103 (H) 07/09/2020   BUN 16 07/09/2020   CREATININE 0.66 07/09/2020   BILITOT 0.3 07/09/2020   ALKPHOS 99 07/09/2020   AST 25 07/09/2020   ALT 32 07/09/2020   PROT 7.2 07/09/2020   ALBUMIN 4.5 07/09/2020   CALCIUM 8.8 (L) 07/09/2020   ANIONGAP 9 07/09/2020   GFR 110.64 10/20/2019   Lab Results  Component Value Date   CHOL 168 10/20/2019   Lab Results  Component Value Date   HDL 94.10 10/20/2019   Lab Results  Component Value Date   LDLCALC 66 10/20/2019   Lab Results  Component Value Date   TRIG 36.0 10/20/2019   Lab Results  Component Value Date   CHOLHDL 2 10/20/2019   No results found for: HGBA1C  Assessment & Plan:   Problem List Items Addressed This Visit   None   Visit Diagnoses    Physical exam    -  Primary   Relevant Orders   Basic metabolic panel   Lipid panel   CBC with Differential/Platelet   TSH   Hepatic function panel   PSA   Seizure disorder (HCC)       Relevant Orders   Phenobarbital level    Generally healthy 75 year old male.  We discussed Shingrix vaccine but he declines at this time.  Other immunizations up-to-date.  Continue  regular weightbearing exercise.  Continue annual flu vaccine.  -Obtain screening labs.  Include phenobarbital level. -Consider repeat DEXA scan within the next 18 months  No orders of the defined types were placed in this encounter.   Follow-up: No follow-ups on file.    Carolann Littler, MD

## 2020-10-26 NOTE — Patient Instructions (Signed)
Preventive Care 74 Years and Older, Male Preventive care refers to lifestyle choices and visits with your health care provider that can promote health and wellness. This includes:  A yearly physical exam. This is also called an annual wellness visit.  Regular dental and eye exams.  Immunizations.  Screening for certain conditions.  Healthy lifestyle choices, such as: ? Eating a healthy diet. ? Getting regular exercise. ? Not using drugs or products that contain nicotine and tobacco. ? Limiting alcohol use. What can I expect for my preventive care visit? Physical exam Your health care provider will check your:  Height and weight. These may be used to calculate your BMI (body mass index). BMI is a measurement that tells if you are at a healthy weight.  Heart rate and blood pressure.  Body temperature.  Skin for abnormal spots. Counseling Your health care provider may ask you questions about your:  Past medical problems.  Family's medical history.  Alcohol, tobacco, and drug use.  Emotional well-being.  Home life and relationship well-being.  Sexual activity.  Diet, exercise, and sleep habits.  History of falls.  Memory and ability to understand (cognition).  Work and work environment.  Access to firearms. What immunizations do I need? Vaccines are usually given at various ages, according to a schedule. Your health care provider will recommend vaccines for you based on your age, medical history, and lifestyle or other factors, such as travel or where you work.   What tests do I need? Blood tests  Lipid and cholesterol levels. These may be checked every 5 years, or more often depending on your overall health.  Hepatitis C test.  Hepatitis B test. Screening  Lung cancer screening. You may have this screening every year starting at age 74 if you have a 30-pack-year history of smoking and currently smoke or have quit within the past 15 years.  Colorectal  cancer screening. ? All adults should have this screening starting at age 74 and continuing until age 75. ? Your health care provider may recommend screening at age 45 if you are at increased risk. ? You will have tests every 1-10 years, depending on your results and the type of screening test.  Prostate cancer screening. Recommendations will vary depending on your family history and other risks.  Genital exam to check for testicular cancer or hernias.  Diabetes screening. ? This is done by checking your blood sugar (glucose) after you have not eaten for a while (fasting). ? You may have this done every 1-3 years.  Abdominal aortic aneurysm (AAA) screening. You may need this if you are a current or former smoker.  STD (sexually transmitted disease) testing, if you are at risk. Follow these instructions at home: Eating and drinking  Eat a diet that includes fresh fruits and vegetables, whole grains, lean protein, and low-fat dairy products. Limit your intake of foods with high amounts of sugar, saturated fats, and salt.  Take vitamin and mineral supplements as recommended by your health care provider.  Do not drink alcohol if your health care provider tells you not to drink.  If you drink alcohol: ? Limit how much you have to 0-2 drinks a day. ? Be aware of how much alcohol is in your drink. In the [place] one drink equals one 12 oz bottle of beer (355 mL), one 5 oz glass of wine (148 mL), or one 1 oz glass of hard liquor (44 mL).   Lifestyle  Take daily care of your teeth   and gums. Brush your teeth every morning and night with fluoride toothpaste. Floss one time each day.  Stay active. Exercise for at least 30 minutes 5 or more days each week.  Do not use any products that contain nicotine or tobacco, such as cigarettes, e-cigarettes, and chewing tobacco. If you need help quitting, ask your health care provider.  Do not use drugs.  If you are sexually active, practice safe sex.  Use a condom or other form of protection to prevent STIs (sexually transmitted infections).  Talk with your health care provider about taking a low-dose aspirin or statin.  Find healthy ways to cope with stress, such as: ? Meditation, yoga, or listening to music. ? Journaling. ? Talking to a trusted person. ? Spending time with friends and family. Safety  Always wear your seat belt while driving or riding in a vehicle.  Do not drive: ? If you have been drinking alcohol. Do not ride with someone who has been drinking. ? When you are tired or distracted. ? While texting.  Wear a helmet and other protective equipment during sports activities.  If you have firearms in your house, make sure you follow all gun safety procedures. What's next?  Visit your health care provider once a year for an annual wellness visit.  Ask your health care provider how often you should have your eyes and teeth checked.  Stay up to date on all vaccines. This information is not intended to replace advice given to you by your health care provider. Make sure you discuss any questions you have with your health care provider. Document Revised: 03/31/2019 Document Reviewed: 06/26/2018 Elsevier Patient Education  2021 Elsevier Inc.  

## 2020-10-28 LAB — PHENOBARBITAL LEVEL: Phenobarbital, Serum: 6.7 mg/L — ABNORMAL LOW (ref 15.0–40.0)

## 2020-11-09 ENCOUNTER — Other Ambulatory Visit: Payer: Self-pay

## 2020-11-09 ENCOUNTER — Encounter: Payer: Self-pay | Admitting: Podiatry

## 2020-11-09 ENCOUNTER — Ambulatory Visit (INDEPENDENT_AMBULATORY_CARE_PROVIDER_SITE_OTHER): Payer: Medicare PPO

## 2020-11-09 ENCOUNTER — Other Ambulatory Visit: Payer: Self-pay | Admitting: Podiatry

## 2020-11-09 ENCOUNTER — Ambulatory Visit: Payer: Medicare PPO | Admitting: Podiatry

## 2020-11-09 DIAGNOSIS — M722 Plantar fascial fibromatosis: Secondary | ICD-10-CM

## 2020-11-09 DIAGNOSIS — M25471 Effusion, right ankle: Secondary | ICD-10-CM

## 2020-11-09 DIAGNOSIS — M778 Other enthesopathies, not elsewhere classified: Secondary | ICD-10-CM

## 2020-11-09 MED ORDER — METHYLPREDNISOLONE 4 MG PO TBPK: ORAL_TABLET | ORAL | 0 refills | Status: DC

## 2020-11-09 MED ORDER — TRIAMCINOLONE ACETONIDE 40 MG/ML IJ SUSP
20.0000 mg | Freq: Once | INTRAMUSCULAR | Status: AC
Start: 2020-11-09 — End: 2020-11-09
  Administered 2020-11-09: 20 mg

## 2020-11-09 NOTE — Progress Notes (Signed)
He presents today with 3-week history of pain and swelling to his right foot and leg.  He denies any calf pain.  States that he was stepping on milk cartons to crush them.  He states that the heel started to hurt and then since then he has been unable to walk normally.  Objective: Vital signs are stable he is alert oriented x3 has pain on palpation medial Cano tubercles of his right heel.  Radiographs do not demonstrate any fractures.  It does demonstrate some soft tissue swelling in the posterior anterior and inferior or plantar compartments of the leg and foot and ankle.  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  Does not appear to be a DVT.  Plan: At this point I injected the heel offered to start him on prednisone but he declined.  I will send the prednisone over to the pharmacy anyway in case he changes his mind.  I did inject 10 mg Kenalog 5 mg Marcaine to the point maximal tenderness.  It felt better as he was leaving the office today.

## 2020-12-13 ENCOUNTER — Ambulatory Visit: Payer: Medicare PPO | Admitting: Podiatry

## 2020-12-13 ENCOUNTER — Encounter: Payer: Self-pay | Admitting: Podiatry

## 2020-12-13 ENCOUNTER — Other Ambulatory Visit: Payer: Self-pay

## 2020-12-13 DIAGNOSIS — B351 Tinea unguium: Secondary | ICD-10-CM | POA: Diagnosis not present

## 2020-12-13 DIAGNOSIS — M79676 Pain in unspecified toe(s): Secondary | ICD-10-CM

## 2020-12-13 DIAGNOSIS — D2371 Other benign neoplasm of skin of right lower limb, including hip: Secondary | ICD-10-CM | POA: Diagnosis not present

## 2020-12-13 DIAGNOSIS — D2372 Other benign neoplasm of skin of left lower limb, including hip: Secondary | ICD-10-CM

## 2020-12-13 NOTE — Progress Notes (Signed)
He presents today chief complaint of painfully elongated toenails 1 through 5 bilaterally.  States that the Planter fasciitis seems to be doing better after the injections.  Objective: Pulses remain palpable no pain on palpation medial calcaneal tubercle.  He has thick yellow dystrophic-like mycotic nails painful palpation as well as debridement.  No iatrogenic lesions noted.  Assessment: Well-healing Planter fasciitis.  Pain in limb secondary to onychomycosis.  Plan: Debridement of toenails 1 through 5 bilateral.  Follow-up with him as needed

## 2020-12-30 NOTE — Progress Notes (Signed)
[place] [address] [place] Phone: [phone] Subjective:   I Nathan [name] am serving as a Education administrator for Dr. Hulan Saas.  This visit occurred during the SARS-CoV-2 public health emergency.  Safety protocols were in place, including screening questions prior to the visit, additional usage of staff PPE, and extensive cleaning of exam room while observing appropriate contact time as indicated for disinfecting solutions.   I'm seeing this patient by the request  of:  [name], MD  CC: Foot pain  [id number]  Nathan [name] is a 75 y.o. male coming in with complaint of plantar fasciitis. Patient has been seeing Podiatry.  Podiatry has been treating him for plantar fasciitis as well as onychomycosis.  Patient was given injection in the plantar fascia November 09, 2020 patient states the injections did not help. States he has had pain with walking and lying in the bed. Driving this morning caused his heel to hurt. Has improved since April. Sometimes he feels a tingle in his heel.   Onset- Chronic Location - right heel with swelling  Duration-  Character- sharp  Aggravating factors- walking, laying down Reliving factors-  Therapies tried- Ice, epsom salt, tumeric capsules  Severity- 4/10     Past Medical History:  Diagnosis Date   Colon polyps    Sessile Serrated adenomas   DYSPNEA 02/09/2009   RESTLESS LEGS SYNDROME 02/28/2009   SEIZURE DISORDER 02/09/2009   last seisure Sep 10, 2001   Past Surgical History:  Procedure Laterality Date   COLONOSCOPY     HERNIA REPAIR Right 02/11/09   dr. Donella Stade -[facility]   HERNIA REPAIR Left    LAPAROSCOPIC APPENDECTOMY  06/01/2011   Procedure: APPENDECTOMY LAPAROSCOPIC;  Surgeon: Shann Medal, MD;  Location: [facility];  Service: General;  Laterality: N/A;   POLYPECTOMY     Social History   Socioeconomic History   Marital status: Married    Spouse name: Thayer Headings   Number of children: 0    Years of education: 2 MA's    Highest education level: Not on file  Occupational History   Occupation: Retired Product manager: [employer]    Comment: Part Time: [employer]  Tobacco Use   Smoking status: Never   Smokeless tobacco: Never  Vaping Use   Vaping Use: Never used  Substance and Sexual Activity   Alcohol use: No    Alcohol/week: 0.0 standard drinks   Drug use: No   Sexual activity: Not on file  Other Topics Concern   Not on file  Social History Narrative   Patient lives at home with spouse.   Caffeine Use: quit 1980   Lives in a one story home. No chidren.   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)   Social Determinants of Health   Financial Resource Strain: Not on file  Food Insecurity: Not on file  Transportation Needs: Not on file  Physical Activity: Not on file  Stress: Not on file  Social Connections: Moderately Integrated   Frequency of Communication with Friends and Family: More than three times a week   Frequency of Social Gatherings with Friends and Family: Three times a week   Attends Religious Services: More than 4 times per year   Active Member of Clubs or Organizations: No   Attends Archivist Meetings: Never   Marital Status: Married   Allergies  Allergen Reactions   Suprep [Na Sulfate-K  Sulfate-Mg Sulf] Nausea And Vomiting    Hyponatremia leading to seizure   Carbamazepine     REACTION: fatigue, sleepy   Ciprofloxacin     Per pt, "thinks caused rash"   Clindamycin/Lincomycin Diarrhea   Metronidazole     REACTION: GI upset   Penicillins     REACTION: childhood, hives?   Phenytoin     REACTION: rash, leg swelling   Sulfamethoxazole Other (See Comments)   Trimethoprim Other (See Comments)   Bactrim Other (See Comments)    Caused headaches, that progressively worsened when on medication. Once off, they went away.   Phenytoin Sodium Extended Swelling and Rash    Swelling of ankles.  Happened years ago per patient.   Family History  Problem Relation Age of Onset   Cancer Mother        unknown type but thinks "male organs"   Cancer Father        multiple myeloma   Diabetes Mellitus II Father    Stroke Neg Hx        grandparent   Colon cancer Neg Hx    Esophageal cancer Neg Hx    Rectal cancer Neg Hx    Stomach cancer Neg Hx    Pancreatic cancer Neg Hx    Liver disease Neg Hx     Current Outpatient Medications (Endocrine & Metabolic):    methylPREDNISolone (MEDROL DOSEPAK) 4 MG TBPK tablet, 6 day dose pack - take as directed     Current Outpatient Medications (Hematological):    cyanocobalamin 500 MCG tablet, Take 1,000 mcg by mouth daily.   folic acid (FOLVITE) 1 MG tablet, Take 1 mg by mouth daily.  Current Outpatient Medications (Other):    cholecalciferol (VITAMIN D) 1000 UNITS tablet, Take 1,000 Units by mouth daily.   Cod Liver Oil OIL, Take 5 mLs by mouth daily.   Coenzyme Q10 (CO Q 10) 60 MG CAPS, Take 1 tablet by mouth daily.   GAVILYTE-G 236 g solution,    Homeopathic Products (SIMILASAN DRY EYE RELIEF) SOLN, Apply 1 drop to eye daily as needed. Dry Eyes   OVER THE COUNTER MEDICATION, AlgeaCal   OVER THE COUNTER MEDICATION, Strontium citrate   PHENObarbital (LUMINAL) 60 MG tablet, TAKE ONE TABLET BY MOUTH AT BEDTIME   pramipexole (MIRAPEX) 0.125 MG tablet, Take one to two tablets at night for restless legs.   Probiotic Product (PROBIOTIC DAILY PO), Take by mouth daily.   vitamin C (ASCORBIC ACID) 500 MG tablet, Take 500 mg by mouth 3 (three) times daily.   zinc gluconate 50 MG tablet, Take 50 mg by mouth daily.   Reviewed prior external information including notes and imaging from  primary care provider As well as notes that were available from care everywhere and other healthcare systems.  Past medical history, social, surgical and family history all reviewed in electronic medical record.  No pertanent information unless stated regarding  to the chief complaint.   Review of Systems:  No headache, visual changes, nausea, vomiting, diarrhea, constipation, dizziness, abdominal pain, skin rash, fevers, chills, night sweats, weight loss, swollen lymph nodes,  joint swelling, chest pain, shortness of breath, mood changes. POSITIVE muscle aches, body aches  Objective  Blood pressure 130/80, pulse 96, height $RemoveBe'5\' 10"'AhEgOnQwQ$  (1.778 m), weight 144 lb (65.3 kg), SpO2 97 %.   General: No apparent distress alert and oriented x3 mood and affect normal, dressed appropriately.  Patient is cachectic HEENT: Pupils equal, extraocular movements intact  Respiratory: Patient's speak in  full sentences and does not appear short of breath  Cardiovascular: No lower extremity edema, non tender, no erythema  Gait antalgic Foot exam on the right side shows the patient does have some breakdown of the longitudinal arch noted.  Patient does have very thin feet overall.  Patient does have tenderness to palpation diffusely.  Does seem to be mild neuropathy but seems to be symmetric to the contralateral side.  Dorsalis pedis pulses felt.  Mild tenderness noted over the Achilles insertion and mild tenderness over the plantar fascial itself.  Limited musculoskeletal ultrasound was performed and interpreted by Lyndal Pulley  Limited musculoskeletal ultrasound of patient's foot exam shows the patient does have a potential varicose vein versus a possible cortical irregularity noted of the calcaneal area at the insertion of the plantar fascia.  Mild hypoechoic changes at the plantar fascia noted.  Patient also has some very mild fat pad hypoechoic changes noted.  Achilles is intact but does have a retrocalcaneal bursitis noted. Impression: Nonspecific cortical irregularity of the plantar calcaneal region that could be consistent with a stress fracture as well as retrocalcaneal bursitis   97110; 15 additional minutes spent for Therapeutic exercises as stated in above notes.   This included exercises focusing on stretching, strengthening, with significant focus on eccentric aspects.   Long term goals include an improvement in range of motion, strength, endurance as well as avoiding reinjury. Patient's frequency would include in 1-2 times a day, 3-5 times a week for a duration of 6-12 weeks. Exercises for the foot include:  Stretches to help lengthen the lower leg and plantar fascia areas Theraband exercises for the lower leg and ankle to help strengthen the surrounding area- dorsiflexion, plantarflexion, inversion, eversion Massage rolling on the plantar surface of the foot with a frozen bottle, tennis ball or golf ball Towel or marble pick-ups to strengthen the plantar surface of the foot Weight bearing exercises to increase balance and overall stability   Proper technique shown and discussed handout in great detail with ATC.  All questions were discussed and answered.     Impression and Recommendations:     The above documentation has been reviewed and is accurate and complete Lyndal Pulley, DO

## 2021-01-03 ENCOUNTER — Ambulatory Visit (INDEPENDENT_AMBULATORY_CARE_PROVIDER_SITE_OTHER): Payer: Medicare PPO | Admitting: Family Medicine

## 2021-01-03 ENCOUNTER — Encounter: Payer: Self-pay | Admitting: Family Medicine

## 2021-01-03 ENCOUNTER — Other Ambulatory Visit: Payer: Self-pay

## 2021-01-03 ENCOUNTER — Ambulatory Visit (INDEPENDENT_AMBULATORY_CARE_PROVIDER_SITE_OTHER): Payer: Medicare PPO

## 2021-01-03 ENCOUNTER — Ambulatory Visit: Payer: Self-pay

## 2021-01-03 VITALS — BP 130/80 | HR 96 | Ht 70.0 in | Wt 144.0 lb

## 2021-01-03 DIAGNOSIS — M79671 Pain in right foot: Secondary | ICD-10-CM

## 2021-01-03 DIAGNOSIS — M25571 Pain in right ankle and joints of right foot: Secondary | ICD-10-CM | POA: Diagnosis not present

## 2021-01-03 NOTE — Patient Instructions (Addendum)
Good to see you Right ankle xray  Exercises 3 times a week.  Cam walker for the foot. Do not drive and come out of it with sitting [place] or oofos recovery sandals in the house.  Heel lift in your regular shoe  See me again in 4-6 weeks and if not better lets get MRI

## 2021-01-03 NOTE — Assessment & Plan Note (Signed)
Patient has had intractable heel pain.  In treating more for a plantar fasciitis.  Patient was having pain more with activity even without activity.  On ultrasound today patient does have mild chronic changes noted at the plantar fascial but appears to have potentially a cortical irregularity that could be associated with a stress fracture as well.  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  Patient was put in a cam walker today, discussed home exercises, discussed icing regimen follow-up with me again in 6 to 8 weeks.  Worsening pain MRI will be necessary.

## 2021-02-08 NOTE — Progress Notes (Signed)
[name] Phone: [phone] Subjective:    I'm seeing this patient by the request  of:  Nathan Post, MD  CC: Heel pain follow-up  [id number]  01/03/2021 Patient has had intractable heel pain.  In treating more for a plantar fasciitis.  Patient was having pain more with activity even without activity.  On ultrasound today patient does have mild chronic changes noted at the plantar fascial but appears to have potentially a cortical irregularity that could be associated with a stress fracture as well.  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  Patient was put in a cam walker today, discussed home exercises, discussed icing regimen follow-up with me again in 6 to 8 weeks.  Worsening pain MRI will be necessary.  Update 02/09/2021 Nathan [name] is a 75 y.o. male coming in with complaint of R foot pain. Patient states R foot has been feeling much better. Pt reports he stepped down hard in the shower the other day and experienced some pain. Pt c/o having a tingling sensation, but not pain in his R foot.  Patient states that he is feeling 1000% better at this time.  Feels the vitamin D did help.     Past Medical History:  Diagnosis Date   Colon polyps    Sessile Serrated adenomas   DYSPNEA 02/09/2009   RESTLESS LEGS SYNDROME 02/28/2009   SEIZURE DISORDER 02/09/2009   last seisure Sep 10, 2001   Past Surgical History:  Procedure Laterality Date   COLONOSCOPY     HERNIA REPAIR Right 02/11/09   dr. Donella [facility]   HERNIA REPAIR Left    LAPAROSCOPIC APPENDECTOMY  06/01/2011   Procedure: APPENDECTOMY LAPAROSCOPIC;  Surgeon: Shann Medal, MD;  Location: [facility];  Service: General;  Laterality: N/A;   POLYPECTOMY     Social History   Socioeconomic History   Marital status: Married    Spouse name: Nathan [name]   Number of children: 0   Years of  education: 2 MA's    Highest education level: Not on file  Occupational History   Occupation: Retired Product manager: [employer]    Comment: Part Time: [employer]  Tobacco Use   Smoking status: Never   Smokeless tobacco: Never  Vaping Use   Vaping Use: Never used  Substance and Sexual Activity   Alcohol use: No    Alcohol/week: 0.0 standard drinks   Drug use: No   Sexual activity: Not on file  Other Topics Concern   Not on file  Social History Narrative   Patient lives at home with spouse.   Caffeine Use: quit 1980   Lives in a one story home. No chidren.   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   Phenytoin     REACTION: rash, leg swelling   Sulfamethoxazole Other (See Comments)   Trimethoprim Other (See Comments)   Bactrim Other (See Comments)    Caused headaches, that progressively worsened when on medication. Once off, they went away.   Phenytoin Sodium Extended Swelling and Rash    Swelling of ankles. Happened years ago per patient.   Family History  Problem Relation Age of Onset   Cancer Mother        unknown type but thinks "male organs"   Cancer Father        multiple myeloma   Diabetes Mellitus II Father    Stroke Neg Hx        grandparent   Colon cancer Neg Hx    Esophageal cancer Neg Hx    Rectal cancer Neg Hx    Stomach cancer Neg Hx     Pancreatic cancer Neg Hx    Liver disease Neg Hx     Current Outpatient Medications (Endocrine & Metabolic):    methylPREDNISolone (MEDROL DOSEPAK) 4 MG TBPK tablet, 6 day dose pack - take as directed (Patient not taking: Reported on 02/09/2021)     Current Outpatient Medications (Hematological):    cyanocobalamin 500 MCG tablet, Take 1,000 mcg by mouth daily.   folic acid (FOLVITE) 1 MG tablet, Take 1 mg by mouth daily.  Current Outpatient Medications (Other):    cholecalciferol (VITAMIN D) 1000 UNITS tablet, Take 1,000 Units by mouth daily.   Cod Liver Oil OIL, Take 5 mLs by mouth daily.   Coenzyme Q10 (CO Q 10) 60 MG CAPS, Take 1 tablet by mouth daily.   GAVILYTE-G 236 g solution,    Homeopathic Products (SIMILASAN DRY EYE RELIEF) SOLN, Apply 1 drop to eye daily as needed. Dry Eyes   OVER THE COUNTER MEDICATION, AlgeaCal   OVER THE COUNTER MEDICATION, Strontium citrate   PHENObarbital (LUMINAL) 60 MG tablet, TAKE ONE TABLET BY MOUTH AT BEDTIME   pramipexole (MIRAPEX) 0.125 MG tablet, Take one to two tablets at night for restless legs.   Probiotic Product (PROBIOTIC DAILY PO), Take by mouth daily.   vitamin C (ASCORBIC ACID) 500 MG tablet, Take 500 mg by mouth 3 (three) times daily.   zinc gluconate 50 MG tablet, Take 50 mg by mouth daily.   Review of Systems:  No headache, visual changes, nausea, vomiting, diarrhea, constipation, dizziness, abdominal pain, skin rash, fevers, chills, night sweats, weight loss, swollen lymph nodes, body aches, joint swelling, chest pain, shortness of breath, mood changes.  Objective  Blood pressure 118/76, pulse 62, SpO2 98 %.   General: No apparent distress alert and oriented x3 mood and affect normal, dressed appropriately.  HEENT: Pupils equal, extraocular movements intact  Respiratory: Patient's speak in full sentences and does not appear short of breath  Cardiovascular: No lower extremity edema, non tender, no erythema  Gait very minorly  antalgic MSK:  Non tender with full range of motion and good stability and symmetric strength and tone of shoulders, elbows, wrist, hip, knee and ankles bilaterally.  Foot exam shows patient still has some mild varicose veins noted on the medial aspect.  Patient was nontender on exam.  No pain of the plantar aspect of the foot.   Impression and Recommendations:     The above documentation has been reviewed and is accurate and complete Lyndal Pulley, DO

## 2021-02-09 ENCOUNTER — Ambulatory Visit: Payer: Medicare PPO | Admitting: Family Medicine

## 2021-02-09 ENCOUNTER — Other Ambulatory Visit: Payer: Self-pay

## 2021-02-09 ENCOUNTER — Encounter: Payer: Self-pay | Admitting: Family Medicine

## 2021-02-09 DIAGNOSIS — M79671 Pain in right foot: Secondary | ICD-10-CM

## 2021-02-09 NOTE — Patient Instructions (Addendum)
Continue Vit D for one more month See me when you need me

## 2021-02-09 NOTE — Assessment & Plan Note (Signed)
Patient significantly improved at this time.  Discussed continuing the high-dose of vitamin D.  Increase activity slowly.  Follow-up with me again as needed

## 2021-02-10 ENCOUNTER — Encounter: Payer: Self-pay | Admitting: Neurology

## 2021-02-10 ENCOUNTER — Ambulatory Visit: Payer: Medicare PPO | Admitting: Neurology

## 2021-02-10 VITALS — BP 121/72 | HR 68 | Ht 71.0 in | Wt 147.0 lb

## 2021-02-10 DIAGNOSIS — G629 Polyneuropathy, unspecified: Secondary | ICD-10-CM

## 2021-02-10 DIAGNOSIS — G2581 Restless legs syndrome: Secondary | ICD-10-CM | POA: Diagnosis not present

## 2021-02-10 DIAGNOSIS — G40409 Other generalized epilepsy and epileptic syndromes, not intractable, without status epilepticus: Secondary | ICD-10-CM

## 2021-02-10 MED ORDER — GABAPENTIN 300 MG PO CAPS: ORAL_CAPSULE | ORAL | 5 refills | Status: DC

## 2021-02-10 NOTE — Patient Instructions (Signed)
Start gabapentin '300mg'$  at bedtime x 2 weeks, then increase to '600mg'$  at bedtime.   Return to clinic in 4 months

## 2021-02-10 NOTE — Progress Notes (Signed)
New Patient Visit   Date: 02/10/21    Nathan [name] MRN: [id number] DOB: 08-26-1945   Interim History: Nathan [name] is a 75 y.o. right-handed Caucasian last seen in 2019 returning for evaluation of RLS, seizure disorder (maintained on PHB), and neuropathy. The patient was accompanied to the clinic by wife who also provides collateral information.    History of present illness: Starting around 2014, patient developed numbness/tingling of the soles of his feet which was constant.  He would wear compression stockings for varicose veins and noticed the numbness when he would take them off in the evening and when he would shower.  Six months prior to symptom onset, he was getting varicose vein injections throughout his lower extremities, more on the right. His last treatment was in October 2014. In April 2015, his numbness suddenly worsened to involve his lower legs and knees, worse on the right.  Over the past few months, his leg numbness has improved some. He endorses mild problems with balance and has been doing home balance exercises.  He is on a very strict low-sugar, low-salt, vegetarian diet and reports that since taking vitamin B12 oral supplements, symptoms may have improved.    Patient was seen at [facility] by Dr. Leta Baptist in September 2015 for bilateral feet numbness.  EMG of the leg showed lumbar radiculopathy.  MRI lumbar spine which showed multilevel degenerative changes, without foraminal stenosis.  Patient sought the opinion of Dr. Vertell Limber for his lumbar radiculopathy who did not feel that symptoms were due to lumbar disease, and instead was concerning about neuropathy, and referred for second opinion.    Of note, he also has a history of seizure disorder which started at age 12 years old. 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  He was first placed on Dilantin, but due to ankle swelling he was switched to tegretol, but  this caused him to be sleepy. He was transitioned to phenobarbital 30 mg which controlled his seizures. He had a second seizure in 1997 but then was seizure free until February 2003 during a period when he was taken off phenobarbital for 3-4 months prior, due to long period of seizure freedom. After his last breakthrough seizure he has been on phenobarbital 60 mg at night.   He was found to have osteoporosis and has started algea-cal.    n March 2019, he and his wife moved into a retirement community and are transitioning to this.  He continues to tolerate PHB 76m daily.    UPDATE 02/10/2021:   He was last seen here in 2019.  Today, he presents because of worsening RLS.  When severe, he is walking up 3 hours per night to get relief.  In December, he was started on mirapex 0.1267m1-2 tablet, which he tried for about a month, but it did not help his symptoms, so he stopped it.  He recalls being sleepy during the daytime.  He has joined RLS foundation and has been learning more about it from their literature.   Neuropathy is unchanged and remains in the feet.   He has one seizure in June 2020 in the setting of taking bowel prep for colonoscopy and developed hyponatremia. He is compliant with phenobarbital.  Medications:  Current Outpatient Medications on File Prior to Visit  Medication Sig Dispense Refill   cholecalciferol (VITAMIN D) 1000 UNITS tablet Take 1,000 Units by mouth daily.  Cod Liver Oil OIL Take 5 mLs by mouth daily.     Coenzyme Q10 (CO Q 10) 60 MG CAPS Take 1 tablet by mouth daily.     cyanocobalamin 500 MCG tablet Take 1,000 mcg by mouth daily.     folic acid (FOLVITE) 1 MG tablet Take 1 mg by mouth daily.     Homeopathic Products (SIMILASAN DRY EYE RELIEF) SOLN Apply 1 drop to eye daily as needed. Dry Eyes     OVER THE COUNTER MEDICATION AlgeaCal     OVER THE COUNTER MEDICATION Strontium citrate     PHENObarbital (LUMINAL) 60 MG tablet TAKE ONE TABLET BY MOUTH AT BEDTIME 90  tablet 1   Probiotic Product (PROBIOTIC DAILY PO) Take by mouth daily.     vitamin C (ASCORBIC ACID) 500 MG tablet Take 500 mg by mouth 3 (three) times daily.     zinc gluconate 50 MG tablet Take 50 mg by mouth daily.     No current facility-administered medications on file prior to visit.    Allergies:  Allergies  Allergen Reactions   Suprep [Na Sulfate-K Sulfate-Mg Sulf] Nausea And Vomiting    Hyponatremia leading to seizure   Carbamazepine     REACTION: fatigue, sleepy   Ciprofloxacin     Per pt, "thinks caused rash"   Clindamycin/Lincomycin Diarrhea   Metronidazole     REACTION: GI upset   Penicillins     REACTION: childhood, hives?   Phenytoin     REACTION: rash, leg swelling   Sulfamethoxazole Other (See Comments)   Trimethoprim Other (See Comments)   Bactrim Other (See Comments)    Caused headaches, that progressively worsened when on medication. Once off, they went away.   Phenytoin Sodium Extended Swelling and Rash    Swelling of ankles. Happened years ago per patient.    Vital Signs:  BP 121/72   Pulse 68   Ht _0  (1.803 m)   Wt 147 lb (66.7 kg)   SpO2 99%   BMI 20.50 kg/m   Neurological Exam: MENTAL STATUS including orientation to time, place, person, recent and remote memory, attention span and concentration, language, and fund of knowledge is normal.  Speech is not dysarthric.  CRANIAL NERVES:  Pupils equal round and reactive to light.  Normal conjugate, extra-ocular eye movements in all directions of gaze.  Face is symmetric. Palate elevates symmetrically.  Tongue is midline.  MOTOR:  Motor strength is 5/5 in all extremities, except toe flexion and extension is 5-/5. No pronator drift.  Tone is normal.    MSRs:  Reflexes are 2+/4 throughout, except absent Achilles.  Negative Rhomberg.  SENSORY:  Mildly reduced vibration at the ankles. Sensation intact at the knees and in the hands.  COORDINATION/GAIT:  Intact rapid alternating movements  bilaterally.  Gait narrow based and stable, stooped posture, unasissted.   Data: EMG performed by Dr. Leta Baptist at [facility] 04/21/2014: Abnormal study demonstrating: 1. Evidence of bilateral lumbar radiculopathies. 2. No evidence of underlying widespread polyneuropathy.  MRI lumbar spine 05/11/2014: 1. Mild multilevel lumbar disc degeneration and facet arthrosis resulting in mild bilateral lateral recess stenosis at L3-4 and L4-5 and moderate left neural foraminal stenosis at L3-4. No spinal canal stenosis. 2. Diffuse bone marrow heterogeneity, nonspecific. Considerations include anemia, smoking, and infiltrative processes (including multiple myeloma).  Labs 09/15/2014:  Vitamin B6 15.5, vitamin B1 14, copper 80, zinc 62 Lab Results  Component Value Date   TSH 1.99 10/26/2020   Lab Results  Component Value  Date   VITAMINB12 1,050 (H) 01/07/2017   Lab Results  Component Value Date   FERRITIN 184 07/04/2020   Labs 11/25/2017:  PHB level 7.2*  IMPRESSION/PLAN: Restless leg syndrome, worsening.  Ferritin is normal  - No response with mirapex 0.125 - 0.17m (likely subtherapeutic)  - Start gabapentin 3063mat bedtime x 2 weeks, then 60037mt bedtime  - Consider sinemet going forward for breakthrough symptoms  Idiopathic peripheral neuropathy, stable  - Symptoms predominately manifesting with numbness in the feet  Generalized tonic-clonic seizures, last in 2020 in the setting of bowel prep for colonoscopy and hyponatremia, prior to this was 2003.  - Clinically doing well on phenobarbitol 74m41mily, which will be continued.   - PHB levels has always been low, but he remains seizure-free so no need for medications changes  Return to clinic in 4 months   Thank you for allowing me to participate in patient's care.  If I can answer any additional questions, I would be pleased to do so.    Sincerely,    Raahi Korber K. PatePosey Pronto

## 2021-02-21 ENCOUNTER — Other Ambulatory Visit: Payer: Self-pay

## 2021-02-21 ENCOUNTER — Ambulatory Visit: Payer: Medicare PPO | Admitting: Podiatry

## 2021-02-21 DIAGNOSIS — M79676 Pain in unspecified toe(s): Secondary | ICD-10-CM

## 2021-02-21 DIAGNOSIS — B351 Tinea unguium: Secondary | ICD-10-CM

## 2021-02-21 NOTE — Progress Notes (Signed)
He presents today chief complaint of painful elongated toenails bilaterally.  Objective: Vital signs are stable alert oriented x3 there is no erythema edema cellulitis drainage or odor.  Assessment: Pain is secondary onychomycosis.  Plan: Debridement toenails 1 through 5 bilateral.

## 2021-03-15 DIAGNOSIS — D225 Melanocytic nevi of trunk: Secondary | ICD-10-CM | POA: Diagnosis not present

## 2021-03-15 DIAGNOSIS — D2371 Other benign neoplasm of skin of right lower limb, including hip: Secondary | ICD-10-CM | POA: Diagnosis not present

## 2021-03-15 DIAGNOSIS — L821 Other seborrheic keratosis: Secondary | ICD-10-CM | POA: Diagnosis not present

## 2021-03-27 ENCOUNTER — Other Ambulatory Visit: Payer: Self-pay | Admitting: Family Medicine

## 2021-03-27 NOTE — Telephone Encounter (Signed)
Last filled 04/03/2021 Last OV 10/26/2020  Ok to fill?

## 2021-04-04 ENCOUNTER — Telehealth: Payer: Self-pay | Admitting: *Deleted

## 2021-04-04 NOTE — Telephone Encounter (Signed)
Patient declined AWV 

## 2021-05-02 ENCOUNTER — Other Ambulatory Visit: Payer: Self-pay

## 2021-05-02 ENCOUNTER — Ambulatory Visit: Payer: Medicare PPO | Admitting: Podiatry

## 2021-05-02 ENCOUNTER — Encounter: Payer: Self-pay | Admitting: Podiatry

## 2021-05-02 DIAGNOSIS — D2371 Other benign neoplasm of skin of right lower limb, including hip: Secondary | ICD-10-CM | POA: Diagnosis not present

## 2021-05-02 DIAGNOSIS — B351 Tinea unguium: Secondary | ICD-10-CM | POA: Diagnosis not present

## 2021-05-02 DIAGNOSIS — D2372 Other benign neoplasm of skin of left lower limb, including hip: Secondary | ICD-10-CM | POA: Diagnosis not present

## 2021-05-02 DIAGNOSIS — M79676 Pain in unspecified toe(s): Secondary | ICD-10-CM

## 2021-05-02 NOTE — Progress Notes (Signed)
He presents today chief complaint of painful elongated toenails and painful fourth digit left foot.  Objective: Vital signs stable he is alert oriented x3 hammertoe deformities bilateral pulses are palpable neurologic sensorium is intact he has a distal clavus fourth toe left foot also has painful elongated toenails.  Assessment: Pain limb secondary to onychomycosis and painful benign skin neoplasm.  Plan: Debrided benign skin neoplasm and debrided his toenails 1 through 5 bilateral

## 2021-05-16 DIAGNOSIS — H11043 Peripheral pterygium, stationary, bilateral: Secondary | ICD-10-CM | POA: Diagnosis not present

## 2021-05-16 DIAGNOSIS — H43813 Vitreous degeneration, bilateral: Secondary | ICD-10-CM | POA: Diagnosis not present

## 2021-05-16 DIAGNOSIS — H0102A Squamous blepharitis right eye, upper and lower eyelids: Secondary | ICD-10-CM | POA: Diagnosis not present

## 2021-05-16 DIAGNOSIS — H2513 Age-related nuclear cataract, bilateral: Secondary | ICD-10-CM | POA: Diagnosis not present

## 2021-05-16 DIAGNOSIS — H04123 Dry eye syndrome of bilateral lacrimal glands: Secondary | ICD-10-CM | POA: Diagnosis not present

## 2021-05-16 DIAGNOSIS — H0102B Squamous blepharitis left eye, upper and lower eyelids: Secondary | ICD-10-CM | POA: Diagnosis not present

## 2021-06-07 DIAGNOSIS — L820 Inflamed seborrheic keratosis: Secondary | ICD-10-CM | POA: Diagnosis not present

## 2021-06-07 DIAGNOSIS — L817 Pigmented purpuric dermatosis: Secondary | ICD-10-CM | POA: Diagnosis not present

## 2021-06-16 ENCOUNTER — Ambulatory Visit: Payer: Medicare PPO | Admitting: Neurology

## 2021-07-04 ENCOUNTER — Other Ambulatory Visit: Payer: Self-pay

## 2021-07-04 ENCOUNTER — Ambulatory Visit: Payer: Medicare PPO | Admitting: Podiatry

## 2021-07-04 ENCOUNTER — Encounter: Payer: Self-pay | Admitting: Podiatry

## 2021-07-04 DIAGNOSIS — M79676 Pain in unspecified toe(s): Secondary | ICD-10-CM | POA: Diagnosis not present

## 2021-07-04 DIAGNOSIS — B351 Tinea unguium: Secondary | ICD-10-CM

## 2021-07-04 DIAGNOSIS — D2372 Other benign neoplasm of skin of left lower limb, including hip: Secondary | ICD-10-CM | POA: Diagnosis not present

## 2021-07-04 DIAGNOSIS — D2371 Other benign neoplasm of skin of right lower limb, including hip: Secondary | ICD-10-CM

## 2021-07-04 NOTE — Progress Notes (Signed)
He presents today chief complaint of painful elongated toenails which are sharply encumber incurvated and painful with ambulation he also has complaining of painful calluses.  Objective: Vital signs are stable alert and oriented x3 pulses are palpable.  Toenails are sharp incurvated thickened discolored painful palpation as well as debridement.  He has benign skin lesions plantar aspect of the bilateral foot no open lesions or wounds.  Majority of these lesions are located beneath the metatarsal heads.  Assessment: Benign skin lesions bilateral and pain in limb secondary to onychomycosis.  Plan: Debridement of toenails 1 through 5 bilateral and debridement of benign skin lesions.  Follow-up with him in 2 months

## 2021-07-11 ENCOUNTER — Telehealth: Payer: Self-pay | Admitting: Family Medicine

## 2021-07-11 NOTE — Telephone Encounter (Signed)
Left message for patient to call back and schedule Medicare Annual Wellness Visit (AWV) either virtually or in office. Left  my Herbie Drape number [phone]   Last AWV ;02/03/20 please schedule at anytime with [facility] Nurse Health Advisor 1 or 2   This should be a 45 minute visit.

## 2021-09-05 ENCOUNTER — Ambulatory Visit (INDEPENDENT_AMBULATORY_CARE_PROVIDER_SITE_OTHER): Payer: Medicare PPO | Admitting: Podiatry

## 2021-09-05 ENCOUNTER — Encounter: Payer: Self-pay | Admitting: Podiatry

## 2021-09-05 ENCOUNTER — Other Ambulatory Visit: Payer: Self-pay

## 2021-09-05 DIAGNOSIS — D2371 Other benign neoplasm of skin of right lower limb, including hip: Secondary | ICD-10-CM

## 2021-09-05 DIAGNOSIS — B351 Tinea unguium: Secondary | ICD-10-CM

## 2021-09-05 DIAGNOSIS — M79676 Pain in unspecified toe(s): Secondary | ICD-10-CM | POA: Diagnosis not present

## 2021-09-05 DIAGNOSIS — D2372 Other benign neoplasm of skin of left lower limb, including hip: Secondary | ICD-10-CM | POA: Diagnosis not present

## 2021-09-05 NOTE — Progress Notes (Signed)
He presents today chief complaint of a painful lesion to the distal lateral aspect of his left fourth toe he is also complaining of painful elongated toenails.  Objective: Vital signs stable alert oriented x3.  Pulses are palpable.  Slight flexible arthritic DIPJ fourth digit left foot resulting in a benign hyperkeratotic skin lesion.  No open lesions or wounds are noted noted.  Toenails are long thick yellow dystrophic onychomycotic.  Assessment: Pain in limb secondary to benign skin lesion and elongated dystrophic mycotic nails.  Plan: Debridement of benign skin lesion debridement of nails 1 through 5 bilateral.

## 2021-09-07 ENCOUNTER — Ambulatory Visit (INDEPENDENT_AMBULATORY_CARE_PROVIDER_SITE_OTHER): Payer: Medicare PPO

## 2021-09-07 VITALS — Ht 71.0 in | Wt 147.0 lb

## 2021-09-07 DIAGNOSIS — Z0000 Encounter for general adult medical examination without abnormal findings: Secondary | ICD-10-CM

## 2021-09-07 NOTE — Patient Instructions (Addendum)
Mr. Nathan [name] , Thank you for taking time to come for your Medicare Wellness Visit. I appreciate your ongoing commitment to your health goals. Please review the following plan we discussed and let me know if I can assist you in the future.   These are the goals we discussed:  Goals       patient      To continue to eat well and exercise       Patient Stated (pt-stated)      I will continue to exercise for at least 3 days per  week.         This is a list of the screening recommended for you and due dates:  Health Maintenance  Topic Date Due   COVID-19 Vaccine (3 - Moderna risk series) 05/12/2021   Zoster (Shingles) Vaccine (1 of 2) 12/05/2021*   Colon Cancer Screening  02/04/2025   Tetanus Vaccine  01/08/2027   Pneumonia Vaccine  Completed   Flu Shot  Completed   Hepatitis C Screening: USPSTF Recommendation to screen - Ages 18-79 yo.  Completed   HPV Vaccine  Aged Out  *Topic was postponed. The date shown is not the original due date.    Advanced directives: Yes Patient will bring copy  Conditions/risks identified: None  Next appointment: Follow up in one year for your annual wellness visit.    Preventive Care 62 Years and Older, Male Preventive care refers to lifestyle choices and visits with your health care provider that can promote health and wellness. What does preventive care include? A yearly physical exam. This is also called an annual well check. Dental exams once or twice a year. Routine eye exams. Ask your health care provider how often you should have your eyes checked. Personal lifestyle choices, including: Daily care of your teeth and gums. Regular physical activity. Eating a healthy diet. Avoiding tobacco and drug use. Limiting alcohol use. Practicing safe sex. Taking low doses of aspirin every day. Taking vitamin and mineral supplements as recommended by your health care provider. What happens during an annual well check? The services and screenings  done by your health care provider during your annual well check will depend on your age, overall health, lifestyle risk factors, and family history of disease. Counseling  Your health care provider may ask you questions about your: Alcohol use. Tobacco use. Drug use. Emotional well-being. Home and relationship well-being. Sexual activity. Eating habits. History of falls. Memory and ability to understand (cognition). Work and work Statistician. Screening  You may have the following tests or measurements: Height, weight, and BMI. Blood pressure. Lipid and cholesterol levels. These may be checked every 5 years, or more frequently if you are over 75 years old. Skin check. Lung cancer screening. You may have this screening every year starting at age 34 if you have a 30-pack-year history of smoking and currently smoke or have quit within the past 15 years. Fecal occult blood test (FOBT) of the stool. You may have this test every year starting at age 41. Flexible sigmoidoscopy or colonoscopy. You may have a sigmoidoscopy every 5 years or a colonoscopy every 10 years starting at age 53. Prostate cancer screening. Recommendations will vary depending on your family history and other risks. Hepatitis C blood test. Hepatitis B blood test. Sexually transmitted disease (STD) testing. Diabetes screening. This is done by checking your blood sugar (glucose) after you have not eaten for a while (fasting). You may have this done every 1-3 years. Abdominal aortic aneurysm (  AAA) screening. You may need this if you are a current or former smoker. Osteoporosis. You may be screened starting at age 4 if you are at high risk. Talk with your health care provider about your test results, treatment options, and if necessary, the need for more tests. Vaccines  Your health care provider may recommend certain vaccines, such as: Influenza vaccine. This is recommended every year. Tetanus, diphtheria, and acellular  pertussis (Tdap, Td) vaccine. You may need a Td booster every 10 years. Zoster vaccine. You may need this after age 53. Pneumococcal 13-valent conjugate (PCV13) vaccine. One dose is recommended after age 56. Pneumococcal polysaccharide (PPSV23) vaccine. One dose is recommended after age 68. Talk to your health care provider about which screenings and vaccines you need and how often you need them. This information is not intended to replace advice given to you by your health care provider. Make sure you discuss any questions you have with your health care provider. Document Released: 07/29/2015 Document Revised: 03/21/2016 Document Reviewed: 05/03/2015 Elsevier Interactive Patient Education  2017 [place] Prevention in the Home Falls can cause injuries. They can happen to people of all ages. There are many things you can do to make your home safe and to help prevent falls. What can I do on the outside of my home? Regularly fix the edges of walkways and driveways and fix any cracks. Remove anything that might make you trip as you walk through a door, such as a raised step or threshold. Trim any bushes or trees on the path to your home. Use bright outdoor lighting. Clear any walking paths of anything that might make someone trip, such as rocks or tools. Regularly check to see if handrails are loose or broken. Make sure that both sides of any steps have handrails. Any raised decks and porches should have guardrails on the edges. Have any leaves, snow, or ice cleared regularly. Use sand or salt on walking paths during winter. Clean up any spills in your garage right away. This includes oil or grease spills. What can I do in the bathroom? Use night lights. Install grab bars by the toilet and in the tub and shower. Do not use towel bars as grab bars. Use non-skid mats or decals in the tub or shower. If you need to sit down in the shower, use a plastic, non-slip stool. Keep the floor  dry. Clean up any water that spills on the floor as soon as it happens. Remove soap buildup in the tub or shower regularly. Attach bath mats securely with double-sided non-slip rug tape. Do not have throw rugs and other things on the floor that can make you trip. What can I do in the bedroom? Use night lights. Make sure that you have a light by your bed that is easy to reach. Do not use any sheets or blankets that are too big for your bed. They should not hang down onto the floor. Have a firm chair that has side arms. You can use this for support while you get dressed. Do not have throw rugs and other things on the floor that can make you trip. What can I do in the kitchen? Clean up any spills right away. Avoid walking on wet floors. Keep items that you use a lot in easy-to-reach places. If you need to reach something above you, use a strong step stool that has a grab bar. Keep electrical cords out of the way. Do not use floor polish  or wax that makes floors slippery. If you must use wax, use non-skid floor wax. Do not have throw rugs and other things on the floor that can make you trip. What can I do with my stairs? Do not leave any items on the stairs. Make sure that there are handrails on both sides of the stairs and use them. Fix handrails that are broken or loose. Make sure that handrails are as long as the stairways. Check any carpeting to make sure that it is firmly attached to the stairs. Fix any carpet that is loose or worn. Avoid having throw rugs at the top or bottom of the stairs. If you do have throw rugs, attach them to the floor with carpet tape. Make sure that you have a light switch at the top of the stairs and the bottom of the stairs. If you do not have them, ask someone to add them for you. What else can I do to help prevent falls? Wear shoes that: Do not have high heels. Have rubber bottoms. Are comfortable and fit you well. Are closed at the toe. Do not wear  sandals. If you use a stepladder: Make sure that it is fully opened. Do not climb a closed stepladder. Make sure that both sides of the stepladder are locked into place. Ask someone to hold it for you, if possible. Clearly mark and make sure that you can see: Any grab bars or handrails. First and last steps. Where the edge of each step is. Use tools that help you move around (mobility aids) if they are needed. These include: Canes. Walkers. Scooters. Crutches. Turn on the lights when you go into a dark area. Replace any light bulbs as soon as they burn out. Set up your furniture so you have a clear path. Avoid moving your furniture around. If any of your floors are uneven, fix them. If there are any pets around you, be aware of where they are. Review your medicines with your doctor. Some medicines can make you feel dizzy. This can increase your chance of falling. Ask your doctor what other things that you can do to help prevent falls. This information is not intended to replace advice given to you by your health care provider. Make sure you discuss any questions you have with your health care provider. Document Released: 04/28/2009 Document Revised: 12/08/2015 Document Reviewed: 08/06/2014 Elsevier Interactive Patient Education  2017 [employer].

## 2021-09-07 NOTE — Progress Notes (Signed)
Subjective:   TARRY FOUNTAIN is a 76 y.o. male who presents for Medicare Annual/Subsequent preventive examination.  Review of Systems    Virtual Visit via Telephone Note  I connected with  LATWAN LUCHSINGER on 09/07/21 at  9:45 AM EST by telephone and verified that I am speaking with the correct person using two identifiers.  Location: Patient: Home Provider: Office Persons participating in the virtual visit: patient/Nurse Health Advisor   I discussed the limitations, risks, security and privacy concerns of performing an evaluation and management service by telephone and the availability of in person appointments. The patient expressed understanding and agreed to proceed.  Interactive audio and video telecommunications were attempted between this nurse and patient, however failed, due to patient having technical difficulties OR patient did not have access to video capability.  We continued and completed visit with audio only.  Some vital signs may be absent or patient reported.   Tillie Rung, LPN  Cardiac Risk Factors include: advanced age (>44men, >35 women);male gender     Objective:    Today's Vitals   09/07/21 0947  Weight: 147 lb (66.7 kg)  Height: 5\' 11"  (1.803 m)   Body mass index is 20.5 kg/m.  Advanced Directives 09/07/2021 02/10/2021 07/09/2020 02/03/2020 12/25/2018 11/23/2016 11/23/2014  Does Patient Have a Medical Advance Directive? Yes Yes Yes Yes No Yes No  Type of 01/23/2015 of [place];Living will Healthcare Power of [place];Out of facility DNR (pink MOST or yellow form);Living will Healthcare Power of [place];Living will Living will;Healthcare Power of Attorney - - -  Does patient want to make changes to medical advance directive? No - Patient declined - - No - Patient declined - - -  Copy of Healthcare Power of Attorney in Chart? No - copy requested - - No - copy requested - - -  Would patient like information on creating a medical  advance directive? - - - - No - Patient declined - No - patient declined information    Current Medications (verified) Outpatient Encounter Medications as of 09/07/2021  Medication Sig   cholecalciferol (VITAMIN D) 1000 UNITS tablet Take 1,000 Units by mouth daily.   Cod Liver Oil OIL Take 5 mLs by mouth daily.   Coenzyme Q10 (CO Q 10) 60 MG CAPS Take 1 tablet by mouth daily.   cyanocobalamin 500 MCG tablet Take 1,000 mcg by mouth daily.   folic acid (FOLVITE) 1 MG tablet Take 1 mg by mouth daily.   gabapentin (NEURONTIN) 300 MG capsule Take 1 tablet at bedtime x 2 weeks, then increase to 2 tablet at bedtime   Homeopathic Products (SIMILASAN DRY EYE RELIEF) SOLN Apply 1 drop to eye daily as needed. Dry Eyes   OVER THE COUNTER MEDICATION AlgeaCal   OVER THE COUNTER MEDICATION Strontium citrate   PHENObarbital (LUMINAL) 60 MG tablet TAKE ONE TABLET BY MOUTH AT BEDTIME   Probiotic Product (PROBIOTIC DAILY PO) Take by mouth daily.   vitamin C (ASCORBIC ACID) 500 MG tablet Take 500 mg by mouth 3 (three) times daily.   zinc gluconate 50 MG tablet Take 50 mg by mouth daily.   No facility-administered encounter medications on file as of 09/07/2021.    Allergies (verified) Suprep [na sulfate-k sulfate-mg sulf], Carbamazepine, Ciprofloxacin, Clindamycin/lincomycin, Metronidazole, Penicillins, Phenytoin, Sulfamethoxazole, Trimethoprim, Bactrim, and Phenytoin sodium extended   History: Past Medical History:  Diagnosis Date   Colon polyps    Sessile Serrated adenomas   DYSPNEA 02/09/2009   RESTLESS LEGS  SYNDROME 02/28/2009   SEIZURE DISORDER 02/09/2009   last seisure Sep 10, 2001   Past Surgical History:  Procedure Laterality Date   COLONOSCOPY     HERNIA REPAIR Right 02/11/09   dr. Donella Stade -[facility]   HERNIA REPAIR Left    LAPAROSCOPIC APPENDECTOMY  06/01/2011   Procedure: APPENDECTOMY LAPAROSCOPIC;  Surgeon: Shann Medal, MD;  Location: [facility];  Service: General;  Laterality: N/A;    POLYPECTOMY     Family History  Problem Relation Age of Onset   Cancer Mother        unknown type but thinks "male organs"   Cancer Father        multiple myeloma   Diabetes Mellitus II Father    Stroke Neg Hx        grandparent   Colon cancer Neg Hx    Esophageal cancer Neg Hx    Rectal cancer Neg Hx    Stomach cancer Neg Hx    Pancreatic cancer Neg Hx    Liver disease Neg Hx    Social History   Socioeconomic History   Marital status: Married    Spouse name: Thayer Headings   Number of children: 0   Years of education: 2 MA's    Highest education level: Not on file  Occupational History   Occupation: Retired Product manager: OTHER    Comment: Part Time: [employer]  Tobacco Use   Smoking status: Never   Smokeless tobacco: Never  Vaping Use   Vaping Use: Never used  Substance and Sexual Activity   Alcohol use: No    Alcohol/week: 0.0 standard drinks   Drug use: No   Sexual activity: Not on file  Other Topics Concern   Not on file  Social History Narrative   Patient lives at home with spouse.   Caffeine Use: quit 1980   Lives in a one story home. No chidren.   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)   Social Determinants of Health   Financial Resource Strain: Low Risk    Difficulty of Paying Living Expenses: Not hard at all  Food Insecurity: No Food Insecurity   Worried About Charity fundraiser in the Last Year: Never true   [place] in the Last Year: Never true  Transportation Needs: No Transportation Needs   Lack of Transportation (Medical): No   Lack of Transportation (Non-Medical): No  Physical Activity: Sufficiently Active   Days of Exercise per Week: 5 days   Minutes of Exercise per Session: 60 min  Stress: No Stress Concern Present   Feeling of Stress : Not at all  Social Connections: Socially Integrated   Frequency of Communication with Friends and Family: Three times a week   Frequency of Social  Gatherings with Friends and Family: Three times a week   Attends Religious Services: More than 4 times per year   Active Member of Clubs or Organizations: Yes   Attends Archivist Meetings: 1 to 4 times per year   Marital Status: Married     Clinical Intake:  Pre-visit preparation completed: Yes Diabetic?  No  Activities of Daily Living In your present state of health, do you have any difficulty performing the following activities: 09/07/2021  Hearing? N  Vision? N  Difficulty concentrating or making decisions? N  Walking or climbing stairs? N  Dressing or bathing? N  Doing errands, shopping? N  Preparing Food and eating ?  N  Using the Toilet? N  In the past six months, have you accidently leaked urine? N  Do you have problems with loss of bowel control? N  Managing your Medications? N  Managing your Finances? N  Housekeeping or managing your Housekeeping? N  Some recent data might be hidden    Patient Care Team: [name], MD as PCP - [place], Gary, DO as Consulting Physician (Neurology)  Indicate any recent Medical Services you may have received from other than [facility] providers in the past year (date may be approximate).     Assessment:   This is a routine wellness examination for Caio.  Hearing/Vision screen Hearing Screening - Comments:: Wears hearing aids Vision Screening - Comments:: Wears glasses. Followed by Dr Katy Fitch  Dietary issues and exercise activities discussed: Exercise limited by: None identified   Goals Addressed               This Visit's Progress     Patient Stated (pt-stated)        I will continue to exercise for at least 3 days per  week.        Depression Screen PHQ 2/9 Scores 09/07/2021 02/03/2020 11/03/2018 11/25/2017 11/23/2016 08/19/2014 01/22/2013  PHQ - 2 Score 0 0 0 0 0 0 0  PHQ- 9 Score - 0 - - - - -    Fall Risk Fall Risk  09/07/2021 02/10/2021 02/03/2020 01/08/2018 11/25/2017  Falls in the past year? 0  0 1 No No  Number falls in past yr: 0 0 0 - -  Injury with Fall? 0 0 1 - -  Comment - - Yes broke great toe on left foot - -  Risk for fall due to : No Fall Risks - Impaired balance/gait - -  Follow up - - Falls evaluation completed;Falls prevention discussed - -    FALL RISK PREVENTION PERTAINING TO THE HOME:  Any stairs in or around the home? No  If so, are there any without handrails? No  Home free of loose throw rugs in walkways, pet beds, electrical cords, etc? Yes  Adequate lighting in your home to reduce risk of falls? Yes   ASSISTIVE DEVICES UTILIZED TO PREVENT FALLS:  Life alert? No  Use of a cane, walker or w/c? No  Grab bars in the bathroom? Yes  Shower chair or bench in shower? Yes  Elevated toilet seat or a handicapped toilet? Yes   TIMED UP AND GO:  Was the test performed? No . Audio Visit  Cognitive Function: MMSE - Mini Mental State Exam 11/23/2016  Not completed: (No Data)     6CIT Screen 09/07/2021 02/03/2020  What Year? 0 points 0 points  What month? 0 points 0 points  What time? 0 points 0 points  Count back from 20 0 points 0 points  Months in reverse 0 points 0 points  Repeat phrase 0 points 0 points  Total Score 0 0    Immunizations Immunization History  Administered Date(s) Administered   Fluad Quad(high Dose 65+) 04/20/2019   Influenza Split 04/26/2011, 04/30/2012   Influenza Whole 05/05/2010   Influenza, High Dose Seasonal PF 05/12/2015, 04/13/2016, 05/02/2017, 04/29/2018   Influenza,inj,Quad PF,6+ Mos 05/07/2013, 05/05/2014   Influenza-Unspecified 04/29/2020, 04/07/2021   MODERNA COVID-19 SARS-COV-2 PEDS BIVALENT BOOSTER 6Y-11Y 05/12/2021   Moderna Sars-Covid-2 Vaccination 08/20/2019, 09/17/2019   Pneumococcal Conjugate-13 01/18/2015   Pneumococcal Polysaccharide-23 01/22/2013   Td 01/28/2007   Tdap 01/07/2017   Zoster, Live 02/05/2011  TDAP status: Up to date  Flu Vaccine status: Up to date  Pneumococcal vaccine status: Up to  date  Covid-19 vaccine status: Completed vaccines  Qualifies for Shingles Vaccine? Yes   Zostavax completed No   Shingrix Completed?: No.    Education has been provided regarding the importance of this vaccine. Patient has been advised to call insurance company to determine out of pocket expense if they have not yet received this vaccine. Advised may also receive vaccine at local pharmacy or Health Dept. Verbalized acceptance and understanding.  Screening Tests Health Maintenance  Topic Date Due   COVID-19 Vaccine (3 - Moderna risk series) 05/12/2021   Zoster Vaccines- Shingrix (1 of 2) 12/05/2021 (Originally 02/07/1965)   COLONOSCOPY (Pts 45-79yrs Insurance coverage will need to be confirmed)  02/04/2025   TETANUS/TDAP  01/08/2027   Pneumonia Vaccine 22+ Years old  Completed   INFLUENZA VACCINE  Completed   Hepatitis C Screening  Completed   HPV VACCINES  Aged Out    Health Maintenance  Health Maintenance Due  Topic Date Due   COVID-19 Vaccine (3 - Moderna risk series) 05/12/2021    Colorectal cancer screening: Type of screening: Colonoscopy. Completed 02/05/20. Repeat every 5 years  Lung Cancer Screening: (Low Dose CT Chest recommended if Age 40-80 years, 30 pack-year currently smoking OR have quit w/in 15years.) does not qualify.     Additional Screening:  Hepatitis C Screening: does qualify; Completed 11/23/16  Vision Screening: Recommended annual ophthalmology exams for early detection of glaucoma and other disorders of the eye. Is the patient up to date with their annual eye exam?  Yes  Who is the provider or what is the name of the office in which the patient attends annual eye exams? Dr Katy Fitch If pt is not established with a provider, would they like to be referred to a provider to establish care? No .   Dental Screening: Recommended annual dental exams for proper oral hygiene  Community Resource Referral / Chronic Care Management:  CRR required this visit?  No    CCM required this visit?  No      Plan:     I have personally reviewed and noted the following in the patients chart:   Medical and social history Use of alcohol, tobacco or illicit drugs  Current medications and supplements including opioid prescriptions. Patient is not currently taking opioid prescriptions. Functional ability and status Nutritional status Physical activity Advanced directives List of other physicians Hospitalizations, surgeries, and ER visits in previous 12 months Vitals Screenings to include cognitive, depression, and falls Referrals and appointments  In addition, I have reviewed and discussed with patient certain preventive protocols, quality metrics, and best practice recommendations. A written personalized care plan for preventive services as well as general preventive health recommendations were provided to patient.     Criselda Peaches, LPN   [id number]   Nurse Notes: None

## 2021-09-22 ENCOUNTER — Other Ambulatory Visit: Payer: Self-pay | Admitting: Family Medicine

## 2021-09-24 NOTE — Telephone Encounter (Signed)
Refilled once.   Will need labs soon.   ?

## 2021-10-31 ENCOUNTER — Encounter: Payer: Self-pay | Admitting: Family Medicine

## 2021-10-31 ENCOUNTER — Ambulatory Visit (INDEPENDENT_AMBULATORY_CARE_PROVIDER_SITE_OTHER): Payer: Medicare PPO | Admitting: Family Medicine

## 2021-10-31 VITALS — BP 126/70 | HR 65 | Temp 97.8°F | Ht 71.0 in | Wt 146.6 lb

## 2021-10-31 DIAGNOSIS — Z0000 Encounter for general adult medical examination without abnormal findings: Secondary | ICD-10-CM | POA: Diagnosis not present

## 2021-10-31 DIAGNOSIS — M818 Other osteoporosis without current pathological fracture: Secondary | ICD-10-CM

## 2021-10-31 DIAGNOSIS — R569 Unspecified convulsions: Secondary | ICD-10-CM | POA: Diagnosis not present

## 2021-10-31 LAB — BASIC METABOLIC PANEL
BUN: 13 mg/dL (ref 6–23)
CO2: 32 mEq/L (ref 19–32)
Calcium: 9.6 mg/dL (ref 8.4–10.5)
Chloride: 99 mEq/L (ref 96–112)
Creatinine, Ser: 0.66 mg/dL (ref 0.40–1.50)
GFR: 92.25 mL/min (ref >60.00)
Glucose, Bld: 90 mg/dL (ref 70–99)
Potassium: 4.5 mEq/L (ref 3.5–5.1)
Sodium: 141 mEq/L (ref 135–145)

## 2021-10-31 LAB — CBC WITH DIFFERENTIAL/PLATELET
Basophils Absolute: 0.1 10*3/uL (ref 0.0–0.1)
Basophils Relative: 2.4 % (ref 0.0–3.0)
Eosinophils Absolute: 0.1 10*3/uL (ref 0.0–0.7)
Eosinophils Relative: 2.9 % (ref 0.0–5.0)
HCT: 43 % (ref 39.0–52.0)
Hemoglobin: 14.5 g/dL (ref 13.0–17.0)
Lymphocytes Relative: 35.2 % (ref 12.0–46.0)
Lymphs Abs: 1.2 10*3/uL (ref 0.7–4.0)
MCHC: 33.7 g/dL (ref 30.0–36.0)
MCV: 94.8 fl (ref 78.0–100.0)
Monocytes Absolute: 0.6 10*3/uL (ref 0.1–1.0)
Monocytes Relative: 16.4 % — ABNORMAL HIGH (ref 3.0–12.0)
Neutro Abs: 1.5 10*3/uL (ref 1.4–7.7)
Neutrophils Relative %: 43.1 % (ref 43.0–77.0)
Platelets: 161 10*3/uL (ref 150.0–400.0)
RBC: 4.54 Mil/uL (ref 4.22–5.81)
RDW: 13.6 % (ref 11.5–15.5)
WBC: 3.4 10*3/uL — ABNORMAL LOW (ref 4.0–10.5)

## 2021-10-31 LAB — LIPID PANEL
Cholesterol: 191 mg/dL (ref 0–200)
HDL: 107.1 mg/dL (ref >39.00)
LDL Cholesterol: 74 mg/dL (ref 0–99)
NonHDL: 83.52
Total CHOL/HDL Ratio: 2
Triglycerides: 46 mg/dL (ref 0.0–149.0)
VLDL: 9.2 mg/dL (ref 0.0–40.0)

## 2021-10-31 LAB — HEPATIC FUNCTION PANEL
ALT: 20 U/L (ref 0–53)
AST: 19 U/L (ref 0–37)
Albumin: 4.7 g/dL (ref 3.5–5.2)
Alkaline Phosphatase: 104 U/L (ref 39–117)
Bilirubin, Direct: 0.1 mg/dL (ref 0.0–0.3)
Total Bilirubin: 0.7 mg/dL (ref 0.2–1.2)
Total Protein: 6.6 g/dL (ref 6.0–8.3)

## 2021-10-31 LAB — VITAMIN D 25 HYDROXY (VIT D DEFICIENCY, FRACTURES): VITD: 37.54 ng/mL (ref 30.00–100.00)

## 2021-10-31 LAB — PSA: PSA: 0.93 ng/mL (ref 0.10–4.00)

## 2021-10-31 LAB — TSH: TSH: 1.8 u[IU]/mL (ref 0.35–5.50)

## 2021-10-31 NOTE — Progress Notes (Signed)
?Subjective:  ?  ? Patient ID: Nathan [name], male   DOB: Oct 07, 1945, 76 y.o.   MRN: [id number] ? ?HPI ? ?Here for complete physical.  Generally very healthy.  He does have remote history of seizures but not in over 20 years.  Remains on phenobarbital.  He does have history of osteoporosis.  Has seen a couple of improvements in DEXA scan since starting algae Cal supplementation along with strontium supplement.  He declined medication such as bisphosphonates. ? ?Generally doing well.  No recent falls.  He does exercise fairly regularly.  Takes several supplements but only prescription medication is phenobarbital. ? ?Family history-mother had either ovarian or uterine cancer and died from that.  Father had multiple myeloma and died age 14.  No siblings.  No family history of premature CAD. ? ?Social history-married for 46 years.  No children.  Never smoked.  No alcohol.  Retired from [employer].  Exercises with exercise bike and does some free weights several times per week ? ?Health maintenance reviewed up-to-date with exception of no Shingrix vaccine.  Has had prior Zostavax.  Declined Shingrix. ? ?Past Medical History:  ?Diagnosis Date  ? Colon polyps   ? Sessile Serrated adenomas  ? DYSPNEA 02/09/2009  ? RESTLESS LEGS SYNDROME 02/28/2009  ? SEIZURE DISORDER 02/09/2009  ? last seisure Sep 10, 2001  ? ?Past Surgical History:  ?Procedure Laterality Date  ? COLONOSCOPY    ? HERNIA REPAIR Right 02/11/09  ? dr. Donella Stade -[facility]  ? HERNIA REPAIR Left   ? LAPAROSCOPIC APPENDECTOMY  06/01/2011  ? Procedure: APPENDECTOMY LAPAROSCOPIC;  Surgeon: Shann Medal, MD;  Location: [facility];  Service: General;  Laterality: N/A;  ? POLYPECTOMY    ? ? reports that he has never smoked. He has never used smokeless tobacco. He reports that he does not drink alcohol and does not use drugs. ?family history includes Cancer in his father and mother; Diabetes Mellitus II in his father. ?Allergies  ?Allergen Reactions  ? Suprep [Na Sulfate-K Sulfate-Mg  Sulf] Nausea And Vomiting  ?  Hyponatremia leading to seizure  ? Carbamazepine   ?  REACTION: fatigue, sleepy  ? Ciprofloxacin   ?  Per pt, "thinks caused rash"  ? Clindamycin/Lincomycin Diarrhea  ? Metronidazole   ?  REACTION: GI upset  ? Penicillins   ?  REACTION: childhood, hives?  ? Phenytoin   ?  REACTION: rash, leg swelling  ? Sulfamethoxazole Other (See Comments)  ? Trimethoprim Other (See Comments)  ? Bactrim Other (See Comments)  ?  Caused headaches, that progressively worsened when on medication. Once off, they went away.  ? Phenytoin Sodium Extended Swelling and Rash  ?  Swelling of ankles. Happened years ago per patient.  ? ? ?Wt Readings from Last 3 Encounters:  ?10/31/21 146 lb 9.6 oz (66.5 kg)  ?09/07/21 147 lb (66.7 kg)  ?02/10/21 147 lb (66.7 kg)  ? ?- ? ?Review of Systems  ?Constitutional:  Negative for activity change, appetite change, fatigue and fever.  ?HENT:  Negative for congestion, ear pain and trouble swallowing.   ?Eyes:  Negative for pain and visual disturbance.  ?Respiratory:  Negative for cough, shortness of breath and wheezing.   ?Cardiovascular:  Negative for chest pain and palpitations.  ?Gastrointestinal:  Negative for abdominal distention, abdominal pain, blood in stool, constipation, diarrhea, nausea, rectal pain and vomiting.  ?Endocrine: Negative for polydipsia and polyuria.  ?Genitourinary:  Negative for dysuria, hematuria and testicular pain.  ?Musculoskeletal:  Negative for arthralgias  and joint swelling.  ?Skin:  Negative for rash.  ?Neurological:  Negative for dizziness, syncope and headaches.  ?Hematological:  Negative for adenopathy.  ?Psychiatric/Behavioral:  Negative for confusion and dysphoric mood.   ? ?   ?Objective:  ? Physical Exam ?Vitals reviewed.  ?Cardiovascular:  ?   Rate and Rhythm: Normal rate and regular rhythm.  ?Pulmonary:  ?   Effort: Pulmonary effort is normal.  ?   Breath sounds: Normal breath sounds.  ?Abdominal:  ?   Palpations: Abdomen is soft.  ?    Tenderness: There is no abdominal tenderness. There is no guarding or rebound.  ?Genitourinary: ?   Prostate: Normal.  ?Musculoskeletal:  ?   Right lower leg: No edema.  ?   Left lower leg: No edema.  ?Neurological:  ?   General: No focal deficit present.  ?   Mental Status: He is alert.  ?   Cranial Nerves: No cranial nerve deficit.  ?Psychiatric:     ?   Mood and Affect: Mood normal.  ? ? ?   ?Assessment:  ?   ?76 year old male with remote history of seizures but not over 20 years.  He does have history of osteoporosis and has seen some gradual improvement in his numbers with supplementation with algae Cal. ?   ?Plan:  ?   ?-Obtain screening labs ?-The natural history of prostate cancer and ongoing controversy regarding screening and potential treatment outcomes of prostate cancer has been discussed with the patient. The meaning of a false positive PSA and a false negative PSA has been discussed. He indicates understanding of the limitations of this screening test and wishes  to proceed with screening PSA testing. ?-Set up repeat DEXA scan ?-Continue regular exercise habits ?-Continue annual flu vaccine ?-Check 25-hydroxy vitamin D level ? ?[name] MD ?[facility] ? ? ?   ?

## 2021-11-02 LAB — PHENOBARBITAL LEVEL: Phenobarbital, Serum: 8.3 mg/L — ABNORMAL LOW (ref 15.0–40.0)

## 2021-11-06 ENCOUNTER — Encounter: Payer: Self-pay | Admitting: Family Medicine

## 2021-11-07 ENCOUNTER — Ambulatory Visit (INDEPENDENT_AMBULATORY_CARE_PROVIDER_SITE_OTHER): Payer: Medicare PPO | Admitting: Podiatry

## 2021-11-07 DIAGNOSIS — B351 Tinea unguium: Secondary | ICD-10-CM

## 2021-11-07 DIAGNOSIS — M79676 Pain in unspecified toe(s): Secondary | ICD-10-CM | POA: Diagnosis not present

## 2021-11-07 DIAGNOSIS — D2372 Other benign neoplasm of skin of left lower limb, including hip: Secondary | ICD-10-CM

## 2021-11-07 NOTE — Progress Notes (Signed)
He presents today chief complaint of painful elongated toenails and calluses bilateral. ? ?Objective: Toenails are long thick yellow dystrophic-like mycotic multiple calluses bilateral.  Pulses are palpable.  No open lesions or wounds. ? ?Assessment: Pain in limb secondary to onychomycosis and benign skin lesions. ? ?Plan: Debridement of benign skin lesion debridement of painful mycotic nails. ?

## 2021-11-10 ENCOUNTER — Encounter: Payer: Self-pay | Admitting: Family Medicine

## 2021-11-16 ENCOUNTER — Encounter (HOSPITAL_BASED_OUTPATIENT_CLINIC_OR_DEPARTMENT_OTHER): Payer: Self-pay | Admitting: Emergency Medicine

## 2021-11-16 ENCOUNTER — Other Ambulatory Visit: Payer: Self-pay

## 2021-11-16 ENCOUNTER — Emergency Department (HOSPITAL_BASED_OUTPATIENT_CLINIC_OR_DEPARTMENT_OTHER)
Admission: EM | Admit: 2021-11-16 | Discharge: 2021-11-16 | Disposition: A | Discharge status: Home / Self Care | Payer: Medicare PPO | Attending: Emergency Medicine | Admitting: Emergency Medicine

## 2021-11-16 ENCOUNTER — Emergency Department (HOSPITAL_BASED_OUTPATIENT_CLINIC_OR_DEPARTMENT_OTHER): Payer: Medicare PPO | Admitting: Radiology

## 2021-11-16 DIAGNOSIS — S62609A Fracture of unspecified phalanx of unspecified finger, initial encounter for closed fracture: Secondary | ICD-10-CM

## 2021-11-16 DIAGNOSIS — S80212A Abrasion, left knee, initial encounter: Secondary | ICD-10-CM | POA: Insufficient documentation

## 2021-11-16 DIAGNOSIS — S60511A Abrasion of right hand, initial encounter: Secondary | ICD-10-CM | POA: Insufficient documentation

## 2021-11-16 DIAGNOSIS — Y92194 Driveway of other specified residential institution as the place of occurrence of the external cause: Secondary | ICD-10-CM | POA: Diagnosis not present

## 2021-11-16 DIAGNOSIS — W010XXA Fall on same level from slipping, tripping and stumbling without subsequent striking against object, initial encounter: Secondary | ICD-10-CM | POA: Insufficient documentation

## 2021-11-16 DIAGNOSIS — Y9301 Activity, walking, marching and hiking: Secondary | ICD-10-CM | POA: Insufficient documentation

## 2021-11-16 DIAGNOSIS — W19XXXA Unspecified fall, initial encounter: Secondary | ICD-10-CM

## 2021-11-16 DIAGNOSIS — S6992XA Unspecified injury of left wrist, hand and finger(s), initial encounter: Secondary | ICD-10-CM | POA: Diagnosis present

## 2021-11-16 DIAGNOSIS — S60512A Abrasion of left hand, initial encounter: Secondary | ICD-10-CM | POA: Diagnosis not present

## 2021-11-16 DIAGNOSIS — S62617A Displaced fracture of proximal phalanx of left little finger, initial encounter for closed fracture: Secondary | ICD-10-CM | POA: Diagnosis not present

## 2021-11-16 DIAGNOSIS — T07XXXA Unspecified multiple injuries, initial encounter: Secondary | ICD-10-CM

## 2021-11-16 DIAGNOSIS — M25561 Pain in right knee: Secondary | ICD-10-CM | POA: Diagnosis not present

## 2021-11-16 DIAGNOSIS — S62317A Displaced fracture of base of fifth metacarpal bone. left hand, initial encounter for closed fracture: Secondary | ICD-10-CM | POA: Diagnosis not present

## 2021-11-16 MED ORDER — LIDOCAINE HCL (PF) 1 % IJ SOLN
30.0000 mL | Freq: Once | INTRAMUSCULAR | Status: AC
  Administered 2021-11-16: 30 mL
  Filled 2021-11-16: qty 30

## 2021-11-16 MED ORDER — ACETAMINOPHEN 325 MG PO TABS: 650.0000 mg | ORAL_TABLET | Freq: Once | ORAL | Status: DC

## 2021-11-16 NOTE — ED Notes (Signed)
Right palm abrasions and left knee abrasion cleansed with saline and dry dressing applied. Left hand has small round abrasions to every finger, cleansed ,will await final decision on dressing after xray.  ?

## 2021-11-16 NOTE — ED Triage Notes (Signed)
Pt arrives to ED with c/o fall. Pt reports he was gardening in the front yard today and was walking after a ball of twine in his driveway when he stumbled and fell. He denies LOC or syncope. He landed on concrete and injured his left pinky and scarped his left knee. He denies injury to head or neck.  ?

## 2021-11-16 NOTE — Discharge Instructions (Addendum)
Please keep splint in place ?Elevate hand and use cold therapy as needed ?Please be at Dr. Lequita Asal office at noon tomorrow. ?

## 2021-11-16 NOTE — ED Provider Notes (Signed)
?[facility] ?Provider Note ? ? ?CSN: [id number] ?Arrival date & time: 11/16/21  1456 ? ?  ? ?History ? ?Chief Complaint  ?Patient presents with  ? Fall  ? ? ?Nathan [name] is a 76 y.o. male. ? ?HPI ?76 yo male ho sz disorder presents today after mechanical fall in drive way. No loc.  Patient fell to hands and knees.  He was able to get up on his own and walked into his house.  Wife brought him by private vehicle to ED.  Patient with deformity to base left 5th digit.  Abrasions to palm, and left knee.  Some pain right knee.  Did not strike his head and no back or neck pain orinjury. ? ? ? ?  ? ?Home Medications ?Prior to Admission medications   ?Medication Sig Start Date End Date Taking? Authorizing Provider  ?cholecalciferol (VITAMIN D) 1000 UNITS tablet Take 1,000 Units by mouth daily.    [provider]  ?Cod Liver Oil OIL Take 5 mLs by mouth daily.    [provider]  ?Coenzyme Q10 (CO Q 10) 60 MG CAPS Take 1 tablet by mouth daily.    [provider]  ?cyanocobalamin 500 MCG tablet Take 1,000 mcg by mouth daily.    [provider]  ?folic acid (FOLVITE) 1 MG tablet Take 1 mg by mouth daily.    [provider]  ?Homeopathic Products [facility] DRY EYE RELIEF) SOLN Apply 1 drop to eye daily as needed. Dry Eyes    [provider]  ?Cameron    [provider]  ?OVER THE COUNTER MEDICATION Strontium citrate    [provider]  ?PHENObarbital (LUMINAL) 60 MG tablet TAKE ONE TABLET BY MOUTH AT BEDTIME 09/24/21   Burchette, Alinda Sierras, MD  ?Probiotic Product (PROBIOTIC DAILY PO) Take by mouth daily.    [provider]  ?vitamin C (ASCORBIC ACID) 500 MG tablet Take 500 mg by mouth 3 (three) times daily.    [provider]  ?zinc gluconate 50 MG tablet Take 50 mg by mouth daily.    [provider]  ?   ? ?Allergies    ?Suprep [na sulfate-k sulfate-mg sulf], Carbamazepine,  Ciprofloxacin, Clindamycin/lincomycin, Metronidazole, Penicillins, Phenytoin, Sulfamethoxazole, Trimethoprim, Bactrim, and Phenytoin sodium extended   ? ?Review of Systems   ?Review of Systems  ?All other systems reviewed and are negative. ? ?Physical Exam ?Updated Vital Signs ?BP (!) 144/78 (BP Location: Right Arm)   Pulse 68   Temp 98.2 ?F (36.8 ?C)   Resp 18   Ht 1.803 m ('5\' 11"'$ )   Wt 68 kg   SpO2 99%   BMI 20.92 kg/m?  ?Physical Exam ?Vitals and nursing note reviewed.  ?Constitutional:   ?   Appearance: Normal appearance.  ?HENT:  ?   Head: Normocephalic.  ?   Right Ear: External ear normal.  ?   Left Ear: External ear normal.  ?   Nose: Nose normal.  ?   Mouth/Throat:  ?   Pharynx: Oropharynx is clear.  ?Eyes:  ?   Extraocular Movements: Extraocular movements intact.  ?   Pupils: Pupils are equal, round, and reactive to light.  ?Cardiovascular:  ?   Rate and Rhythm: Normal rate and regular rhythm.  ?   Pulses: Normal pulses.  ?   Heart sounds: Normal heart sounds.  ?Pulmonary:  ?   Effort: Pulmonary effort is normal.  ?Abdominal:  ?   General:  Abdomen is flat. Bowel sounds are normal.  ?   Palpations: Abdomen is soft.  ?Musculoskeletal:  ?   Cervical back: Normal range of motion.  ?   Comments: Deformity ttp base left hand, decreased rom ?Abrasion bilateral palms ?Abrasion left knee ?Ttp right base fifth finger  ?Skin: ?   General: Skin is warm.  ?   Capillary Refill: Capillary refill takes less than 2 seconds.  ?Neurological:  ?   General: No focal deficit present.  ?   Mental Status: He is alert.  ?Psychiatric:     ?   Mood and Affect: Mood normal.     ?   Behavior: Behavior normal.  ? ? ?ED Results / Procedures / Treatments   ?Labs ?(all labs ordered are listed, but only abnormal results are displayed) ?Labs Reviewed - No data to display ? ?EKG ?None ? ?Radiology ?DG Knee Complete 4 Views Right ? ?Result Date: 11/16/2021 ?CLINICAL DATA:  Pain after fall. EXAM: RIGHT KNEE - COMPLETE 4+ VIEW COMPARISON:   None Available. FINDINGS: Minimal medial compartment joint space narrowing. Mild spurring at the superior aspect of the tibial tubercle at the patellar tendon insertion. Mild inferior patellar degenerative osteophytosis. No acute fracture. No joint effusion. IMPRESSION: Minimal medial and patellofemoral compartment osteoarthritis. Electronically Signed   By: Yvonne Kendall M.D.   On: 11/16/2021 18:17  ? ?DG Hand Complete Left ? ?Result Date: 11/16/2021 ?CLINICAL DATA:  Pain after fall.  Left fourth and fifth digit pain EXAM: LEFT HAND - COMPLETE 3+ VIEW COMPARISON:  Left fifth finger radiographs earlier today FINDINGS: Redemonstration of oblique fracture of the proximal metaphysis of the proximal phalanx of the fifth finger with approximately 2 mm lateral displacement of the distal fracture component and moderate volar apex angulation. There is again mild impaction. There is acute angulation of the medial aspect of the proximal metaphysis of the proximal phalanx of fourth finger additional mildly impacted but not significantly displaced extra-articular fracture. Severe thumb carpometacarpal and moderate triscaphe joint space narrowing. Moderate to severe peripheral thumb carpometacarpal degenerative osteophytes with subchondral sclerosis and degenerative cystic changes. IMPRESSION:: IMPRESSION: 1. Acute mildly impacted and moderately angulated fracture of the proximal metaphysis of fifth finger with extension to the medial articular surface. 2. Mild acute angulation of the lateral cortex of the proximal metaphysis of the proximal phalanx of the fourth finger, an acute fracture without significant displacement. Electronically Signed   By: Yvonne Kendall M.D.   On: 11/16/2021 18:20  ? ?DG Finger Little Left ? ?Result Date: 11/16/2021 ?CLINICAL DATA:  Fall EXAM: LEFT LITTLE FINGER 2+V COMPARISON:  None Available. FINDINGS: Fracture base of proximal fifth phalanx. Mild impaction and angulation. Fracture appears extend into  the MCP joint. Possible fracture base of fourth proximal phalanx. IMPRESSION: Displaced fracture base of fifth proximal phalanx. Possible fracture at the base of the fourth proximal phalanx, not fully evaluated. Correlate with symptoms and follow-up x-rays if appropriate. Electronically Signed   By: Franchot Gallo M.D.   On: 11/16/2021 15:50   ? ?Procedures ?[facility] Injury Treatment ? ?Date/Time: 11/18/2021 3:30 PM ?Performed by: Pattricia Boss, MD ?Authorized by: Pattricia Boss, MD  ? ?Consent:  ?  Consent obtained:  Verbal ?  Risks discussed:  Fracture ?  Alternatives discussed:  No treatmentInjury location: finger ?Location details: left little finger ?Injury type: fracture-dislocation ?Fracture type: proximal phalanx ?MCP joint involved: no ?IP joint involved: no ?Pre-procedure neurovascular assessment: neurovascularly intact ?Pre-procedure distal perfusion: normal ?Pre-procedure neurological function: normal ?Pre-procedure range of  motion: reduced ?Anesthesia: local infiltration ? ?Anesthesia: ?Local anesthesia used: yes ?Local Anesthetic: lidocaine 1% with epinephrine ?Anesthetic total: 5 mL ? ?Patient sedated: NoManipulation performed: yes ?Immobilization: splint ?Post-procedure neurovascular assessment: post-procedure neurovascularly intact ?Post-procedure distal perfusion: normal ?Post-procedure neurological function: normal ?Post-procedure range of motion: unchanged ? ?  ? ? ?Medications Ordered in ED ?Medications  ?lidocaine (PF) (XYLOCAINE) 1 % injection 30 mL (30 mLs Other Given by Other 11/16/21 1830)  ? ? ?ED Course/ Medical Decision Making/ A&P ?  ?                        ?Medical Decision Making ?76 year old male with mechanical fall today.  He has multiple abrasions and contusions.  He has a fracture of the left fifth and fourth proximal phalanx of his left hand with some angulation.  Reduction was performed of the left fifth proximal phalanx here in the ED and splint was placed ?Patient is a Radiographer, therapeutic.   His care was discussed with the hand surgeon on-call, Dr. Apolonio Schneiders.  This was discussed through the [facility].  He will see the patient in his office at noon tomorrow.  I discussed the necessity of the close follow-u

## 2021-11-17 DIAGNOSIS — S62616A Displaced fracture of proximal phalanx of right little finger, initial encounter for closed fracture: Secondary | ICD-10-CM | POA: Diagnosis not present

## 2021-11-17 DIAGNOSIS — M79642 Pain in left hand: Secondary | ICD-10-CM | POA: Diagnosis not present

## 2021-11-17 DIAGNOSIS — S62614A Displaced fracture of proximal phalanx of right ring finger, initial encounter for closed fracture: Secondary | ICD-10-CM | POA: Diagnosis not present

## 2021-11-20 DIAGNOSIS — S62617A Displaced fracture of proximal phalanx of left little finger, initial encounter for closed fracture: Secondary | ICD-10-CM | POA: Diagnosis not present

## 2021-11-20 DIAGNOSIS — S62614A Displaced fracture of proximal phalanx of right ring finger, initial encounter for closed fracture: Secondary | ICD-10-CM | POA: Diagnosis not present

## 2021-11-20 DIAGNOSIS — S62616A Displaced fracture of proximal phalanx of right little finger, initial encounter for closed fracture: Secondary | ICD-10-CM | POA: Diagnosis not present

## 2021-11-20 DIAGNOSIS — S62615A Displaced fracture of proximal phalanx of left ring finger, initial encounter for closed fracture: Secondary | ICD-10-CM | POA: Diagnosis not present

## 2021-11-22 ENCOUNTER — Telehealth (INDEPENDENT_AMBULATORY_CARE_PROVIDER_SITE_OTHER): Payer: Medicare PPO | Admitting: Family Medicine

## 2021-11-22 ENCOUNTER — Encounter: Payer: Self-pay | Admitting: Family Medicine

## 2021-11-22 DIAGNOSIS — S80212D Abrasion, left knee, subsequent encounter: Secondary | ICD-10-CM

## 2021-11-22 DIAGNOSIS — L03116 Cellulitis of left lower limb: Secondary | ICD-10-CM | POA: Diagnosis not present

## 2021-11-22 MED ORDER — CEPHALEXIN 500 MG PO CAPS: 500.0000 mg | ORAL_CAPSULE | Freq: Four times a day (QID) | ORAL | 0 refills | Status: DC

## 2021-11-22 NOTE — Progress Notes (Signed)
Patient ID: Nathan [name], male   DOB: November 06, 1945, 76 y.o.   MRN: [id number] ? ?Virtual Visit via Telephone Note ? ?I connected with Nathan [name] on 11/22/21 at  5:00 PM EDT by telephone and verified that I am speaking with the correct person using two identifiers. ?  ?I discussed the limitations, risks, security and privacy concerns of performing an evaluation and management service by telephone and the availability of in person appointments. I also discussed with the patient that there may be a patient responsible charge related to this service. The patient expressed understanding and agreed to proceed. ? ?Location patient: home ?Location provider: work or home office ?Participants present for the call: patient, provider ?Patient did not have a visit in the prior 7 days to address this/these issue(s). ? ? ?History of Present Illness: ? ?Nathan [name] had a fall last Thursday.  He went to the ER and sustained right fifth finger injury and bilateral palm abrasions as well as abrasion left knee.  He had called earlier today with some surrounding erythema and send in a picture of his fairly large abrasion.  He does think the erythema has increased somewhat in a peripheral zone around the abrasion past couple days.  Slight warmth.  Slightly tender.  No fevers or chills.  No known history of MRSA.  Multiple drug allergies.  Has tolerated doxycycline and Keflex in the past. ? ?Past Medical History:  ?Diagnosis Date  ? Colon polyps   ? Sessile Serrated adenomas  ? DYSPNEA 02/09/2009  ? RESTLESS LEGS SYNDROME 02/28/2009  ? SEIZURE DISORDER 02/09/2009  ? last seisure Sep 10, 2001  ? ?Past Surgical History:  ?Procedure Laterality Date  ? COLONOSCOPY    ? HERNIA REPAIR Right 02/11/09  ? dr. Donella Stade -[facility]  ? HERNIA REPAIR Left   ? LAPAROSCOPIC APPENDECTOMY  06/01/2011  ? Procedure: APPENDECTOMY LAPAROSCOPIC;  Surgeon: Shann Medal, MD;  Location: [facility];  Service: General;  Laterality: N/A;  ? POLYPECTOMY    ? ? reports that he has  never smoked. He has never used smokeless tobacco. He reports that he does not drink alcohol and does not use drugs. ?family history includes Cancer in his father and mother; Diabetes Mellitus II in his father. ?Allergies  ?Allergen Reactions  ? Suprep [Na Sulfate-K Sulfate-Mg Sulf] Nausea And Vomiting  ?  Hyponatremia leading to seizure  ? Carbamazepine   ?  REACTION: fatigue, sleepy  ? Ciprofloxacin   ?  Per pt, "thinks caused rash"  ? Clindamycin/Lincomycin Diarrhea  ? Metronidazole   ?  REACTION: GI upset  ? Penicillins   ?  REACTION: childhood, hives?  ? Phenytoin   ?  REACTION: rash, leg swelling  ? Sulfamethoxazole Other (See Comments)  ? Trimethoprim Other (See Comments)  ? Bactrim Other (See Comments)  ?  Caused headaches, that progressively worsened when on medication. Once off, they went away.  ? Phenytoin Sodium Extended Swelling and Rash  ?  Swelling of ankles. Happened years ago per patient.  ? ? ? ?  ?Observations/Objective: ?Patient sounds cheerful and well on the phone. ?I do not appreciate any SOB. ?Speech and thought processing are grossly intact. ?Patient reported vitals: ? ?Assessment and Plan: ? ?Large abrasion left knee.  Has some mild surrounding erythema which suggest probably early cellulitis ? ?-Start Keflex 500 mg 4 times daily for 7 days ?-Follow-up promptly for any fever, chills, or progressive erythema. ?-Keep clean with soap and water ? ?Follow Up Instructions: ? ? ? ?  99441 5-10 ?99442 11-20 ?99443 21-30 ?I did not refer this patient for an OV in the next 24 hours for this/these issue(s). ? ?I discussed the assessment and treatment plan with the patient. The patient was provided an opportunity to ask questions and all were answered. The patient agreed with the plan and demonstrated an understanding of the instructions. ?  ?The patient was advised to call back or seek an in-person evaluation if the symptoms worsen or if the condition fails to improve as anticipated. ? ?I provided 11  minutes of non-face-to-face time during this encounter. ? ? ?Carolann Littler, MD  ? ?

## 2021-11-24 ENCOUNTER — Ambulatory Visit: Payer: Medicare PPO | Admitting: Family Medicine

## 2021-11-24 ENCOUNTER — Encounter: Payer: Self-pay | Admitting: Family Medicine

## 2021-11-24 VITALS — BP 124/66 | HR 71 | Temp 98.4°F | Ht 71.0 in | Wt 148.7 lb

## 2021-11-24 DIAGNOSIS — S62609D Fracture of unspecified phalanx of unspecified finger, subsequent encounter for fracture with routine healing: Secondary | ICD-10-CM | POA: Diagnosis not present

## 2021-11-24 DIAGNOSIS — S80212D Abrasion, left knee, subsequent encounter: Secondary | ICD-10-CM

## 2021-11-24 DIAGNOSIS — L03116 Cellulitis of left lower limb: Secondary | ICD-10-CM

## 2021-11-24 NOTE — Progress Notes (Signed)
? ?Established Patient Office Visit ? ?Subjective   ?Patient ID: Nathan [name], male    DOB: 05/19/1946  Age: 76 y.o. MRN: [id number] ? ?Chief Complaint  ?Patient presents with  ? Follow-up  ? ? ?HPI ? ? ?For follow-up regarding large abrasion left knee with probably some early cellulitis changes.  Refer to virtual visit from 5/10 for details.  We did place him on Keflex at that time because of some surrounding erythema large left knee abrasion.  He had fall at home and sustained fracture proximal metaphysis of the fifth finger which was impacted and angulated.  Also had evidence for mild angulation and fracture of the proximal metaphysis of the proximal phalanx of the fourth finger.  He required surgery for both fingers and is recovering from that.  Overall doing fairly well.  He has as expected swelling of the left hand following his surgery.  His left knee still has some mild erythema but no progression in erythema. ? ?Past Medical History:  ?Diagnosis Date  ? Colon polyps   ? Sessile Serrated adenomas  ? DYSPNEA 02/09/2009  ? RESTLESS LEGS SYNDROME 02/28/2009  ? SEIZURE DISORDER 02/09/2009  ? last seisure Sep 10, 2001  ? ?Past Surgical History:  ?Procedure Laterality Date  ? COLONOSCOPY    ? HERNIA REPAIR Right 02/11/09  ? dr. Donella Stade -[facility]  ? HERNIA REPAIR Left   ? LAPAROSCOPIC APPENDECTOMY  06/01/2011  ? Procedure: APPENDECTOMY LAPAROSCOPIC;  Surgeon: Shann Medal, MD;  Location: [facility];  Service: General;  Laterality: N/A;  ? POLYPECTOMY    ? ? reports that he has never smoked. He has never used smokeless tobacco. He reports that he does not drink alcohol and does not use drugs. ?family history includes Cancer in his father and mother; Diabetes Mellitus II in his father. ?Allergies  ?Allergen Reactions  ? Suprep [Na Sulfate-K Sulfate-Mg Sulf] Nausea And Vomiting  ?  Hyponatremia leading to seizure  ? Carbamazepine   ?  REACTION: fatigue, sleepy  ? Ciprofloxacin   ?  Per pt, "thinks caused rash"  ?  Clindamycin/Lincomycin Diarrhea  ? Metronidazole   ?  REACTION: GI upset  ? Penicillins   ?  REACTION: childhood, hives?  ? Phenytoin   ?  REACTION: rash, leg swelling  ? Sulfamethoxazole Other (See Comments)  ? Trimethoprim Other (See Comments)  ? Bactrim Other (See Comments)  ?  Caused headaches, that progressively worsened when on medication. Once off, they went away.  ? Phenytoin Sodium Extended Swelling and Rash  ?  Swelling of ankles. Happened years ago per patient.  ? ? ?Review of Systems  ?Constitutional:  Negative for chills and fever.  ? ?  ?Objective:  ?  ? ?BP 124/66 (BP Location: Left Arm, Patient Position: Sitting, Cuff Size: Normal)   Pulse 71   Temp 98.4 ?F (36.9 ?C) (Oral)   Ht '5\' 11"'$  (1.803 m)   Wt 148 lb 11.2 oz (67.4 kg)   SpO2 98%   BMI 20.74 kg/m?  ? ? ?Physical Exam ?Vitals reviewed.  ?Constitutional:   ?   Appearance: Normal appearance.  ?Cardiovascular:  ?   Rate and Rhythm: Normal rate.  ?Musculoskeletal:  ?   Comments: Left hand reveals significant swelling and bruising from recent injury  ?Skin: ?   Comments: Fairly large abrasion left anterior knee.  Small surrounding zone of erythema which is about 1/2 cm around the wound edges.  No erythematous streaks.  ?Neurological:  ?   Mental  Status: He is alert.  ? ? ? ?No results found for any visits on 11/24/21. ? ? ? ?The ASCVD Risk score (Arnett DK, et al., 2019) failed to calculate for the following reasons: ?  The valid HDL cholesterol range is 20 to 100 mg/dL ? ?  ?Assessment & Plan:  ? ?Recent fall with fracture involving the proximal phalanx of the fourth and fifth digits requiring surgery.  He has abrasion left knee and had some probably early cellulitis changes currently treated with Keflex.  No progressive cellulitis findings. ? ?-Keep wound clean with soap and water ?-Consider small amount of Vaseline to prevent drying.  Follow-up for any progressive erythema, warmth, or other concerns. ? ?No follow-ups on file.  ? ? ?Carolann Littler, MD ? ?

## 2021-11-24 NOTE — Patient Instructions (Signed)
Continue to clean wound daily with soap and water ? ?Consider small amount of vaseline to knee abrasion to prevent excessive dryness.   ?

## 2021-12-01 DIAGNOSIS — M25642 Stiffness of left hand, not elsewhere classified: Secondary | ICD-10-CM | POA: Diagnosis not present

## 2021-12-01 DIAGNOSIS — S62615D Displaced fracture of proximal phalanx of left ring finger, subsequent encounter for fracture with routine healing: Secondary | ICD-10-CM | POA: Diagnosis not present

## 2021-12-05 ENCOUNTER — Encounter: Payer: Self-pay | Admitting: Family Medicine

## 2021-12-05 ENCOUNTER — Ambulatory Visit: Payer: Medicare PPO | Admitting: Family Medicine

## 2021-12-05 VITALS — BP 128/60 | HR 69 | Temp 97.6°F | Ht 71.0 in | Wt 148.2 lb

## 2021-12-05 DIAGNOSIS — M25561 Pain in right knee: Secondary | ICD-10-CM

## 2021-12-05 DIAGNOSIS — M25642 Stiffness of left hand, not elsewhere classified: Secondary | ICD-10-CM | POA: Diagnosis not present

## 2021-12-05 NOTE — Progress Notes (Signed)
Established Patient Office Visit  Subjective   Patient ID: Nathan [name], male    DOB: [birth year]  Age: 76 y.o. MRN: [id number]  Chief Complaint  Patient presents with   Fall    Patient complains of fall, x2 weeks, Patient injured 2 fingers on left hand and both lower extremities    HPI   Seen with right knee pain following recent fall May 4.  Fall occurred in his driveway.  He landed on his hands and knees.  He sustained abrasion of the left knee and also noticed some right knee pain afterwards. He had closed fracture of the proximal phalanx of the fourth and fifth digits which required surgery.  When he presented to the ER on the fourth he had x-rays of the right knee which showed no significant degenerative arthritis and no acute bony abnormality of the right knee.  Did not have much pain until several days after his fall.  Now has had some right medial knee pain along with some mild swelling.  No erythema.  No ecchymosis.  No warmth.  Pain seems to be localized around the medial joint space.  No locking or giving way.  Has tried some icing.  No calf pain.  Past Medical History:  Diagnosis Date   Colon polyps    Sessile Serrated adenomas   DYSPNEA 02/09/2009   RESTLESS LEGS SYNDROME 02/28/2009   SEIZURE DISORDER 02/09/2009   last seisure Sep 10, 2001   Past Surgical History:  Procedure Laterality Date   COLONOSCOPY     HERNIA REPAIR Right 02/11/09   dr. Donella Stade -[facility]   HERNIA REPAIR Left    LAPAROSCOPIC APPENDECTOMY  06/01/2011   Procedure: APPENDECTOMY LAPAROSCOPIC;  Surgeon: Shann Medal, MD;  Location: [facility];  Service: General;  Laterality: N/A;   POLYPECTOMY      reports that he has never smoked. He has never used smokeless tobacco. He reports that he does not drink alcohol and does not use drugs. family history includes Cancer in his father and mother; Diabetes Mellitus II in his father. Allergies  Allergen Reactions   Suprep [Na Sulfate-K Sulfate-Mg Sulf] Nausea  And Vomiting    Hyponatremia leading to seizure   Carbamazepine     REACTION: fatigue, sleepy   Ciprofloxacin     Per pt, "thinks caused rash"   Clindamycin/Lincomycin Diarrhea   Metronidazole     REACTION: GI upset   Penicillins     REACTION: childhood, hives?   Phenytoin     REACTION: rash, leg swelling   Sulfamethoxazole Other (See Comments)   Trimethoprim Other (See Comments)   Bactrim Other (See Comments)    Caused headaches, that progressively worsened when on medication. Once off, they went away.   Phenytoin Sodium Extended Swelling and Rash    Swelling of ankles. Happened years ago per patient.    Review of Systems  Constitutional:  Negative for chills and fever.     Objective:     BP 128/60 (BP Location: Left Arm, Patient Position: Sitting, Cuff Size: Normal)   Pulse 69   Temp 97.6 F (36.4 C) (Oral)   Ht '5\' 11"'$  (1.803 m)   Wt 148 lb 3.2 oz (67.2 kg)   SpO2 97%   BMI 20.67 kg/m    Physical Exam Vitals reviewed.  Constitutional:      Appearance: Normal appearance.  Cardiovascular:     Rate and Rhythm: Normal rate and regular rhythm.  Pulmonary:     Effort: Pulmonary  effort is normal.     Breath sounds: Normal breath sounds.  Musculoskeletal:     Comments: Left knee reveals healing abrasion.  Much smaller in size compared with last visit.  Right knee reveals mild swelling.  No warmth.  No erythema.  No ecchymosis.  Does have some medial joint line tenderness.  No lateral tenderness.  Collateral ligament testing is normal.  Neurological:     Mental Status: He is alert.     No results found for any visits on 12/05/21.    The ASCVD Risk score (Arnett DK, et al., 2019) failed to calculate for the following reasons:   The valid HDL cholesterol range is 20 to 100 mg/dL    Assessment & Plan:   Problem List Items Addressed This Visit   None Visit Diagnoses     Acute pain of right knee    -  Primary   Relevant Orders   Ambulatory referral to  Sports Medicine     Patient has right medial knee pain following mechanical fall at home.  X-rays in ER were negative for fracture.  Question medial meniscus injury.  His swelling did not seem to start until well after the initial fall. -Continue with icing -Set up sports medicine referral for further assessment  No follow-ups on file.    Carolann Littler, MD

## 2021-12-06 ENCOUNTER — Other Ambulatory Visit: Payer: Self-pay | Admitting: Family Medicine

## 2021-12-06 ENCOUNTER — Ambulatory Visit: Payer: Medicare PPO | Admitting: Family Medicine

## 2021-12-06 ENCOUNTER — Ambulatory Visit (INDEPENDENT_AMBULATORY_CARE_PROVIDER_SITE_OTHER): Payer: Medicare PPO

## 2021-12-06 ENCOUNTER — Ambulatory Visit: Payer: Self-pay

## 2021-12-06 VITALS — BP 126/74 | HR 59 | Ht 71.0 in | Wt 148.0 lb

## 2021-12-06 DIAGNOSIS — M25561 Pain in right knee: Secondary | ICD-10-CM

## 2021-12-06 MED ORDER — VITAMIN D (ERGOCALCIFEROL) 1.25 MG (50000 UNIT) PO CAPS: 50000.0000 [IU] | ORAL_CAPSULE | ORAL | 0 refills | Status: DC

## 2021-12-06 NOTE — Assessment & Plan Note (Signed)
Patient's right knee pain seems to be out of proportion.  On ultrasound there does appear to be a medial condyle cortical defect noted.  Concern for possible early OCD or occult fracture.  Patient could have an insufficiency fracture with history of the osteoporosis.  Discussed which activities to do in the meantime as we await advanced imaging with the MRI.  Patient still has what appears to be a small hemarthrosis as well.  Follow-up with me again after imaging to discuss different treatment options

## 2021-12-06 NOTE — Patient Instructions (Signed)
[facility] [phone] Call Today  When we receive your results we will contact you.  No exercises until we know more Vit D prescription filled

## 2021-12-06 NOTE — Progress Notes (Signed)
Zach Alonnie Bieker Keo [address] [place] Phone: [phone] Subjective:   IVilma Meckel, am serving as a scribe for Dr. Hulan Saas.  I'm seeing this patient by the request  of:  [name], MD  CC: Right knee pain  AYO:[id number]  Nathan [name] is a 76 y.o. male coming in with complaint of R knee pain. Patient states hasn't been doing really well. Fell on knee on the right. About a week ago started swelling. Constant pain at this point. Did try ice.  Xray R knee 11/16/2021 FINDINGS: Minimal medial compartment joint space narrowing. Mild spurring at the superior aspect of the tibial tubercle at the patellar tendon insertion. Mild inferior patellar degenerative osteophytosis. No acute fracture. No joint effusion.   IMPRESSION: Minimal medial and patellofemoral compartment osteoarthritis. When I independently look at the x-ray and there is a possibility for a subluxation of the patella initially.    Past Medical History:  Diagnosis Date   Colon polyps    Sessile Serrated adenomas   DYSPNEA 02/09/2009   RESTLESS LEGS SYNDROME 02/28/2009   SEIZURE DISORDER 02/09/2009   last seisure Sep 10, 2001   Past Surgical History:  Procedure Laterality Date   COLONOSCOPY     HERNIA REPAIR Right 02/11/09   dr. Donella [facility]ade -[facility]   HERNIA REPAIR Left    LAPAROSCOPIC APPENDECTOMY  06/01/2011   Procedure: APPENDECTOMY LAPAROSCOPIC;  Surgeon: Shann Medal, MD;  Location: [facility];  Service: General;  Laterality: N/A;   POLYPECTOMY     Social History   Socioeconomic History   Marital status: Married    Spouse name: Thayer Headings   Number of children: 0   Years of education: 2 MA's    Highest education level: Not on file  Occupational History   Occupation: Retired Product manager: OTHER    Comment: Part Time: [employer]  Tobacco Use   Smoking status: Never   Smokeless tobacco: Never  Vaping Use   Vaping Use: Never used   Substance and Sexual Activity   Alcohol use: No    Alcohol/week: 0.0 standard drinks   Drug use: No   Sexual activity: Not on file  Other Topics Concern   Not on file  Social History Narrative   Patient lives at home with spouse.   Caffeine Use: quit 1980   Lives in a one story home. No chidren.   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)   Social Determinants of Health   Financial Resource Strain: Low Risk    Difficulty of Paying Living Expenses: Not hard at all  Food Insecurity: No Food Insecurity   Worried About Charity fundraiser in the Last Year: Never true   [place] in the Last Year: Never true  Transportation Needs: No Transportation Needs   Lack of Transportation (Medical): No   Lack of Transportation (Non-Medical): No  Physical Activity: Sufficiently Active   Days of Exercise per Week: 5 days   Minutes of Exercise per Session: 40 min  Stress: No Stress Concern Present   Feeling of Stress : Not at all  Social Connections: Unknown   Frequency of Communication with Friends and Family: Three times a week   Frequency of Social Gatherings with Friends and Family: Patient refused   Attends Religious Services: Patient refused   Active Member of Clubs or Organizations: Yes   Attends Archivist Meetings: Patient refused  Marital Status: Married   Allergies  Allergen Reactions   Suprep [Na Sulfate-K Sulfate-Mg Sulf] Nausea And Vomiting    Hyponatremia leading to seizure   Carbamazepine     REACTION: fatigue, sleepy   Ciprofloxacin     Per pt, "thinks caused rash"   Clindamycin/Lincomycin Diarrhea   Metronidazole     REACTION: GI upset   Penicillins     REACTION: childhood, hives?   Phenytoin     REACTION: rash, leg swelling   Sulfamethoxazole Other (See Comments)   Trimethoprim Other (See Comments)   Bactrim Other (See Comments)    Caused headaches, that progressively worsened when on medication. Once off, they  went away.   Phenytoin Sodium Extended Swelling and Rash    Swelling of ankles. Happened years ago per patient.   Family History  Problem Relation Age of Onset   Cancer Mother        unknown type but thinks "male organs"   Cancer Father        multiple myeloma   Diabetes Mellitus II Father    Stroke Neg Hx        grandparent   Colon cancer Neg Hx    Esophageal cancer Neg Hx    Rectal cancer Neg Hx    Stomach cancer Neg Hx    Pancreatic cancer Neg Hx    Liver disease Neg Hx         Current Outpatient Medications (Hematological):    cyanocobalamin 500 MCG tablet, Take 1,000 mcg by mouth daily.   folic acid (FOLVITE) 1 MG tablet, Take 1 mg by mouth daily.  Current Outpatient Medications (Other):    Vitamin D, Ergocalciferol, (DRISDOL) 1.25 MG (50000 UNIT) CAPS capsule, Take 1 capsule (50,000 Units total) by mouth every 7 (seven) days.   cephALEXin (KEFLEX) 500 MG capsule, Take 1 capsule (500 mg total) by mouth 4 (four) times daily.   cholecalciferol (VITAMIN D) 1000 UNITS tablet, Take 1,000 Units by mouth daily.   Cod Liver Oil OIL, Take 5 mLs by mouth daily.   Coenzyme Q10 (CO Q 10) 60 MG CAPS, Take 1 tablet by mouth daily.   Homeopathic Products (SIMILASAN DRY EYE RELIEF) SOLN, Apply 1 drop to eye daily as needed. Dry Eyes   OVER THE COUNTER MEDICATION, AlgeaCal   OVER THE COUNTER MEDICATION, Strontium citrate   PHENObarbital (LUMINAL) 60 MG tablet, TAKE ONE TABLET BY MOUTH AT BEDTIME   Probiotic Product (PROBIOTIC DAILY PO), Take by mouth daily.   vitamin C (ASCORBIC ACID) 500 MG tablet, Take 500 mg by mouth 3 (three) times daily.   zinc gluconate 50 MG tablet, Take 50 mg by mouth daily.   Reviewed prior external information including notes and imaging from  primary care provider As well as notes that were available from care everywhere and other healthcare systems.  Past medical history, social, surgical and family history all reviewed in electronic medical record.   No pertanent information unless stated regarding to the chief complaint.   Review of Systems:  No headache, visual changes, nausea, vomiting, diarrhea, constipation, dizziness, abdominal pain, skin rash, fevers, chills, night sweats, weight loss, swollen lymph nodes, body aches, joint swelling, chest pain, shortness of breath, mood changes. POSITIVE muscle aches  Objective  Blood pressure 126/74, pulse (!) 59, height _0  (1.803 m), weight 148 lb (67.1 kg), SpO2 95 %.   General: No apparent distress alert and oriented x3 mood and affect normal, dressed appropriately.  HEENT: Pupils equal, extraocular movements  intact  Respiratory: Patient's speak in full sentences and does not appear short of breath  Cardiovascular: No lower extremity edema, non tender, no erythema  Right knee exam shows effusion noted.  Having difficulty with ambulation noted.  Patient has difficulty even putting weight on the knee.  Lacks last 15 degrees of flexion.  No effusion noted of the patellofemoral joint.  Limited muscular skeletal ultrasound was performed and interpreted by Hulan Saas, M  Limited ultrasound shows the patient does have hypoechoic changes noted.  Seems to have some hyperechoic changes also noted that can be consistent with coagulated blood hemarthrosis noted.  Fairly large.  Patient's medial condyle does have a cortical irregularity noted with increasing neovascularization to the cortex of the bone. Impression: Concern for medial condyle injury with hemarthrosis   Impression and Recommendations:     The above documentation has been reviewed and is accurate and complete Lyndal Pulley, DO

## 2021-12-07 ENCOUNTER — Telehealth: Payer: Self-pay | Admitting: Family Medicine

## 2021-12-07 NOTE — Telephone Encounter (Signed)
Responded to patient via MyChart.

## 2021-12-07 NOTE — Telephone Encounter (Signed)
Pt has some concerns about the Vit D2 that we rx'd yesterday. He has been taking VitD3 and prefers this based on better absorption and the lack of dye in the pills.  Wanted Dr. Thompson Caul opinion , [place] to respond via [place]

## 2021-12-12 DIAGNOSIS — M25642 Stiffness of left hand, not elsewhere classified: Secondary | ICD-10-CM | POA: Diagnosis not present

## 2021-12-19 DIAGNOSIS — M25642 Stiffness of left hand, not elsewhere classified: Secondary | ICD-10-CM | POA: Diagnosis not present

## 2021-12-20 ENCOUNTER — Ambulatory Visit
Admission: RE | Admit: 2021-12-20 | Discharge: 2021-12-20 | Disposition: A | Discharge status: Home / Self Care | Payer: Medicare PPO | Source: Ambulatory Visit | Attending: Family Medicine | Admitting: Family Medicine

## 2021-12-20 DIAGNOSIS — M660 Rupture of popliteal cyst: Secondary | ICD-10-CM | POA: Diagnosis not present

## 2021-12-20 DIAGNOSIS — M25561 Pain in right knee: Secondary | ICD-10-CM

## 2021-12-20 DIAGNOSIS — S83241A Other tear of medial meniscus, current injury, right knee, initial encounter: Secondary | ICD-10-CM | POA: Diagnosis not present

## 2021-12-20 DIAGNOSIS — S83271A Complex tear of lateral meniscus, current injury, right knee, initial encounter: Secondary | ICD-10-CM | POA: Diagnosis not present

## 2021-12-20 DIAGNOSIS — M7989 Other specified soft tissue disorders: Secondary | ICD-10-CM | POA: Diagnosis not present

## 2021-12-22 DIAGNOSIS — S62615D Displaced fracture of proximal phalanx of left ring finger, subsequent encounter for fracture with routine healing: Secondary | ICD-10-CM | POA: Diagnosis not present

## 2021-12-26 DIAGNOSIS — M25642 Stiffness of left hand, not elsewhere classified: Secondary | ICD-10-CM | POA: Diagnosis not present

## 2021-12-29 DIAGNOSIS — M25561 Pain in right knee: Secondary | ICD-10-CM | POA: Diagnosis not present

## 2022-01-02 DIAGNOSIS — M25642 Stiffness of left hand, not elsewhere classified: Secondary | ICD-10-CM | POA: Diagnosis not present

## 2022-01-08 ENCOUNTER — Telehealth: Payer: Self-pay | Admitting: Podiatry

## 2022-01-09 ENCOUNTER — Ambulatory Visit: Payer: Medicare PPO | Admitting: Podiatry

## 2022-01-09 ENCOUNTER — Encounter: Payer: Self-pay | Admitting: Podiatry

## 2022-01-09 DIAGNOSIS — D2372 Other benign neoplasm of skin of left lower limb, including hip: Secondary | ICD-10-CM

## 2022-01-09 DIAGNOSIS — B351 Tinea unguium: Secondary | ICD-10-CM | POA: Diagnosis not present

## 2022-01-09 DIAGNOSIS — M79676 Pain in unspecified toe(s): Secondary | ICD-10-CM

## 2022-01-11 DIAGNOSIS — M25642 Stiffness of left hand, not elsewhere classified: Secondary | ICD-10-CM | POA: Diagnosis not present

## 2022-01-18 DIAGNOSIS — M25642 Stiffness of left hand, not elsewhere classified: Secondary | ICD-10-CM | POA: Diagnosis not present

## 2022-01-19 DIAGNOSIS — M79642 Pain in left hand: Secondary | ICD-10-CM | POA: Diagnosis not present

## 2022-01-19 DIAGNOSIS — S62615A Displaced fracture of proximal phalanx of left ring finger, initial encounter for closed fracture: Secondary | ICD-10-CM | POA: Diagnosis not present

## 2022-01-23 DIAGNOSIS — M25642 Stiffness of left hand, not elsewhere classified: Secondary | ICD-10-CM | POA: Diagnosis not present

## 2022-02-06 ENCOUNTER — Ambulatory Visit: Payer: Medicare PPO | Admitting: Family Medicine

## 2022-02-06 ENCOUNTER — Encounter: Payer: Self-pay | Admitting: Family Medicine

## 2022-02-06 VITALS — BP 122/70 | HR 64 | Temp 97.6°F | Ht 71.0 in | Wt 148.0 lb

## 2022-02-06 DIAGNOSIS — N644 Mastodynia: Secondary | ICD-10-CM | POA: Diagnosis not present

## 2022-02-06 DIAGNOSIS — N62 Hypertrophy of breast: Secondary | ICD-10-CM | POA: Diagnosis not present

## 2022-02-06 DIAGNOSIS — M25642 Stiffness of left hand, not elsewhere classified: Secondary | ICD-10-CM | POA: Diagnosis not present

## 2022-02-06 NOTE — Progress Notes (Signed)
Established Patient Office Visit  Subjective   Patient ID: Nathan [name], male    DOB: 08/20/1945  Age: 76 y.o. MRN: [id number]  Chief Complaint  Patient presents with   Breast Pain    Patient complains of right breast pain, x1 week     HPI   Seen with right breast tenderness for the past week.Marland Kitchen  Has had similar process with left breast previously.  Had mammogram years ago which was normal.  Denies any nipple inversion.  No bloody discharge.  No galactorrhea.  No skin dimpling.  Denies any recent injury.  No overlying erythema.  Tenderness is somewhat diffuse.  Has not noted any distinct masses.  No axillary adenopathy.  Appetite and weight stable.  No new supplements.  He takes phenobarbital but has been on this for years.  Past Medical History:  Diagnosis Date   Colon polyps    Sessile Serrated adenomas   DYSPNEA 02/09/2009   RESTLESS LEGS SYNDROME 02/28/2009   SEIZURE DISORDER 02/09/2009   last seisure Sep 10, 2001   Past Surgical History:  Procedure Laterality Date   COLONOSCOPY     HERNIA REPAIR Right 02/11/09   dr. Donella Stade -[facility]   HERNIA REPAIR Left    LAPAROSCOPIC APPENDECTOMY  06/01/2011   Procedure: APPENDECTOMY LAPAROSCOPIC;  Surgeon: Shann Medal, MD;  Location: [facility];  Service: General;  Laterality: N/A;   POLYPECTOMY      reports that he has never smoked. He has never used smokeless tobacco. He reports that he does not drink alcohol and does not use drugs. family history includes Cancer in his father and mother; Diabetes Mellitus II in his father. Allergies  Allergen Reactions   Suprep [Na Sulfate-K Sulfate-Mg Sulf] Nausea And Vomiting    Hyponatremia leading to seizure   Carbamazepine     REACTION: fatigue, sleepy   Ciprofloxacin     Per pt, "thinks caused rash"   Clindamycin/Lincomycin Diarrhea   Metronidazole     REACTION: GI upset   Penicillins     REACTION: childhood, hives?   Phenytoin     REACTION: rash, leg swelling   Sulfamethoxazole Other  (See Comments)   Trimethoprim Other (See Comments)   Bactrim Other (See Comments)    Caused headaches, that progressively worsened when on medication. Once off, they went away.   Phenytoin Sodium Extended Swelling and Rash    Swelling of ankles. Happened years ago per patient.    Review of Systems  Constitutional:  Negative for chills, fever and weight loss.      Objective:     BP 122/70 (BP Location: Left Arm, Patient Position: Sitting, Cuff Size: Normal)   Pulse 64   Temp 97.6 F (36.4 C) (Oral)   Ht '5\' 11"'$  (1.803 m)   Wt 148 lb (67.1 kg)   SpO2 100%   BMI 20.64 kg/m  BP Readings from Last 3 Encounters:  02/06/22 122/70  12/06/21 126/74  12/05/21 128/60   Wt Readings from Last 3 Encounters:  02/06/22 148 lb (67.1 kg)  12/06/21 148 lb (67.1 kg)  12/05/21 148 lb 3.2 oz (67.2 kg)      Physical Exam Vitals reviewed.  Constitutional:      Appearance: Normal appearance.  Cardiovascular:     Rate and Rhythm: Normal rate and regular rhythm.  Pulmonary:     Effort: Pulmonary effort is normal.     Breath sounds: Normal breath sounds.     Comments: Right breast is visibly slightly larger  than the left.  No skin rashes.  No nipple inversion.  No distinct masses.  Slightly tender glandular tissue right breast.  No erythema.  No warmth.  No axillary adenopathy.  No nipple discharge. Neurological:     Mental Status: He is alert.      No results found for any visits on 02/06/22.    The ASCVD Risk score (Arnett DK, et al., 2019) failed to calculate for the following reasons:   The valid HDL cholesterol range is 20 to 100 mg/dL    Assessment & Plan:   Right breast pain.  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  Does not have any worrisome features such as bloody discharge, nipple inversion, distinct mass, skin dimpling, etc. -Recommend observation. -No indication for imaging such as diagnostic mammogram at this time.  No follow-ups on  file.    Carolann Littler, MD

## 2022-02-06 NOTE — Patient Instructions (Signed)
Suspect benign gynecomastia  Watch for any nipple inversion, bloody nipple discharge, or painless masses.

## 2022-02-09 DIAGNOSIS — M25561 Pain in right knee: Secondary | ICD-10-CM | POA: Diagnosis not present

## 2022-02-13 DIAGNOSIS — M25642 Stiffness of left hand, not elsewhere classified: Secondary | ICD-10-CM | POA: Diagnosis not present

## 2022-02-20 DIAGNOSIS — M25642 Stiffness of left hand, not elsewhere classified: Secondary | ICD-10-CM | POA: Diagnosis not present

## 2022-02-23 DIAGNOSIS — S62615A Displaced fracture of proximal phalanx of left ring finger, initial encounter for closed fracture: Secondary | ICD-10-CM | POA: Diagnosis not present

## 2022-02-26 ENCOUNTER — Ambulatory Visit (INDEPENDENT_AMBULATORY_CARE_PROVIDER_SITE_OTHER)
Admission: RE | Admit: 2022-02-26 | Discharge: 2022-02-26 | Disposition: A | Discharge status: Home / Self Care | Payer: Medicare PPO | Source: Ambulatory Visit

## 2022-02-26 DIAGNOSIS — M818 Other osteoporosis without current pathological fracture: Secondary | ICD-10-CM

## 2022-02-27 DIAGNOSIS — M25642 Stiffness of left hand, not elsewhere classified: Secondary | ICD-10-CM | POA: Diagnosis not present

## 2022-03-06 DIAGNOSIS — M25642 Stiffness of left hand, not elsewhere classified: Secondary | ICD-10-CM | POA: Diagnosis not present

## 2022-03-13 ENCOUNTER — Ambulatory Visit: Payer: Medicare PPO | Admitting: Podiatry

## 2022-03-13 DIAGNOSIS — M25642 Stiffness of left hand, not elsewhere classified: Secondary | ICD-10-CM | POA: Diagnosis not present

## 2022-03-15 DIAGNOSIS — D1801 Hemangioma of skin and subcutaneous tissue: Secondary | ICD-10-CM | POA: Diagnosis not present

## 2022-03-15 DIAGNOSIS — L570 Actinic keratosis: Secondary | ICD-10-CM | POA: Diagnosis not present

## 2022-03-15 DIAGNOSIS — D224 Melanocytic nevi of scalp and neck: Secondary | ICD-10-CM | POA: Diagnosis not present

## 2022-03-15 DIAGNOSIS — D225 Melanocytic nevi of trunk: Secondary | ICD-10-CM | POA: Diagnosis not present

## 2022-03-15 DIAGNOSIS — L821 Other seborrheic keratosis: Secondary | ICD-10-CM | POA: Diagnosis not present

## 2022-03-15 DIAGNOSIS — D2371 Other benign neoplasm of skin of right lower limb, including hip: Secondary | ICD-10-CM | POA: Diagnosis not present

## 2022-03-20 ENCOUNTER — Other Ambulatory Visit: Payer: Self-pay | Admitting: Family Medicine

## 2022-03-23 DIAGNOSIS — S62615A Displaced fracture of proximal phalanx of left ring finger, initial encounter for closed fracture: Secondary | ICD-10-CM | POA: Diagnosis not present

## 2022-03-23 DIAGNOSIS — M25642 Stiffness of left hand, not elsewhere classified: Secondary | ICD-10-CM | POA: Diagnosis not present

## 2022-03-27 DIAGNOSIS — M25642 Stiffness of left hand, not elsewhere classified: Secondary | ICD-10-CM | POA: Diagnosis not present

## 2022-04-03 ENCOUNTER — Ambulatory Visit: Payer: Medicare PPO | Admitting: Podiatry

## 2022-04-03 ENCOUNTER — Encounter: Payer: Self-pay | Admitting: Podiatry

## 2022-04-03 DIAGNOSIS — B351 Tinea unguium: Secondary | ICD-10-CM | POA: Diagnosis not present

## 2022-04-03 DIAGNOSIS — M79674 Pain in right toe(s): Secondary | ICD-10-CM | POA: Diagnosis not present

## 2022-04-03 DIAGNOSIS — M79675 Pain in left toe(s): Secondary | ICD-10-CM

## 2022-04-03 DIAGNOSIS — M542 Cervicalgia: Secondary | ICD-10-CM | POA: Diagnosis not present

## 2022-04-03 DIAGNOSIS — D2372 Other benign neoplasm of skin of left lower limb, including hip: Secondary | ICD-10-CM | POA: Diagnosis not present

## 2022-04-03 DIAGNOSIS — D2371 Other benign neoplasm of skin of right lower limb, including hip: Secondary | ICD-10-CM

## 2022-04-03 DIAGNOSIS — M25642 Stiffness of left hand, not elsewhere classified: Secondary | ICD-10-CM | POA: Diagnosis not present

## 2022-04-03 NOTE — Progress Notes (Signed)
He presents today for a chief complaint of painful elongated toenails states that he is a little late getting his nails cut since he discontinued physical therapy on his hands and has had to cancel a couple of times with [place].  Objective: Vital signs are stable alert and oriented x3 there is no erythema edema salines drainage noted toenails are long thick yellow dystrophic-like mycotic.  Assessment: Pain in limb secondary onychomycosis.  Plan: Debrided nails 1 through 5 bilateral today we will follow-up with him on an as needed basis or in 2 months.  He will be moving to [facility] so he will follow-up with [place] at the [place] office

## 2022-04-10 DIAGNOSIS — M25642 Stiffness of left hand, not elsewhere classified: Secondary | ICD-10-CM | POA: Diagnosis not present

## 2022-04-17 DIAGNOSIS — M25642 Stiffness of left hand, not elsewhere classified: Secondary | ICD-10-CM | POA: Diagnosis not present

## 2022-04-19 DIAGNOSIS — M542 Cervicalgia: Secondary | ICD-10-CM | POA: Diagnosis not present

## 2022-04-20 ENCOUNTER — Ambulatory Visit (INDEPENDENT_AMBULATORY_CARE_PROVIDER_SITE_OTHER): Payer: Medicare PPO

## 2022-04-20 DIAGNOSIS — Z23 Encounter for immunization: Secondary | ICD-10-CM

## 2022-04-24 DIAGNOSIS — M25642 Stiffness of left hand, not elsewhere classified: Secondary | ICD-10-CM | POA: Diagnosis not present

## 2022-04-26 DIAGNOSIS — S62615A Displaced fracture of proximal phalanx of left ring finger, initial encounter for closed fracture: Secondary | ICD-10-CM | POA: Diagnosis not present

## 2022-04-27 ENCOUNTER — Encounter: Payer: Self-pay | Admitting: Family Medicine

## 2022-04-27 ENCOUNTER — Ambulatory Visit: Payer: Medicare PPO | Admitting: Family Medicine

## 2022-04-27 VITALS — BP 120/70 | HR 63 | Temp 97.9°F | Ht 71.0 in | Wt 146.2 lb

## 2022-04-27 DIAGNOSIS — N644 Mastodynia: Secondary | ICD-10-CM

## 2022-04-27 NOTE — Progress Notes (Signed)
Established Patient Office Visit  Subjective   Patient ID: Nathan [name], male    DOB: February 23, 1946  Age: 76 y.o. MRN: [id number]  Chief Complaint  Patient presents with   Breast Pain    Patient complain of right breast tenderness,     HPI   Nathan [name] is here today with some persistent tenderness right breast somewhat intermittently.  Seen for this end of July.  At that point he had only noticed pain for about a week.  He had mammogram several years ago which was unremarkable.  His exam was reassuring and we suspect that he had some mild asymmetric gynecomastia.  He has not had any worrisome features such as nipple inversion, skin dimpling, or nipple discharge.  No axillary adenopathy.  He states he has a sharp pain which tends to be more sensitive when things rub against it.  He has noticed some definite enlargement of the right breast compared to the left in recent months.  Has not felt any distinct masses.  Past Medical History:  Diagnosis Date   Colon polyps    Sessile Serrated adenomas   DYSPNEA 02/09/2009   RESTLESS LEGS SYNDROME 02/28/2009   SEIZURE DISORDER 02/09/2009   last seisure Sep 10, 2001   Past Surgical History:  Procedure Laterality Date   COLONOSCOPY     HERNIA REPAIR Right 02/11/09   dr. Donella Stade -[facility]   HERNIA REPAIR Left    LAPAROSCOPIC APPENDECTOMY  06/01/2011   Procedure: APPENDECTOMY LAPAROSCOPIC;  Surgeon: Shann Medal, MD;  Location: [facility];  Service: General;  Laterality: N/A;   POLYPECTOMY      reports that he has never smoked. He has never used smokeless tobacco. He reports that he does not drink alcohol and does not use drugs. family history includes Cancer in his father and mother; Diabetes Mellitus II in his father. Allergies  Allergen Reactions   Suprep [Na Sulfate-K Sulfate-Mg Sulf] Nausea And Vomiting    Hyponatremia leading to seizure   Carbamazepine     REACTION: fatigue, sleepy   Ciprofloxacin     Per pt, "thinks caused rash"    Clindamycin/Lincomycin Diarrhea   Metronidazole     REACTION: GI upset   Penicillins     REACTION: childhood, hives?   Phenytoin     REACTION: rash, leg swelling   Sulfamethoxazole Other (See Comments)   Trimethoprim Other (See Comments)   Bactrim Other (See Comments)    Caused headaches, that progressively worsened when on medication. Once off, they went away.   Phenytoin Sodium Extended Swelling and Rash    Swelling of ankles. Happened years ago per patient.    Review of Systems  Constitutional:  Negative for weight loss.  Respiratory:  Negative for shortness of breath.       Objective:     BP 120/70 (BP Location: Left Arm, Patient Position: Sitting, Cuff Size: Normal)   Pulse 63   Temp 97.9 F (36.6 C) (Oral)   Ht '5\' 11"'$  (1.803 m)   Wt 146 lb 3.2 oz (66.3 kg)   SpO2 98%   BMI 20.39 kg/m  Wt Readings from Last 3 Encounters:  04/27/22 146 lb 3.2 oz (66.3 kg)  02/06/22 148 lb (67.1 kg)  12/06/21 148 lb (67.1 kg)      Physical Exam Vitals reviewed.  Constitutional:      Appearance: Normal appearance.  Cardiovascular:     Rate and Rhythm: Normal rate and regular rhythm.  Pulmonary:  Effort: Pulmonary effort is normal.     Breath sounds: Normal breath sounds.     Comments: Breast exam reveals some obvious mild enlargement the right breast compared to the left.  No nipple inversion.  No skin dimpling.  He has some slightly prominent glandular tissue of the right breast compared to the left but no distinct masses. Neurological:     Mental Status: He is alert.      No results found for any visits on 04/27/22.    The ASCVD Risk score (Arnett DK, et al., 2019) failed to calculate for the following reasons:   The valid HDL cholesterol range is 20 to 100 mg/dL    Assessment & Plan:   Right breast enlargement with intermittent tenderness.  Suspect mild asymmetric gynecomastia.  No worrisome findings on exam.  Patient is very concerned and requesting  mammogram.  Can set up diagnostic mammogram but suspect this will be benign.  No follow-ups on file.    Carolann Littler, MD

## 2022-04-27 NOTE — Patient Instructions (Signed)
I will place order for diagnostic mammogram- but suspect this will be benign

## 2022-05-01 DIAGNOSIS — M542 Cervicalgia: Secondary | ICD-10-CM | POA: Diagnosis not present

## 2022-05-02 ENCOUNTER — Other Ambulatory Visit: Payer: Self-pay | Admitting: Family Medicine

## 2022-05-02 DIAGNOSIS — N644 Mastodynia: Secondary | ICD-10-CM

## 2022-05-08 DIAGNOSIS — M546 Pain in thoracic spine: Secondary | ICD-10-CM | POA: Diagnosis not present

## 2022-05-08 DIAGNOSIS — M542 Cervicalgia: Secondary | ICD-10-CM | POA: Diagnosis not present

## 2022-05-14 ENCOUNTER — Ambulatory Visit
Admission: RE | Admit: 2022-05-14 | Discharge: 2022-05-14 | Disposition: A | Discharge status: Home / Self Care | Payer: Medicare PPO | Source: Ambulatory Visit | Attending: Family Medicine | Admitting: Family Medicine

## 2022-05-14 ENCOUNTER — Ambulatory Visit: Payer: Medicare PPO

## 2022-05-14 DIAGNOSIS — N644 Mastodynia: Secondary | ICD-10-CM | POA: Diagnosis not present

## 2022-05-14 DIAGNOSIS — N62 Hypertrophy of breast: Secondary | ICD-10-CM | POA: Diagnosis not present

## 2022-05-15 DIAGNOSIS — M546 Pain in thoracic spine: Secondary | ICD-10-CM | POA: Diagnosis not present

## 2022-05-22 DIAGNOSIS — H0102A Squamous blepharitis right eye, upper and lower eyelids: Secondary | ICD-10-CM | POA: Diagnosis not present

## 2022-05-22 DIAGNOSIS — H11043 Peripheral pterygium, stationary, bilateral: Secondary | ICD-10-CM | POA: Diagnosis not present

## 2022-05-22 DIAGNOSIS — M542 Cervicalgia: Secondary | ICD-10-CM | POA: Diagnosis not present

## 2022-05-22 DIAGNOSIS — H04123 Dry eye syndrome of bilateral lacrimal glands: Secondary | ICD-10-CM | POA: Diagnosis not present

## 2022-05-22 DIAGNOSIS — H2513 Age-related nuclear cataract, bilateral: Secondary | ICD-10-CM | POA: Diagnosis not present

## 2022-05-22 DIAGNOSIS — H43813 Vitreous degeneration, bilateral: Secondary | ICD-10-CM | POA: Diagnosis not present

## 2022-05-22 DIAGNOSIS — H0102B Squamous blepharitis left eye, upper and lower eyelids: Secondary | ICD-10-CM | POA: Diagnosis not present

## 2022-05-22 DIAGNOSIS — H353131 Nonexudative age-related macular degeneration, bilateral, early dry stage: Secondary | ICD-10-CM | POA: Diagnosis not present

## 2022-06-19 ENCOUNTER — Other Ambulatory Visit: Payer: Self-pay | Admitting: Family Medicine

## 2022-06-20 ENCOUNTER — Ambulatory Visit: Payer: Medicare PPO | Admitting: Family Medicine

## 2022-06-20 ENCOUNTER — Ambulatory Visit: Payer: Medicare PPO | Admitting: Podiatry

## 2022-06-20 ENCOUNTER — Encounter: Payer: Self-pay | Admitting: Family Medicine

## 2022-06-20 VITALS — BP 130/70 | HR 62 | Temp 97.6°F | Ht 71.0 in | Wt 144.7 lb

## 2022-06-20 DIAGNOSIS — R569 Unspecified convulsions: Secondary | ICD-10-CM

## 2022-06-20 DIAGNOSIS — R300 Dysuria: Secondary | ICD-10-CM | POA: Diagnosis not present

## 2022-06-20 LAB — POCT URINALYSIS DIPSTICK
Bilirubin, UA: NEGATIVE
Blood, UA: NEGATIVE
Glucose, UA: NEGATIVE
Ketones, UA: NEGATIVE
Leukocytes, UA: NEGATIVE
Nitrite, UA: NEGATIVE
Protein, UA: NEGATIVE
Spec Grav, UA: <=1.005 — AB (ref 1.010–1.025)
Urobilinogen, UA: 0.2 E.U./dL
pH, UA: 7.5 (ref 5.0–8.0)

## 2022-06-20 NOTE — Progress Notes (Signed)
Established Patient Office Visit  Subjective   Patient ID: Nathan [name], male    DOB: June 18, 1946  Age: 76 y.o. MRN: [id number]  Chief Complaint  Patient presents with   Forms    HPI   [name] is seen for the following issues  He has form that he received from the Department of Transportation from the division of motor vehicles from [place] requesting completion because of his past history of seizures.  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  These have generally been extremely well controlled on phenobarbital 60 mg nightly.  He did have 1 recurrent seizure in June 2020.  However, this occurred during prep for colonoscopy where he had recurrent vomiting and hyponatremia which was felt to be a clear trigger.  His last phenobarbital level was 4/18-23 which was 8.3.  He has had absolutely no seizures since then.  He does not have any major visual issues, cardiovascular disorder, endocrine disorder, respiratory disorder, mental illness, musculoskeletal disorder, or substance abuse issues.  Seizures have consistently occurred during sleep. Excellent compliance with medication  He relates separate issue of day before Thanksgiving recalls using a liquid soap in the shower and felt some burning with urination afterwards.  On Thanksgiving day felt poorly in general.  No definite fever.  Has had some mild dysuria since then but no hematuria and no fevers or chills.  Overall much improved.  Has been very cognizant of good hydration since then.  Past Medical History:  Diagnosis Date   Colon polyps    Sessile Serrated adenomas   DYSPNEA 02/09/2009   RESTLESS LEGS SYNDROME 02/28/2009   SEIZURE DISORDER 02/09/2009   last seisure Sep 10, 2001   Past Surgical History:  Procedure Laterality Date   COLONOSCOPY     HERNIA REPAIR Right 02/11/09   dr. Donella Stade -[facility]   HERNIA REPAIR Left    LAPAROSCOPIC APPENDECTOMY  06/01/2011   Procedure: APPENDECTOMY LAPAROSCOPIC;  Surgeon: Shann Medal, MD;  Location: [facility];  Service: General;  Laterality: N/A;   POLYPECTOMY      reports that he has never smoked. He has never used smokeless tobacco. He reports that he does not drink alcohol and does not use drugs. family history includes Cancer in his father and mother; Diabetes Mellitus II in his father. Allergies  Allergen Reactions   Suprep [Na Sulfate-K Sulfate-Mg Sulf] Nausea And Vomiting    Hyponatremia leading to seizure   Carbamazepine     REACTION: fatigue, sleepy   Ciprofloxacin     Per pt, "thinks caused rash"   Clindamycin/Lincomycin Diarrhea   Metronidazole     REACTION: GI upset   Penicillins     REACTION: childhood, hives?   Phenytoin     REACTION: rash, leg swelling   Sulfamethoxazole Other (See Comments)   Trimethoprim Other (See Comments)   Bactrim Other (See Comments)    Caused headaches, that progressively worsened when on medication. Once off, they went away.   Phenytoin Sodium Extended Swelling and Rash    Swelling of ankles. Happened years ago per patient.    Review of Systems  Constitutional:  Negative for chills and fever.  Respiratory:  Negative for shortness of breath.   Cardiovascular:  Negative for chest pain.  Genitourinary:  Positive for dysuria. Negative for flank pain and hematuria.  Neurological:  Negative for dizziness, focal weakness, loss of consciousness and headaches.      Objective:     BP 130/70 (  BP Location: Left Arm, Patient Position: Sitting, Cuff Size: Normal)   Pulse 62   Temp 97.6 F (36.4 C) (Oral)   Ht '5\' 11"'$  (1.803 m)   Wt 144 lb 11.2 oz (65.6 kg)   SpO2 98%   BMI 20.18 kg/m  BP Readings from Last 3 Encounters:  06/20/22 130/70  04/27/22 120/70  02/06/22 122/70   Wt Readings from Last 3 Encounters:  06/20/22 144 lb 11.2 oz (65.6 kg)  04/27/22 146 lb 3.2 oz (66.3 kg)  02/06/22 148 lb (67.1 kg)      Physical Exam Vitals reviewed.  Constitutional:      Appearance: Normal appearance.  HENT:      Head: Normocephalic and atraumatic.  Cardiovascular:     Rate and Rhythm: Normal rate and regular rhythm.  Pulmonary:     Effort: Pulmonary effort is normal.     Breath sounds: Normal breath sounds. No wheezing or rales.  Neurological:     General: No focal deficit present.     Mental Status: He is alert.     Cranial Nerves: No cranial nerve deficit.     Motor: No weakness.  Psychiatric:        Mood and Affect: Mood normal.        Behavior: Behavior normal.        Thought Content: Thought content normal.        Judgment: Judgment normal.      No results found for any visits on 06/20/22.  Last CBC Lab Results  Component Value Date   WBC 3.4 (L) 10/31/2021   HGB 14.5 10/31/2021   HCT 43.0 10/31/2021   MCV 94.8 10/31/2021   MCH 32.4 07/09/2020   RDW 13.6 10/31/2021   PLT 161.0 95/63/8756   Last metabolic panel Lab Results  Component Value Date   GLUCOSE 90 10/31/2021   NA 141 10/31/2021   K 4.5 10/31/2021   CL 99 10/31/2021   CO2 32 10/31/2021   BUN 13 10/31/2021   CREATININE 0.66 10/31/2021   GFRNONAA >60 07/09/2020   CALCIUM 9.6 10/31/2021   PHOS 3.2 12/25/2018   PROT 6.6 10/31/2021   ALBUMIN 4.7 10/31/2021   BILITOT 0.7 10/31/2021   ALKPHOS 104 10/31/2021   AST 19 10/31/2021   ALT 20 10/31/2021   ANIONGAP 9 07/09/2020   Last lipids Lab Results  Component Value Date   CHOL 191 10/31/2021   HDL 107.10 10/31/2021   LDLCALC 74 10/31/2021   TRIG 46.0 10/31/2021   CHOLHDL 2 10/31/2021   Last thyroid functions Lab Results  Component Value Date   TSH 1.80 10/31/2021      The ASCVD Risk score (Arnett DK, et al., 2019) failed to calculate for the following reasons:   The valid HDL cholesterol range is 20 to 100 mg/dL    Assessment & Plan:   #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  Patient stable on phenobarbital.  Last seizure was 2020 but this occurred in association with hyponatremia related to colonoscopy prep.  Otherwise very stable.  We do not feel  this poses any significant threat to his driving at this time.  Forms will be completed.  He has been very compliant with therapy with his phenobarbital for many years  #2 dysuria.  Possibly related to soap irritation.  Check urine dipstick and culture if indicated. -Urine dipstick is completely clear. -Reassurance given.  Continue to hydrate well.   No follow-ups on file.    Carolann Littler, MD

## 2022-06-21 ENCOUNTER — Encounter: Payer: Self-pay | Admitting: Podiatry

## 2022-06-21 ENCOUNTER — Ambulatory Visit: Payer: Medicare PPO | Admitting: Podiatry

## 2022-06-21 DIAGNOSIS — D2372 Other benign neoplasm of skin of left lower limb, including hip: Secondary | ICD-10-CM

## 2022-06-21 DIAGNOSIS — M79676 Pain in unspecified toe(s): Secondary | ICD-10-CM | POA: Diagnosis not present

## 2022-06-21 DIAGNOSIS — B351 Tinea unguium: Secondary | ICD-10-CM

## 2022-06-21 DIAGNOSIS — D2371 Other benign neoplasm of skin of right lower limb, including hip: Secondary | ICD-10-CM

## 2022-06-24 NOTE — Progress Notes (Signed)
Presents today chief complaint of painful elongated toenails.  Ejective: Toenails are long thick yellow dystrophic onychomycotic.  Assessment: Pain limb secondary onychomycosis.  Plan: Debridement toenails 1 through 5 bilateral.

## 2022-06-26 DIAGNOSIS — S62615A Displaced fracture of proximal phalanx of left ring finger, initial encounter for closed fracture: Secondary | ICD-10-CM | POA: Diagnosis not present

## 2022-07-04 DIAGNOSIS — M542 Cervicalgia: Secondary | ICD-10-CM | POA: Diagnosis not present

## 2022-07-06 DIAGNOSIS — M542 Cervicalgia: Secondary | ICD-10-CM | POA: Diagnosis not present

## 2022-07-10 DIAGNOSIS — M542 Cervicalgia: Secondary | ICD-10-CM | POA: Diagnosis not present

## 2022-07-10 DIAGNOSIS — G2581 Restless legs syndrome: Secondary | ICD-10-CM | POA: Diagnosis not present

## 2022-07-10 DIAGNOSIS — M5412 Radiculopathy, cervical region: Secondary | ICD-10-CM | POA: Diagnosis not present

## 2022-07-10 DIAGNOSIS — M5136 Other intervertebral disc degeneration, lumbar region: Secondary | ICD-10-CM | POA: Diagnosis not present

## 2022-07-31 DIAGNOSIS — M542 Cervicalgia: Secondary | ICD-10-CM | POA: Diagnosis not present

## 2022-08-10 DIAGNOSIS — M5412 Radiculopathy, cervical region: Secondary | ICD-10-CM | POA: Diagnosis not present

## 2022-08-28 ENCOUNTER — Ambulatory Visit: Payer: Medicare PPO | Admitting: Podiatry

## 2022-08-28 DIAGNOSIS — D2372 Other benign neoplasm of skin of left lower limb, including hip: Secondary | ICD-10-CM

## 2022-08-28 DIAGNOSIS — D2371 Other benign neoplasm of skin of right lower limb, including hip: Secondary | ICD-10-CM

## 2022-08-28 DIAGNOSIS — B351 Tinea unguium: Secondary | ICD-10-CM

## 2022-08-28 DIAGNOSIS — M79676 Pain in unspecified toe(s): Secondary | ICD-10-CM | POA: Diagnosis not present

## 2022-08-28 NOTE — Progress Notes (Signed)
He presents today chief complaint of painful elongated toenails.  States that they have been bothering him recently but they have done really well this past time.  States that he has pinched nerve in his neck and he is seeing Sherley Bounds for neck surgery in the near future.  Objective: Pulses remain palpable considerable venous distention but no edema.  Toenails are long thick yellow dystrophic onychomycotic sharply incurvated and painful.  Assessment: Pain in limb secondary to onychomycosis.  Plan: Debridement of toenails 1 through 5 bilateral covered service secondary to pain.  Remember to ask how his neck surgery went.

## 2022-08-29 ENCOUNTER — Ambulatory Visit: Payer: Medicare PPO | Admitting: Podiatry

## 2022-08-30 DIAGNOSIS — M5412 Radiculopathy, cervical region: Secondary | ICD-10-CM | POA: Diagnosis not present

## 2022-09-10 ENCOUNTER — Ambulatory Visit (INDEPENDENT_AMBULATORY_CARE_PROVIDER_SITE_OTHER): Payer: Medicare PPO

## 2022-09-10 VITALS — Ht 71.0 in | Wt 144.0 lb

## 2022-09-10 DIAGNOSIS — Z0000 Encounter for general adult medical examination without abnormal findings: Secondary | ICD-10-CM | POA: Diagnosis not present

## 2022-09-10 NOTE — Progress Notes (Signed)
Subjective:   Nathan [name] is a 77 y.o. male who presents for Medicare Annual/Subsequent preventive examination.  Review of Systems    Virtual Visit via Telephone Note  I connected with  Nathan [name] on 09/10/22 at  9:45 AM EST by telephone and verified that I am speaking with the correct person using two identifiers.  Location: Patient: Home Provider: Office Persons participating in the virtual visit: patient/Nurse Health Advisor   I discussed the limitations, risks, security and privacy concerns of performing an evaluation and management service by telephone and the availability of in person appointments. The patient expressed understanding and agreed to proceed.  Interactive audio and video telecommunications were attempted between this nurse and patient, however failed, due to patient having technical difficulties OR patient did not have access to video capability.  We continued and completed visit with audio only.  Some vital signs may be absent or patient reported.   Criselda Peaches, LPN  Cardiac Risk Factors include: advanced age (>62mn, >>46women);male gender     Objective:    Today's Vitals   09/10/22 0939  Weight: 144 lb (65.3 kg)  Height: '5\' 11"'$  (1.803 m)   Body mass index is 20.08 kg/m.     09/10/2022    9:47 AM 11/16/2021    3:25 PM 09/07/2021    9:55 AM 02/10/2021   10:23 AM 07/09/2020    5:26 PM 02/03/2020    2:50 PM 12/25/2018    3:44 AM  Advanced Directives  Does Patient Have a Medical Advance Directive? Yes Yes Yes Yes Yes Yes No  Type of AParamedicof [place]Living will Living will;Healthcare Power of [place] will [place] of facility DNR (pink MOST or yellow form);Living will [place] will Living will;Healthcare Power of Attorney   Does patient want to make changes to medical advance directive?  No - Patient declined No - Patient declined    No - Patient declined   Copy of [place] Chart? No - copy requested  No - copy requested   No - copy requested   Would patient like information on creating a medical advance directive?       No - Patient declined    Current Medications (verified) Outpatient Encounter Medications as of 09/10/2022  Medication Sig   cholecalciferol (VITAMIN D) 1000 UNITS tablet Take 1,000 Units by mouth daily.   Cod Liver Oil OIL Take 5 mLs by mouth daily.   Coenzyme Q10 (CO Q 10) 60 MG CAPS Take 1 tablet by mouth daily.   cyanocobalamin 500 MCG tablet Take 1,000 mcg by mouth daily.   folic acid (FOLVITE) 1 MG tablet Take 1 mg by mouth daily.   Homeopathic Products (SIMILASAN DRY EYE RELIEF) SOLN Apply 1 drop to eye daily as needed. Dry Eyes   OVER THE COUNTER MEDICATION AlgeaCal   OVER THE COUNTER MEDICATION Strontium citrate   PHENObarbital (LUMINAL) 60 MG tablet TAKE ONE TABLET BY MOUTH AT BEDTIME   Probiotic Product (PROBIOTIC DAILY PO) Take by mouth daily.   vitamin C (ASCORBIC ACID) 500 MG tablet Take 500 mg by mouth 3 (three) times daily.   Vitamin D, Ergocalciferol, (DRISDOL) 1.25 MG (50000 UNIT) CAPS capsule Take 1 capsule (50,000 Units total) by mouth every 7 (seven) days.   zinc gluconate 50 MG tablet Take 50 mg by mouth daily.   No facility-administered encounter medications on file as of 09/10/2022.  Allergies (verified) Suprep [na sulfate-k sulfate-mg sulf], Carbamazepine, Ciprofloxacin, Clindamycin/lincomycin, Metronidazole, Penicillins, Phenytoin, Sulfamethoxazole, Trimethoprim, Bactrim, and Phenytoin sodium extended   History: Past Medical History:  Diagnosis Date   Colon polyps    Sessile Serrated adenomas   DYSPNEA 02/09/2009   RESTLESS LEGS SYNDROME 02/28/2009   SEIZURE DISORDER 02/09/2009   last seisure Sep 10, 2001   Past Surgical History:  Procedure Laterality Date   COLONOSCOPY     HERNIA REPAIR Right 02/11/09   dr. Donella Stade -[facility]   HERNIA REPAIR Left     LAPAROSCOPIC APPENDECTOMY  06/01/2011   Procedure: APPENDECTOMY LAPAROSCOPIC;  Surgeon: Shann Medal, MD;  Location: [facility];  Service: General;  Laterality: N/A;   POLYPECTOMY     Family History  Problem Relation Age of Onset   Cancer Mother        unknown type but thinks "male organs"   Cancer Father        multiple myeloma   Diabetes Mellitus II Father    Stroke Neg Hx        grandparent   Colon cancer Neg Hx    Esophageal cancer Neg Hx    Rectal cancer Neg Hx    Stomach cancer Neg Hx    Pancreatic cancer Neg Hx    Liver disease Neg Hx    Social History   Socioeconomic History   Marital status: Married    Spouse name: Nathan [name]   Number of children: 0   Years of education: 2 MA's    Highest education level: Not on file  Occupational History   Occupation: Retired Product manager: OTHER    Comment: Part Time: [employer]  Tobacco Use   Smoking status: Never   Smokeless tobacco: Never  Vaping Use   Vaping Use: Never used  Substance and Sexual Activity   Alcohol use: No    Alcohol/week: 0.0 standard drinks of alcohol   Drug use: No   Sexual activity: Not on file  Other Topics Concern   Not on file  Social History Narrative   Patient lives at home with spouse.   Caffeine Use: quit 1980   Lives in a one story home. No chidren.   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)   Social Determinants of Health   Financial Resource Strain: Low Risk  (09/10/2022)   Overall Financial Resource Strain (CARDIA)    Difficulty of Paying Living Expenses: Not hard at all  Food Insecurity: No Food Insecurity (09/10/2022)   Hunger Vital Sign    Worried About Running Out of Food in the Last Year: Never true    Ran Out of Food in the Last Year: Never true  Transportation Needs: No Transportation Needs (09/10/2022)   PRAPARE - Hydrologist (Medical): No    Lack of Transportation (Non-Medical): No  Physical  Activity: Sufficiently Active (09/10/2022)   Exercise Vital Sign    Days of Exercise per Week: 5 days    Minutes of Exercise per Session: 30 min  Stress: No Stress Concern Present (09/10/2022)   [place]    Feeling of Stress : Not at all  Social Connections: [place] (09/10/2022)   Social Connection and Isolation Panel [NHANES]    Frequency of Communication with Friends and Family: More than three times a week    Frequency of Social Gatherings with Friends and Family: More than three times  a week    Attends Religious Services: More than 4 times per year    Active Member of Clubs or Organizations: Yes    Attends Music therapist: More than 4 times per year    Marital Status: Married    Tobacco Counseling Counseling given: Not Answered   Clinical Intake:  Pre-visit preparation completed: No  Pain : No/denies pain     BMI - recorded: 20.08 Nutritional Risks: None Diabetes: No  How often do you need to have someone help you when you read instructions, pamphlets, or other written materials from your doctor or pharmacy?: 1 - Never  Diabetic? No  Interpreter Needed?: No  Information entered by :: Rolene Arbour LPN   Activities of Daily Living    09/10/2022    9:44 AM  In your present state of health, do you have any difficulty performing the following activities:  Hearing? 1  Comment Wears hearing aids  Vision? 0  Difficulty concentrating or making decisions? 0  Walking or climbing stairs? 0  Dressing or bathing? 0  Doing errands, shopping? 0  Preparing Food and eating ? N  Using the Toilet? N  In the past six months, have you accidently leaked urine? N  Do you have problems with loss of bowel control? N  Managing your Medications? N  Managing your Finances? N  Housekeeping or managing your Housekeeping? N    Patient Care Team: [name], MD as PCP -  General Alda Berthold, DO as Consulting Physician (Neurology)  Indicate any recent Medical Services you may have received from other than [facility] providers in the past year (date may be approximate).     Assessment:   This is a routine wellness examination for Nathan [name].  Hearing/Vision screen Hearing Screening - Comments:: Wears hearing aids Vision Screening - Comments:: Wears rx glasses - up to date with routine eye exams with  Dr Katy Fitch  Dietary issues and exercise activities discussed: Current Exercise Habits: Home exercise routine, Type of exercise: walking, Time (Minutes): 30, Frequency (Times/Week): 5, Weekly Exercise (Minutes/Week): 150, Intensity: Moderate, Exercise limited by: None identified   Goals Addressed               This Visit's Progress     Patient stated (pt-stated)        To continue to eat well and exercise.        Depression Screen    09/10/2022    9:43 AM 04/27/2022    2:09 PM 09/07/2021    9:51 AM 02/03/2020    2:54 PM 11/03/2018    9:40 AM 11/25/2017   10:09 AM 11/23/2016    9:43 AM  PHQ 2/9 Scores  PHQ - 2 Score 0 0 0 0 0 0 0  PHQ- 9 Score    0       Fall Risk    09/10/2022    9:45 AM 06/20/2022    7:04 AM 12/05/2021    2:43 PM 12/04/2021    1:04 PM 09/07/2021    9:54 AM  Fall Risk   Falls in the past year? '1 1 1 1 '$ 0  Number falls in past yr: 0 0 1 0 0  Injury with Fall? '1 1 1 1 '$ 0  Comment Fx Fingers on left hand. Followed by medical attention      Risk for fall due to : No Fall Risks;Other (Comment) No Fall Risks No Fall Risks  No Fall Risks  Risk for fall due  to: Comment Lost ballance      Follow up Falls prevention discussed Falls evaluation completed Falls evaluation completed      [place]:  Any stairs in or around the home? No  If so, are there any without handrails? No  Home free of loose throw rugs in walkways, pet beds, electrical cords, etc? Yes  Adequate lighting in your home to reduce risk of  falls? Yes   ASSISTIVE DEVICES UTILIZED TO PREVENT FALLS:  Life alert? No  Use of a cane, walker or w/c? No  Grab bars in the bathroom? Yes  Shower chair or bench in shower? Yes  Elevated toilet seat or a handicapped toilet? Yes   TIMED UP AND GO:  Was the test performed? No . Audio Visit    Cognitive Function:        09/10/2022    9:47 AM 09/07/2021    9:55 AM 02/03/2020    2:56 PM  6CIT Screen  What Year? 0 points 0 points 0 points  What month? 0 points 0 points 0 points  What time? 0 points 0 points 0 points  Count back from 20 0 points 0 points 0 points  Months in reverse 0 points 0 points 0 points  Repeat phrase 0 points 0 points 0 points  Total Score 0 points 0 points 0 points    Immunizations Immunization History  Administered Date(s) Administered   Fluad Quad(high Dose 65+) 04/20/2019, 04/20/2022   Influenza Split 04/26/2011, 04/30/2012   Influenza Whole 05/05/2010   Influenza, High Dose Seasonal PF 05/12/2015, 04/13/2016, 05/02/2017, 04/29/2018   Influenza,inj,Quad PF,6+ Mos 05/07/2013, 05/05/2014   Influenza-Unspecified 04/29/2020, 04/07/2021   MODERNA COVID-19 SARS-COV-2 PEDS BIVALENT BOOSTER 6Y-11Y 05/12/2021   Moderna Sars-Covid-2 Vaccination 08/20/2019, 09/17/2019   Pneumococcal Conjugate-13 01/18/2015   Pneumococcal Polysaccharide-23 01/22/2013   Td 01/28/2007   Tdap 01/07/2017   Zoster, Live 02/05/2011    TDAP status: Up to date  Flu Vaccine status: Up to date  Pneumococcal vaccine status: Up to date  Covid-19 vaccine status: Completed vaccines  Qualifies for Shingles Vaccine? Yes   Zostavax completed No  Shingrix Completed?: No.    Education has been provided regarding the importance of this vaccine. Patient has been advised to call insurance company to determine out of pocket expense if they have not yet received this vaccine. Advised may also receive vaccine at local pharmacy or Health Dept. Verbalized acceptance and  understanding.  Screening Tests Health Maintenance  Topic Date Due   COVID-19 Vaccine (4 - 2023-24 season) 09/26/2022 (Originally 03/16/2022)   Zoster Vaccines- Shingrix (1 of 2) 12/09/2022 (Originally 02/07/1965)   Medicare Annual Wellness ([place])  09/11/2023   COLONOSCOPY (Pts 45-19yr Insurance coverage will need to be confirmed)  02/04/2025   DTaP/Tdap/Td (3 - Td or Tdap) 01/08/2027   Pneumonia Vaccine 77 Years old  Completed   INFLUENZA VACCINE  Completed   Hepatitis C Screening  Completed   HPV VACCINES  Aged Out    Health Maintenance  There are no preventive care reminders to display for this patient.   Colorectal cancer screening: Type of screening: Colonoscopy. Completed 02/05/20. Repeat every 5 years  Lung Cancer Screening: (Low Dose CT Chest recommended if Age 76-80years, 30 pack-year currently smoking OR have quit w/in 15years.) does not qualify.     Additional Screening:  Hepatitis C Screening: does qualify; Completed 01/17/17  Vision Screening: Recommended annual ophthalmology exams for early detection of glaucoma and other disorders  of the eye. Is the patient up to date with their annual eye exam?  Yes  Who is the provider or what is the name of the office in which the patient attends annual eye exams? Dr Katy Fitch If pt is not established with a provider, would they like to be referred to a provider to establish care? No .   Dental Screening: Recommended annual dental exams for proper oral hygiene  Community Resource Referral / Chronic Care Management:  CRR required this visit?  No   CCM required this visit?  No      Plan:     I have personally reviewed and noted the following in the patient's chart:   Medical and social history Use of alcohol, tobacco or illicit drugs  Current medications and supplements including opioid prescriptions. Patient is not currently taking opioid prescriptions. Functional ability and status Nutritional status Physical  activity Advanced directives List of other physicians Hospitalizations, surgeries, and ER visits in previous 12 months Vitals Screenings to include cognitive, depression, and falls Referrals and appointments  In addition, I have reviewed and discussed with patient certain preventive protocols, quality metrics, and best practice recommendations. A written personalized care plan for preventive services as well as general preventive health recommendations were provided to patient.     Criselda Peaches, LPN   [phone]   Nurse Notes: None

## 2022-09-10 NOTE — Patient Instructions (Addendum)
Nathan [name] , Thank you for taking time to come for your Medicare Wellness Visit. I appreciate your ongoing commitment to your health goals. Please review the following plan we discussed and let me know if I can assist you in the future.   These are the goals we discussed:  Goals       Patient stated (pt-stated)      To continue to eat well and exercise.       Patient Stated (pt-stated)      I will continue to exercise for at least 3 days per  week.         This is a list of the screening recommended for you and due dates:  Health Maintenance  Topic Date Due   COVID-19 Vaccine (4 - 2023-24 season) 09/26/2022*   Zoster (Shingles) Vaccine (1 of 2) 12/09/2022*   Medicare Annual Wellness Visit  09/11/2023   Colon Cancer Screening  02/04/2025   DTaP/Tdap/Td vaccine (3 - Td or Tdap) 01/08/2027   Pneumonia Vaccine  Completed   Flu Shot  Completed   Hepatitis C Screening: USPSTF Recommendation to screen - Ages 18-79 yo.  Completed   HPV Vaccine  Aged Out  *Topic was postponed. The date shown is not the original due date.    Advanced directives: Please bring a copy of your health care power of attorney and living will to the office to be added to your chart at your convenience.   Conditions/risks identified: None  Next appointment: Follow up in one year for your annual wellness visit.   Preventive Care 31 Years and Older, Male  Preventive care refers to lifestyle choices and visits with your health care provider that can promote health and wellness. What does preventive care include? A yearly physical exam. This is also called an annual well check. Dental exams once or twice a year. Routine eye exams. Ask your health care provider how often you should have your eyes checked. Personal lifestyle choices, including: Daily care of your teeth and gums. Regular physical activity. Eating a healthy diet. Avoiding tobacco and drug use. Limiting alcohol use. Practicing safe sex. Taking  low doses of aspirin every day. Taking vitamin and mineral supplements as recommended by your health care provider. What happens during an annual well check? The services and screenings done by your health care provider during your annual well check will depend on your age, overall health, lifestyle risk factors, and family history of disease. Counseling  Your health care provider may ask you questions about your: Alcohol use. Tobacco use. Drug use. Emotional well-being. Home and relationship well-being. Sexual activity. Eating habits. History of falls. Memory and ability to understand (cognition). Work and work Statistician. Screening  You may have the following tests or measurements: Height, weight, and BMI. Blood pressure. Lipid and cholesterol levels. These may be checked every 5 years, or more frequently if you are over 68 years old. Skin check. Lung cancer screening. You may have this screening every year starting at age 56 if you have a 30-pack-year history of smoking and currently smoke or have quit within the past 15 years. Fecal occult blood test (FOBT) of the stool. You may have this test every year starting at age 28. Flexible sigmoidoscopy or colonoscopy. You may have a sigmoidoscopy every 5 years or a colonoscopy every 10 years starting at age 81. Prostate cancer screening. Recommendations will vary depending on your family history and other risks. Hepatitis C blood test. Hepatitis B blood test. Sexually  transmitted disease (STD) testing. Diabetes screening. This is done by checking your blood sugar (glucose) after you have not eaten for a while (fasting). You may have this done every 1-3 years. Abdominal aortic aneurysm (AAA) screening. You may need this if you are a current or former smoker. Osteoporosis. You may be screened starting at age 47 if you are at high risk. Talk with your health care provider about your test results, treatment options, and if necessary, the  need for more tests. Vaccines  Your health care provider may recommend certain vaccines, such as: Influenza vaccine. This is recommended every year. Tetanus, diphtheria, and acellular pertussis (Tdap, Td) vaccine. You may need a Td booster every 10 years. Zoster vaccine. You may need this after age 6. Pneumococcal 13-valent conjugate (PCV13) vaccine. One dose is recommended after age 83. Pneumococcal polysaccharide (PPSV23) vaccine. One dose is recommended after age 4. Talk to your health care provider about which screenings and vaccines you need and how often you need them. This information is not intended to replace advice given to you by your health care provider. Make sure you discuss any questions you have with your health care provider. Document Released: 07/29/2015 Document Revised: 03/21/2016 Document Reviewed: 05/03/2015 Elsevier Interactive Patient Education  2017 [place] Prevention in the Home Falls can cause injuries. They can happen to people of all ages. There are many things you can do to make your home safe and to help prevent falls. What can I do on the outside of my home? Regularly fix the edges of walkways and driveways and fix any cracks. Remove anything that might make you trip as you walk through a door, such as a raised step or threshold. Trim any bushes or trees on the path to your home. Use bright outdoor lighting. Clear any walking paths of anything that might make someone trip, such as rocks or tools. Regularly check to see if handrails are loose or broken. Make sure that both sides of any steps have handrails. Any raised decks and porches should have guardrails on the edges. Have any leaves, snow, or ice cleared regularly. Use sand or salt on walking paths during winter. Clean up any spills in your garage right away. This includes oil or grease spills. What can I do in the bathroom? Use night lights. Install grab bars by the toilet and in the tub  and shower. Do not use towel bars as grab bars. Use non-skid mats or decals in the tub or shower. If you need to sit down in the shower, use a plastic, non-slip stool. Keep the floor dry. Clean up any water that spills on the floor as soon as it happens. Remove soap buildup in the tub or shower regularly. Attach bath mats securely with double-sided non-slip rug tape. Do not have throw rugs and other things on the floor that can make you trip. What can I do in the bedroom? Use night lights. Make sure that you have a light by your bed that is easy to reach. Do not use any sheets or blankets that are too big for your bed. They should not hang down onto the floor. Have a firm chair that has side arms. You can use this for support while you get dressed. Do not have throw rugs and other things on the floor that can make you trip. What can I do in the kitchen? Clean up any spills right away. Avoid walking on wet floors. Keep items that you use  a lot in easy-to-reach places. If you need to reach something above you, use a strong step stool that has a grab bar. Keep electrical cords out of the way. Do not use floor polish or wax that makes floors slippery. If you must use wax, use non-skid floor wax. Do not have throw rugs and other things on the floor that can make you trip. What can I do with my stairs? Do not leave any items on the stairs. Make sure that there are handrails on both sides of the stairs and use them. Fix handrails that are broken or loose. Make sure that handrails are as long as the stairways. Check any carpeting to make sure that it is firmly attached to the stairs. Fix any carpet that is loose or worn. Avoid having throw rugs at the top or bottom of the stairs. If you do have throw rugs, attach them to the floor with carpet tape. Make sure that you have a light switch at the top of the stairs and the bottom of the stairs. If you do not have them, ask someone to add them for  you. What else can I do to help prevent falls? Wear shoes that: Do not have high heels. Have rubber bottoms. Are comfortable and fit you well. Are closed at the toe. Do not wear sandals. If you use a stepladder: Make sure that it is fully opened. Do not climb a closed stepladder. Make sure that both sides of the stepladder are locked into place. Ask someone to hold it for you, if possible. Clearly mark and make sure that you can see: Any grab bars or handrails. First and last steps. Where the edge of each step is. Use tools that help you move around (mobility aids) if they are needed. These include: Canes. Walkers. Scooters. Crutches. Turn on the lights when you go into a dark area. Replace any light bulbs as soon as they burn out. Set up your furniture so you have a clear path. Avoid moving your furniture around. If any of your floors are uneven, fix them. If there are any pets around you, be aware of where they are. Review your medicines with your doctor. Some medicines can make you feel dizzy. This can increase your chance of falling. Ask your doctor what other things that you can do to help prevent falls. This information is not intended to replace advice given to you by your health care provider. Make sure you discuss any questions you have with your health care provider. Document Released: 04/28/2009 Document Revised: 12/08/2015 Document Reviewed: 08/06/2014 Elsevier Interactive Patient Education  2017 [employer].

## 2022-09-14 DIAGNOSIS — M79641 Pain in right hand: Secondary | ICD-10-CM | POA: Insufficient documentation

## 2022-09-20 DIAGNOSIS — M5412 Radiculopathy, cervical region: Secondary | ICD-10-CM | POA: Diagnosis not present

## 2022-10-15 DIAGNOSIS — M79601 Pain in right arm: Secondary | ICD-10-CM | POA: Diagnosis not present

## 2022-10-25 DIAGNOSIS — M5412 Radiculopathy, cervical region: Secondary | ICD-10-CM | POA: Diagnosis not present

## 2022-11-13 ENCOUNTER — Encounter: Payer: Self-pay | Admitting: Podiatry

## 2022-11-13 ENCOUNTER — Ambulatory Visit: Payer: Medicare PPO | Admitting: Podiatry

## 2022-11-13 VITALS — BP 134/70 | HR 63

## 2022-11-13 DIAGNOSIS — M79676 Pain in unspecified toe(s): Secondary | ICD-10-CM | POA: Diagnosis not present

## 2022-11-13 DIAGNOSIS — D2371 Other benign neoplasm of skin of right lower limb, including hip: Secondary | ICD-10-CM | POA: Diagnosis not present

## 2022-11-13 DIAGNOSIS — B351 Tinea unguium: Secondary | ICD-10-CM

## 2022-11-13 DIAGNOSIS — D2372 Other benign neoplasm of skin of left lower limb, including hip: Secondary | ICD-10-CM

## 2022-11-13 NOTE — Progress Notes (Signed)
He presents today chief complaint of painful elongated toenails and calluses bilateral.  Objective: Vital signs stable oriented x 3 there is no erythema edema cellulitis drainage or odor.  Toenails are long thick yellow dystrophic onychomycotic painful palpation multiple benign skin lesions no preulcerative lesions.  Assessment: Pain in limb secondary to onychomycosis and benign skin lesions.  Plan: Debridement of benign skin lesion debridement toenails 1 through 5 bilateral no iatrogenic lesions noted.

## 2022-12-19 ENCOUNTER — Other Ambulatory Visit: Payer: Self-pay | Admitting: Neurology

## 2022-12-21 ENCOUNTER — Other Ambulatory Visit: Payer: Self-pay | Admitting: Neurology

## 2023-01-03 DIAGNOSIS — D485 Neoplasm of uncertain behavior of skin: Secondary | ICD-10-CM | POA: Diagnosis not present

## 2023-01-03 DIAGNOSIS — D0439 Carcinoma in situ of skin of other parts of face: Secondary | ICD-10-CM | POA: Diagnosis not present

## 2023-01-09 ENCOUNTER — Ambulatory Visit (INDEPENDENT_AMBULATORY_CARE_PROVIDER_SITE_OTHER): Payer: Medicare PPO | Admitting: Family Medicine

## 2023-01-09 ENCOUNTER — Encounter: Payer: Self-pay | Admitting: Family Medicine

## 2023-01-09 VITALS — BP 106/60 | HR 57 | Temp 98.0°F | Ht 70.08 in | Wt 141.0 lb

## 2023-01-09 DIAGNOSIS — M818 Other osteoporosis without current pathological fracture: Secondary | ICD-10-CM

## 2023-01-09 DIAGNOSIS — R569 Unspecified convulsions: Secondary | ICD-10-CM | POA: Diagnosis not present

## 2023-01-09 DIAGNOSIS — M5382 Other specified dorsopathies, cervical region: Secondary | ICD-10-CM

## 2023-01-09 DIAGNOSIS — Z125 Encounter for screening for malignant neoplasm of prostate: Secondary | ICD-10-CM

## 2023-01-09 DIAGNOSIS — Z0000 Encounter for general adult medical examination without abnormal findings: Secondary | ICD-10-CM

## 2023-01-09 DIAGNOSIS — Z79899 Other long term (current) drug therapy: Secondary | ICD-10-CM | POA: Diagnosis not present

## 2023-01-09 LAB — HEPATIC FUNCTION PANEL
ALT: 23 U/L (ref 0–53)
AST: 21 U/L (ref 0–37)
Albumin: 4.4 g/dL (ref 3.5–5.2)
Alkaline Phosphatase: 80 U/L (ref 39–117)
Bilirubin, Direct: 0.1 mg/dL (ref 0.0–0.3)
Total Bilirubin: 0.7 mg/dL (ref 0.2–1.2)
Total Protein: 6.3 g/dL (ref 6.0–8.3)

## 2023-01-09 LAB — LIPID PANEL
Cholesterol: 185 mg/dL (ref 0–200)
HDL: 100.3 mg/dL (ref >39.00)
LDL Cholesterol: 78 mg/dL (ref 0–99)
NonHDL: 84.47
Total CHOL/HDL Ratio: 2
Triglycerides: 33 mg/dL (ref 0.0–149.0)
VLDL: 6.6 mg/dL (ref 0.0–40.0)

## 2023-01-09 LAB — BASIC METABOLIC PANEL
BUN: 20 mg/dL (ref 6–23)
CO2: 33 mEq/L — ABNORMAL HIGH (ref 19–32)
Calcium: 9.6 mg/dL (ref 8.4–10.5)
Chloride: 99 mEq/L (ref 96–112)
Creatinine, Ser: 0.71 mg/dL (ref 0.40–1.50)
GFR: 89.49 mL/min (ref >60.00)
Glucose, Bld: 91 mg/dL (ref 70–99)
Potassium: 4.1 mEq/L (ref 3.5–5.1)
Sodium: 140 mEq/L (ref 135–145)

## 2023-01-09 LAB — VITAMIN D 25 HYDROXY (VIT D DEFICIENCY, FRACTURES): VITD: 46.47 ng/mL (ref 30.00–100.00)

## 2023-01-09 LAB — CBC WITH DIFFERENTIAL/PLATELET
Basophils Absolute: 0 10*3/uL (ref 0.0–0.1)
Basophils Relative: 1.4 % (ref 0.0–3.0)
Eosinophils Absolute: 0.1 10*3/uL (ref 0.0–0.7)
Eosinophils Relative: 3.1 % (ref 0.0–5.0)
HCT: 40.9 % (ref 39.0–52.0)
Hemoglobin: 13.8 g/dL (ref 13.0–17.0)
Lymphocytes Relative: 35.3 % (ref 12.0–46.0)
Lymphs Abs: 1.2 10*3/uL (ref 0.7–4.0)
MCHC: 33.7 g/dL (ref 30.0–36.0)
MCV: 95.4 fl (ref 78.0–100.0)
Monocytes Absolute: 0.6 10*3/uL (ref 0.1–1.0)
Monocytes Relative: 16.8 % — ABNORMAL HIGH (ref 3.0–12.0)
Neutro Abs: 1.5 10*3/uL (ref 1.4–7.7)
Neutrophils Relative %: 43.4 % (ref 43.0–77.0)
Platelets: 173 10*3/uL (ref 150.0–400.0)
RBC: 4.28 Mil/uL (ref 4.22–5.81)
RDW: 13 % (ref 11.5–15.5)
WBC: 3.4 10*3/uL — ABNORMAL LOW (ref 4.0–10.5)

## 2023-01-09 LAB — PSA, MEDICARE: PSA: 1.04 ng/ml (ref 0.10–4.00)

## 2023-01-09 NOTE — Progress Notes (Signed)
Established Patient Office Visit  Subjective   Patient ID: Nathan [name], male    DOB: [birth year]  Age: 77 y.o. MRN: [id number]  Chief Complaint  Patient presents with   Annual Exam    HPI   Mr. Ohm is seen today for physical exam.  His past medical history is reviewed.  He has history of osteoporosis treated with algae Cal supplement.  He has declined other specific osteoporosis therapies.  He has history of seizures which have been stable on phenobarbital for several years.  Also has history of hereditary and idiopathic neuropathy.  He has had problems in recent months with cervical disc disease and some radiculitis symptoms.  Currently fairly stable.  Was followed by neurosurgery.  He had some progressive problems with kyphotic posture and neck and upper body weakness.  He would like to consider possible physical therapy options.  Has not been quite as active this past year with exercise as usual.  He attributes some of this to his cervical disc issue.  Health maintenance reviewed  Health Maintenance  Topic Date Due   Zoster Vaccines- Shingrix (1 of 2) Never done   COVID-19 Vaccine (4 - 2023-24 season) 03/16/2022   INFLUENZA VACCINE  02/14/2023   Medicare Annual Wellness (AWV)  09/11/2023   Colonoscopy  02/04/2025   DTaP/Tdap/Td (3 - Td or Tdap) 01/08/2027   Pneumonia Vaccine 77+ Years old  Completed   Hepatitis C Screening  Completed   HPV VACCINES  Aged Out  Has had prior Zostavax but not Shingrix.  He declines Shingrix at this time.  Past Medical History:  Diagnosis Date   Colon polyps    Sessile Serrated adenomas   DYSPNEA 02/09/2009   RESTLESS LEGS SYNDROME 02/28/2009   SEIZURE DISORDER 02/09/2009   last seisure Sep 10, 2001   Past Surgical History:  Procedure Laterality Date   COLONOSCOPY     HERNIA REPAIR Right 02/11/09   dr. Mignon Pine -[facility]   HERNIA REPAIR Left    LAPAROSCOPIC APPENDECTOMY  06/01/2011   Procedure: APPENDECTOMY LAPAROSCOPIC;  Surgeon: Kandis Cocking, MD;  Location: [facility];  Service: General;  Laterality: N/A;   POLYPECTOMY      reports that he has never smoked. He has never used smokeless tobacco. He reports that he does not drink alcohol and does not use drugs. family history includes Cancer in his father and mother; Diabetes Mellitus II in his father. Allergies  Allergen Reactions   Suprep [Na Sulfate-K Sulfate-Mg Sulf] Nausea And Vomiting    Hyponatremia leading to seizure   Carbamazepine     REACTION: fatigue, sleepy   Ciprofloxacin     Per pt, "thinks caused rash"   Clindamycin/Lincomycin Diarrhea   Metronidazole     REACTION: GI upset   Penicillins     REACTION: childhood, hives?   Phenytoin     REACTION: rash, leg swelling   Sulfamethoxazole Other (See Comments)   Trimethoprim Other (See Comments)   Bactrim Other (See Comments)    Caused headaches, that progressively worsened when on medication. Once off, they went away.   Phenytoin Sodium Extended Swelling and Rash    Swelling of ankles. Happened years ago per patient.     Review of Systems  Constitutional:  Negative for chills, fever and malaise/fatigue.  HENT:  Negative for hearing loss.   Eyes:  Negative for blurred vision and double vision.  Respiratory:  Negative for cough and shortness of breath.   Cardiovascular:  Negative for chest  pain, palpitations and leg swelling.  Gastrointestinal:  Negative for abdominal pain, blood in stool, constipation, diarrhea, nausea and vomiting.  Genitourinary:  Negative for dysuria.  Musculoskeletal:  Positive for neck pain.  Skin:  Negative for rash.  Neurological:  Negative for dizziness, speech change, seizures, loss of consciousness and headaches.  Psychiatric/Behavioral:  Negative for depression.       Objective:     BP 106/60 (BP Location: Left Arm, Patient Position: Sitting, Cuff Size: Normal)   Pulse (!) 57   Temp 98 F (36.7 C) (Oral)   Ht 5' 10.08" (1.78 m)   Wt 141 lb (64 kg)   SpO2 94%   BMI  20.19 kg/m  BP Readings from Last 3 Encounters:  01/09/23 106/60  11/13/22 134/70  06/20/22 130/70   Wt Readings from Last 3 Encounters:  01/09/23 141 lb (64 kg)  09/10/22 144 lb (65.3 kg)  06/20/22 144 lb 11.2 oz (65.6 kg)      Physical Exam Vitals reviewed.  Constitutional:      General: He is not in acute distress.    Appearance: He is well-developed.  HENT:     Head: Normocephalic and atraumatic.  Eyes:     Pupils: Pupils are equal, round, and reactive to light.  Neck:     Thyroid: No thyromegaly.  Cardiovascular:     Rate and Rhythm: Normal rate and regular rhythm.     Heart sounds: Normal heart sounds. No murmur heard. Pulmonary:     Effort: No respiratory distress.     Breath sounds: No wheezing or rales.  Abdominal:     General: Bowel sounds are normal. There is no distension.     Palpations: Abdomen is soft. There is no mass.     Tenderness: There is no abdominal tenderness. There is no guarding or rebound.  Musculoskeletal:     Cervical back: Normal range of motion and neck supple.     Comments: He has mild pectus excavatum.  He has somewhat of a kyphotic posture with mild head tilt to the left  Lymphadenopathy:     Cervical: No cervical adenopathy.  Skin:    Findings: No rash.  Neurological:     Mental Status: He is alert and oriented to person, place, and time.     Cranial Nerves: No cranial nerve deficit.      Results for orders placed or performed in visit on 01/09/23  PSA, Medicare  Result Value Ref Range   PSA 1.04 0.10 - 4.00 ng/ml  VITAMIN D 25 Hydroxy (Vit-D Deficiency, Fractures)  Result Value Ref Range   VITD 46.47 30.00 - 100.00 ng/mL  Hepatic function panel  Result Value Ref Range   Total Bilirubin 0.7 0.2 - 1.2 mg/dL   Bilirubin, Direct 0.1 0.0 - 0.3 mg/dL   Alkaline Phosphatase 80 39 - 117 U/L   AST 21 0 - 37 U/L   ALT 23 0 - 53 U/L   Total Protein 6.3 6.0 - 8.3 g/dL   Albumin 4.4 3.5 - 5.2 g/dL  CBC with Differential/Platelet   Result Value Ref Range   WBC 3.4 (L) 4.0 - 10.5 K/uL   RBC 4.28 4.22 - 5.81 Mil/uL   Hemoglobin 13.8 13.0 - 17.0 g/dL   HCT 16.1 09.6 - 04.5 %   MCV 95.4 78.0 - 100.0 fl   MCHC 33.7 30.0 - 36.0 g/dL   RDW 40.9 81.1 - 91.4 %   Platelets 173.0 150.0 - 400.0 K/uL   Neutrophils  Relative % 43.4 43.0 - 77.0 %   Lymphocytes Relative 35.3 12.0 - 46.0 %   Monocytes Relative 16.8 (H) 3.0 - 12.0 %   Eosinophils Relative 3.1 0.0 - 5.0 %   Basophils Relative 1.4 0.0 - 3.0 %   Neutro Abs 1.5 1.4 - 7.7 K/uL   Lymphs Abs 1.2 0.7 - 4.0 K/uL   Monocytes Absolute 0.6 0.1 - 1.0 K/uL   Eosinophils Absolute 0.1 0.0 - 0.7 K/uL   Basophils Absolute 0.0 0.0 - 0.1 K/uL  Lipid panel  Result Value Ref Range   Cholesterol 185 0 - 200 mg/dL   Triglycerides 44.0 0.0 - 149.0 mg/dL   HDL 102.72 >53.66 mg/dL   VLDL 6.6 0.0 - 44.0 mg/dL   LDL Cholesterol 78 0 - 99 mg/dL   Total CHOL/HDL Ratio 2    NonHDL 84.47   Basic metabolic panel  Result Value Ref Range   Sodium 140 135 - 145 mEq/L   Potassium 4.1 3.5 - 5.1 mEq/L   Chloride 99 96 - 112 mEq/L   CO2 33 (H) 19 - 32 mEq/L   Glucose, Bld 91 70 - 99 mg/dL   BUN 20 6 - 23 mg/dL   Creatinine, Ser 3.47 0.40 - 1.50 mg/dL   GFR 42.59 >56.38 mL/min   Calcium 9.6 8.4 - 10.5 mg/dL    Last CBC Lab Results  Component Value Date   WBC 3.4 (L) 01/09/2023   HGB 13.8 01/09/2023   HCT 40.9 01/09/2023   MCV 95.4 01/09/2023   MCH 32.4 07/09/2020   RDW 13.0 01/09/2023   PLT 173.0 01/09/2023   Last metabolic panel Lab Results  Component Value Date   GLUCOSE 91 01/09/2023   NA 140 01/09/2023   K 4.1 01/09/2023   CL 99 01/09/2023   CO2 33 (H) 01/09/2023   BUN 20 01/09/2023   CREATININE 0.71 01/09/2023   GFRNONAA >60 07/09/2020   CALCIUM 9.6 01/09/2023   PHOS 3.2 12/25/2018   PROT 6.3 01/09/2023   ALBUMIN 4.4 01/09/2023   BILITOT 0.7 01/09/2023   ALKPHOS 80 01/09/2023   AST 21 01/09/2023   ALT 23 01/09/2023   ANIONGAP 9 07/09/2020   Last lipids Lab  Results  Component Value Date   CHOL 185 01/09/2023   HDL 100.30 01/09/2023   LDLCALC 78 01/09/2023   TRIG 33.0 01/09/2023   CHOLHDL 2 01/09/2023   Last thyroid functions Lab Results  Component Value Date   TSH 1.80 10/31/2021   Last vitamin D Lab Results  Component Value Date   VD25OH 46.47 01/09/2023      The ASCVD Risk score (Arnett DK, et al., 2019) failed to calculate for the following reasons:   The valid HDL cholesterol range is 20 to 100 mg/dL    Assessment & Plan:   #1 physical exam.  Health maintenance up-to-date with exception of no Shingrix vaccine.  This was discussed.  He has had prior Zostavax.  He declines Shingrix at this time. -Did encourage annual flu vaccine -Aged out of further colonoscopies -Patient requesting follow-up PSA.  We discussed risk of false positive and false negative.  Also reviewed [place] preventative task force guidelines  #2 osteoporosis.  Patient currently takes algae Cal supplement.  He does take vitamin D supplement as well.  Requesting vitamin D level.  He has declined specific osteoporosis therapy such as Fosamax in the past.  Last DEXA scan 8/23.  Consider DEXA scan in 1 year  #3 upper back and  neck weakness.  He had some progressive changes of kyphotic posture.  Patient would like to consider physical therapy trial and this will be set up  #4 history of seizures.  Patient on low-dose phenobarbital for several years.  No recent seizures.  He has pending follow-up with neurology.  Recheck phenobarbital level today   No follow-ups on file.    Evelena Peat, MD

## 2023-01-10 LAB — PHENOBARBITAL LEVEL: Phenobarbital, Serum: 7 mg/L — ABNORMAL LOW (ref 15.0–40.0)

## 2023-01-14 ENCOUNTER — Encounter: Payer: Self-pay | Admitting: Neurology

## 2023-01-14 ENCOUNTER — Ambulatory Visit: Payer: Medicare PPO | Admitting: Neurology

## 2023-01-14 VITALS — BP 127/62 | HR 84 | Ht 70.08 in | Wt 146.0 lb

## 2023-01-14 DIAGNOSIS — G629 Polyneuropathy, unspecified: Secondary | ICD-10-CM

## 2023-01-14 DIAGNOSIS — G40409 Other generalized epilepsy and epileptic syndromes, not intractable, without status epilepticus: Secondary | ICD-10-CM | POA: Diagnosis not present

## 2023-01-14 MED ORDER — PHENOBARBITAL 60 MG PO TABS: 60.0000 mg | ORAL_TABLET | Freq: Every day | ORAL | 3 refills | Status: DC

## 2023-01-14 NOTE — Progress Notes (Addendum)
Follow-up Visit   Date: 01/14/2023    Nathan [name] MRN: [id number] DOB: 10-12-1945    Nathan [name] is a 77 y.o. right-handed Caucasian male with returning to the clinic for follow-up of RLS, seizure disorder (maintained on PHB), and neuropathy.  The patient was accompanied to the clinic by wife who also provides collateral information.    IMPRESSION/PLAN:  Generalized tonic-clonic seizures, stable.  Last seizure in 2020 in the setting of bowel prep for colonoscopy and hyponatremia, prior to this it was in 2002 when medication was attempted to be weaned.   - Continue phenobarbital 60mg  daily - refilled - PHB levels has always been low, but he remains seizure-free so no need for medications changes  Restless leg syndrome, stable off medications.  Idiopathic peripheral neuropathy, stable              - Symptoms predominately manifesting with numbness in the feet  Return to clinic as needed  --------------------------------------------- History of present illness: Starting around 2014, patient developed numbness/tingling of the soles of his feet which was constant.  He would wear compression stockings for varicose veins and noticed the numbness when he would take them off in the evening and when he would shower.  Six months prior to symptom onset, he was getting varicose vein injections throughout his lower extremities, more on the right. His last treatment was in October 2014. In April 2015, his numbness suddenly worsened to involve his lower legs and knees, worse on the right.  Over the past few months, his leg numbness has improved some. He endorses mild problems with balance and has been doing home balance exercises.  He is on a very strict low-sugar, low-salt, vegetarian diet and reports that since taking vitamin B12 oral supplements, symptoms may have improved.    Patient was seen at [facility] by Dr. Marjory Lies in September 2015 for bilateral feet numbness.  EMG of the leg showed  lumbar radiculopathy.  MRI lumbar spine which showed multilevel degenerative changes, without foraminal stenosis.  Patient sought the opinion of Dr. Venetia Maxon for his lumbar radiculopathy who did not feel that symptoms were due to lumbar disease, and instead was concerning about neuropathy, and referred for second opinion.    Of note, he also has a history of seizure disorder which started at age 38 years old. 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  He was first placed on Dilantin, but due to ankle swelling he was switched to tegretol, but this caused him to be sleepy. He was transitioned to phenobarbital 30 mg which controlled his seizures. He had a second seizure in 1997 but then was seizure free until February 2003 during a period when he was taken off phenobarbital for 3-4 months prior, due to long period of seizure freedom. After his last breakthrough seizure he has been on phenobarbital 60 mg at night.   He was found to have osteoporosis and has started algea-cal.    In March 2019, he and his wife moved into a retirement community and are transitioning to this.  He continues to tolerate PHB 60mg  daily.    UPDATE 02/10/2021:   He was last seen here in 2019.  Today, he presents because of worsening RLS.  When severe, he is walking up 3 hours per night to get relief.  In December, he was started on mirapex 0.125mg  1-2 tablet, which he tried for about a month, but  it did not help his symptoms, so he stopped it.  He recalls being sleepy during the daytime.  He has joined RLS foundation and has been learning more about it from their literature.   Neuropathy is unchanged and remains in the feet.   He has one seizure in June 2020 in the setting of taking bowel prep for colonoscopy and developed hyponatremia. He is compliant with phenobarbital.  UPDATE 01/14/2023:  He is here for follow-up visit because he is requesting refills on phenobarbital.   He has not had any breakthrough seizures and is doing well.  RLS is no longer bothering him.  Neuropathy is stable and remains in the feet.  No problems with falls or imbalance.  He walks unassisted.  During the winter months, he was having right arm radicular pain and was evaluated by Dr. Yetta Barre at [facility].  He was going to get injections, however symptoms slowly improved with conservative therapies.   Medications:  Current Outpatient Medications on File Prior to Visit  Medication Sig Dispense Refill   cholecalciferol (VITAMIN D) 1000 UNITS tablet Take 1,000 Units by mouth daily.     Cod Liver Oil OIL Take 5 mLs by mouth daily.     Coenzyme Q10 (CO Q 10) 60 MG CAPS Take 1 tablet by mouth daily.     cyanocobalamin 500 MCG tablet Take 1,000 mcg by mouth daily.     folic acid (FOLVITE) 1 MG tablet Take 1 mg by mouth daily.     Homeopathic Products (SIMILASAN DRY EYE RELIEF) SOLN Apply 1 drop to eye daily as needed. Dry Eyes     OVER THE COUNTER MEDICATION AlgeaCal     OVER THE COUNTER MEDICATION Strontium citrate     PHENObarbital (LUMINAL) 60 MG tablet TAKE ONE TABLET BY MOUTH at bedtime 30 tablet 0   Probiotic Product (PROBIOTIC DAILY PO) Take by mouth daily.     vitamin C (ASCORBIC ACID) 500 MG tablet Take 500 mg by mouth 3 (three) times daily.     Vitamin D, Ergocalciferol, (DRISDOL) 1.25 MG (50000 UNIT) CAPS capsule Take 1 capsule (50,000 Units total) by mouth every 7 (seven) days. 12 capsule 0   zinc gluconate 50 MG tablet Take 50 mg by mouth daily.     No current facility-administered medications on file prior to visit.    Allergies:  Allergies  Allergen Reactions   Suprep [Na Sulfate-K Sulfate-Mg Sulf] Nausea And Vomiting    Hyponatremia leading to seizure   Carbamazepine     REACTION: fatigue, sleepy   Ciprofloxacin     Per pt, "thinks caused rash"   Clindamycin/Lincomycin Diarrhea   Metronidazole     REACTION: GI upset   Penicillins     REACTION: childhood,  hives?   Phenytoin     REACTION: rash, leg swelling   Sulfamethoxazole Other (See Comments)   Trimethoprim Other (See Comments)   Bactrim Other (See Comments)    Caused headaches, that progressively worsened when on medication. Once off, they went away.   Phenytoin Sodium Extended Swelling and Rash    Swelling of ankles. Happened years ago per patient.    Vital Signs:  There were no vitals taken for this visit.    Neurological Exam: MENTAL STATUS including orientation to time, place, person, recent and remote memory, attention span and concentration, language, and fund of knowledge is normal.  Speech is not dysarthric.  CRANIAL NERVES:   Pupils equal round and reactive to light.  Normal conjugate, extra-ocular  eye movements in all directions of gaze.  No ptosis.  Face is symmetric.  MOTOR:  Motor strength is 5/5 in all extremities, trace distal weakness in the hands and feet.  No atrophy, fasciculations or abnormal movements.  No pronator drift.  Tone is normal.    MSRs:  Reflexes are 2+/4 throughout, except absent at the ankles.  SENSORY:  Intact to vibration in the hands and knees, reduced at the ankles bilaterally.  COORDINATION/GAIT:  Gait narrow based and stable, kyphotic, unassisted.  Data: EMG performed by Dr. Marjory Lies at [facility] 04/21/2014: Abnormal study demonstrating: 1. Evidence of bilateral lumbar radiculopathies. 2. No evidence of underlying widespread polyneuropathy.   MRI lumbar spine 05/11/2014: 1. Mild multilevel lumbar disc degeneration and facet arthrosis resulting in mild bilateral lateral recess stenosis at L3-4 and L4-5 and moderate left neural foraminal stenosis at L3-4. No spinal canal stenosis. 2. Diffuse bone marrow heterogeneity, nonspecific. Considerations include anemia, smoking, and infiltrative processes (including multiple myeloma).    Thank you for allowing me to participate in patient's care.  If I can answer any additional questions, I would be  pleased to do so.    Sincerely,    Dorrine Montone K. Allena Katz, DO

## 2023-01-15 ENCOUNTER — Ambulatory Visit: Payer: Medicare PPO | Admitting: Podiatry

## 2023-01-15 ENCOUNTER — Encounter: Payer: Self-pay | Admitting: Family Medicine

## 2023-01-15 ENCOUNTER — Encounter: Payer: Self-pay | Admitting: Podiatry

## 2023-01-15 DIAGNOSIS — B351 Tinea unguium: Secondary | ICD-10-CM

## 2023-01-15 DIAGNOSIS — M79676 Pain in unspecified toe(s): Secondary | ICD-10-CM | POA: Diagnosis not present

## 2023-01-15 DIAGNOSIS — D2371 Other benign neoplasm of skin of right lower limb, including hip: Secondary | ICD-10-CM

## 2023-01-15 DIAGNOSIS — D2372 Other benign neoplasm of skin of left lower limb, including hip: Secondary | ICD-10-CM | POA: Diagnosis not present

## 2023-01-15 NOTE — Progress Notes (Signed)
He presents today chief complaint of painful elongated toenails 1 through 5 bilaterally.  Objective: Vital signs stable alert oriented x 3.  Pulses are palpable.  There is no erythema cellulitis drainage or odor.  Toenails are long thick yellow dystrophic onychomycotic.  Assessment: Pain in limb Secondary to onychomycosis.  Plan: Debridement of toenails 1 through 5 bilateral

## 2023-01-21 ENCOUNTER — Other Ambulatory Visit: Payer: Self-pay

## 2023-01-21 ENCOUNTER — Ambulatory Visit: Payer: Medicare PPO | Attending: Family Medicine | Admitting: Physical Therapy

## 2023-01-21 ENCOUNTER — Encounter: Payer: Self-pay | Admitting: Physical Therapy

## 2023-01-21 DIAGNOSIS — R2681 Unsteadiness on feet: Secondary | ICD-10-CM | POA: Insufficient documentation

## 2023-01-21 DIAGNOSIS — R293 Abnormal posture: Secondary | ICD-10-CM | POA: Insufficient documentation

## 2023-01-21 DIAGNOSIS — M5382 Other specified dorsopathies, cervical region: Secondary | ICD-10-CM | POA: Diagnosis not present

## 2023-01-21 DIAGNOSIS — M6281 Muscle weakness (generalized): Secondary | ICD-10-CM | POA: Insufficient documentation

## 2023-01-21 NOTE — Therapy (Addendum)
OUTPATIENT PHYSICAL THERAPY NEURO EVALUATION   Patient Name: Nathan [name] MRN: [id number] DOB:08-May-1946, 77 y.o., male Today's Date: 01/21/2023   PCP: Kristian Covey, MD REFERRING PROVIDER: Kristian Covey, MD   END OF SESSION:  PT End of Session - 01/21/23 0854     Visit Number 1    Number of Visits 9    Date for PT Re-Evaluation 02/22/23    Authorization Type [employer]    PT Start Time 0804    PT Stop Time 0843    PT Time Calculation (min) 39 min    Activity Tolerance Patient tolerated treatment well    Behavior During Therapy [facility] for tasks assessed/performed             Past Medical History:  Diagnosis Date   Colon polyps    Sessile Serrated adenomas   DYSPNEA 02/09/2009   RESTLESS LEGS SYNDROME 02/28/2009   SEIZURE DISORDER 02/09/2009   last seisure Sep 10, 2001   Past Surgical History:  Procedure Laterality Date   COLONOSCOPY     HERNIA REPAIR Right 02/11/09   dr. Mignon [facility]   HERNIA REPAIR Left    LAPAROSCOPIC APPENDECTOMY  06/01/2011   Procedure: APPENDECTOMY LAPAROSCOPIC;  Surgeon: Kandis Cocking, MD;  Location: [facility];  Service: General;  Laterality: N/A;   POLYPECTOMY     Patient Active Problem List   Diagnosis Date Noted   Pain in right hand 09/14/2022   Right knee pain 12/06/2021   Intractable right heel pain 01/03/2021   Hereditary and idiopathic neuropathy 11/17/2019   Other intervertebral disc degeneration, lumbar region 11/17/2019   Seizures (HCC) 12/25/2018   Osteoporosis 12/07/2016   Adverse effect of anticonvulsants 12/07/2016   Numbness in feet 04/28/2014   Inguinal hernia, left 04/02/2013   B12 deficiency 07/02/2012   H/O adenomatous polyp of colon 07/02/2012   Intra-abdominal fluid collection 06/18/2011   Fluid overload 06/06/2011   Ileus, postoperative (HCC) 06/06/2011   Appendicitis with abscess, appendectomy 06/01/2011. 06/01/2011   RESTLESS LEGS SYNDROME 02/28/2009   History of seizure 02/09/2009   DYSPNEA  02/09/2009    ONSET DATE: 01/09/2023 (MD referral)  REFERRING DIAG: M53.82 (ICD-10-CM) - Neck muscle weakness   THERAPY DIAG:  Abnormal posture  Muscle weakness (generalized)  Unsteadiness on feet  Rationale for Evaluation and Treatment: Rehabilitation  SUBJECTIVE:                                                                                                                                                                                             SUBJECTIVE STATEMENT: Neck pain began in December 2023.  MRI  done and showed pinched nerve and had some pain going down into the arm.  Was supposed to have injection for neck and nerve conduction study done.  The pain in neck got better so I never got the neck injection.  Had been to therapy at [facility] earlier, and all imaging has been through the doctors there.  I have some sensation issues in 4th/5th fingers.  Was wondering if PT would help neck weakness and posture. Pt accompanied by: self  PERTINENT HISTORY: osteoporosis, seizures (controlled by meds), neuropathy, RLS  PAIN:  Are you having pain? No   PRECAUTIONS: Other: hx of nerve impingement in neck  WEIGHT BEARING RESTRICTIONS: No  FALLS: Has patient fallen in last 6 months? No (has hx of 3 falls, but none in the past year)  LIVING ENVIRONMENT: Lives with: lives with their spouse Lives in: House/apartment   PLOF: IndependentTrying to gradually increase to dumbbell weights for biceps/triceps curls.  Enjoys walking in the neighborhood and working on the computer  PATIENT GOALS: To help strengthen neck and hold head up better  OBJECTIVE:   DIAGNOSTIC FINDINGS: Hx of MRI for cervical spine (through [facility]-no specific records available)  COGNITION: Overall cognitive status: Within functional limits for tasks assessed   SENSATION: Occasional numbness in 4th/5th fingers in R hand   POSTURE: forward head, increased thoracic kyphosis, and posterior pelvic tilt  in sitting In standing:  increased anterior pelvic tilt/increased lordosis; scoliosis; R hip lower than L  Cervical Spine ROM:  Holds head in 40 degrees flexion; with cues improves to 25 degrees  Flexion     Extension  -25  Lateral Flexion Limitation R > L  Rotation R  50 degrees  Rotation L  48 degrees   Upper Extremity ROM WFL A/ROM  UPPER EXTREMITY STRENGTH:  R    L Shoulder flexion     4+    4+ Shoulder Extension Shoulder Abduction     4+    4+ Elbow Flexion     5    5  Elbow Extension     5    5  No pain with resisted motions   TRANSFERS: Assistive device utilized: None  Sit to stand: Modified independence Stand to sit: Modified independence   FUNCTIONAL TESTS:  5 times sit to stand: 14.59 sec  M-CTSIB  Condition 1: Firm Surface, EO 30 Sec, Normal Sway  Condition 2: Firm Surface, EC 30 Sec, Mild Sway  Condition 3: Foam Surface, EO 30 Sec, Mild Sway  Condition 4: Foam Surface, EC 30 Sec, Moderate Sway     TODAY'S TREATMENT:                                                                                                                              DATE: 01/21/2023    PATIENT EDUCATION: Education details: PT eval results, POC-posture and positioning Person educated: Patient Education method: Explanation Education comprehension: verbalized understanding  HOME EXERCISE  PROGRAM: Not yet initiated  GOALS: Goals reviewed with patient? Yes  SHORT TERM GOALS: = LTGs   LONG TERM GOALS: Target date: 02/22/2023  Pt will be independent with HEP for improved flexibility, strength, posture. Baseline:  Goal status: INITIAL  2.  Pt will demo improved posture with improved resting neck posture to neutral. Baseline: holds in 25 degrees flexion at best Goal status: INITIAL  3.  Pt will improve 5x sit to stand to <12 sec for improved strength, balance. Baseline: 14.59 sec Goal status: INITIAL  4.  Pt will report no neck, arm pain with functional  activities. Baseline: hx of radiating pain in R arm Goal status: INITIAL   ASSESSMENT:  CLINICAL IMPRESSION: Patient is a 77 y.o. male who was seen today for physical therapy evaluation and treatment for neck weakness.  He has hx of neck radiculopathy and saw [facility] for physical therapy in October 2023.  Per notes, there is significant DDD C5-C6, C6-7; no MRI or imaging is available on pt's chart.  He is no longer having neck pain or radicular symptoms in RUE; he is more concerned at this point about significant forward head posture.  He presents with abnormal posture-forward head, rounded shoulders, kyphotic posture, decreased neck ROM and flexibility, decreased functional lower extremity strength, decreased balance/hx of falls.  He would benefit from skilled PT to address the above stated deficits for improved functional mobility and posture with daily activities.   OBJECTIVE IMPAIRMENTS: decreased balance, decreased ROM, decreased strength, impaired flexibility, and postural dysfunction.   ACTIVITY LIMITATIONS: sitting, standing, transfers, and reach over head  PARTICIPATION LIMITATIONS: community activity and exercise, computer work/activities  PERSONAL FACTORS: 3+ comorbidities: see above  are also affecting patient's functional outcome.   REHAB POTENTIAL: Good  CLINICAL DECISION MAKING: Stable/uncomplicated  EVALUATION COMPLEXITY: Low  PLAN:  PT FREQUENCY: 1-2x/week   PT DURATION: other: 5 weeks, including eval week  PLANNED INTERVENTIONS: Therapeutic exercises, Therapeutic activity, Neuromuscular re-education, Balance training, Gait training, Patient/Family education, Self Care, Dry Needling, Moist heat, and Manual therapy  PLAN FOR NEXT SESSION: Initiate HEP-pelvic tilts, supine neck and shoulder retraction, stretch and strengthening for improved posture. Pt to bring in imaging from [facility] (or we need to ask for permission to obtain)   Gean Maidens.,  PT 01/21/2023, 8:55 AM  [facility] at [facility] [address] [place], [address] [place], [place] [address] Phone # [phone] Fax # [phone]    Referring diagnosis? M53.82 (ICD-10-CM) - Neck muscle weakness Treatment diagnosis? (if different than referring diagnosis) Abnormal posture  Muscle weakness (generalized)  Unsteadiness on feet What was this (referring dx) caused by? []  Surgery []  Fall [x]  Ongoing issue []  Arthritis []  Other: ____________  Laterality: []  Rt []  Lt [x]  Both  Check all possible CPT codes:  *CHOOSE 10 OR LESS*    [x]  52841 (Therapeutic Exercise)  []  92507 (SLP Treatment)  [x]  97112 (Neuro Re-ed)   []  92526 (Swallowing Treatment)   [x]  97116 (Gait Training)   []  [id number] (Cognitive Training, 1st 15 minutes) [x]  97140 (Manual Therapy)   []  97130 (Cognitive Training, each add'l 15 minutes)  []  97164 (Re-evaluation)                              [x]  Other, List CPT Code ____________  [x]  97530 (Therapeutic Activities)   -dry needling        -moist heat  [x]  97535 (Self  Care)   []  All codes above (97110 - 97535)  []  97012 (Mechanical Traction)  []  16109 (E-stim Unattended)  []  97032 (E-stim manual)  []  97033 (Ionto)  []  97035 (Ultrasound) []  97750 (Physical Performance Training) []  [id number] (Aquatic Therapy) []  97016 (Vasopneumatic Device) []  [id number] (Paraffin) []  97034 (Contrast Bath) []  97597 (Wound Care 1st 20 sq cm) []  97598 (Wound Care each add'l 20 sq cm) []  97760 (Orthotic Fabrication, Fitting, Training Initial) []  [id number] (Prosthetic Management and Training Initial) []  [id number] (Orthotic or Prosthetic Training/ Modification Subsequent)

## 2023-01-24 ENCOUNTER — Ambulatory Visit: Payer: Medicare PPO

## 2023-01-24 DIAGNOSIS — R2681 Unsteadiness on feet: Secondary | ICD-10-CM | POA: Diagnosis not present

## 2023-01-24 DIAGNOSIS — R293 Abnormal posture: Secondary | ICD-10-CM

## 2023-01-24 DIAGNOSIS — M6281 Muscle weakness (generalized): Secondary | ICD-10-CM

## 2023-01-24 DIAGNOSIS — M5382 Other specified dorsopathies, cervical region: Secondary | ICD-10-CM | POA: Diagnosis not present

## 2023-01-24 NOTE — Therapy (Signed)
OUTPATIENT PHYSICAL THERAPY NEURO EVALUATION   Patient Name: Nathan [name] MRN: [id number] DOB:07/15/1946, 77 y.o., male Today's Date: 01/24/2023   PCP: Kristian Covey, MD REFERRING PROVIDER: Kristian Covey, MD   END OF SESSION:  PT End of Session - 01/24/23 0845     Visit Number 2    Number of Visits 9    Date for PT Re-Evaluation 02/22/23    Authorization Type [employer]    PT Start Time 0845    PT Stop Time 0930    PT Time Calculation (min) 45 min    Activity Tolerance Patient tolerated treatment well    Behavior During Therapy [facility] for tasks assessed/performed             Past Medical History:  Diagnosis Date   Colon polyps    Sessile Serrated adenomas   DYSPNEA 02/09/2009   RESTLESS LEGS SYNDROME 02/28/2009   SEIZURE DISORDER 02/09/2009   last seisure Sep 10, 2001   Past Surgical History:  Procedure Laterality Date   COLONOSCOPY     HERNIA REPAIR Right 02/11/09   dr. Mignon Pine -[facility]   HERNIA REPAIR Left    LAPAROSCOPIC APPENDECTOMY  06/01/2011   Procedure: APPENDECTOMY LAPAROSCOPIC;  Surgeon: Kandis Cocking, MD;  Location: [facility];  Service: General;  Laterality: N/A;   POLYPECTOMY     Patient Active Problem List   Diagnosis Date Noted   Pain in right hand 09/14/2022   Right knee pain 12/06/2021   Intractable right heel pain 01/03/2021   Hereditary and idiopathic neuropathy 11/17/2019   Other intervertebral disc degeneration, lumbar region 11/17/2019   Seizures (HCC) 12/25/2018   Osteoporosis 12/07/2016   Adverse effect of anticonvulsants 12/07/2016   Numbness in feet 04/28/2014   Inguinal hernia, left 04/02/2013   B12 deficiency 07/02/2012   H/O adenomatous polyp of colon 07/02/2012   Intra-abdominal fluid collection 06/18/2011   Fluid overload 06/06/2011   Ileus, postoperative (HCC) 06/06/2011   Appendicitis with abscess, appendectomy 06/01/2011. 06/01/2011   RESTLESS LEGS SYNDROME 02/28/2009   History of seizure 02/09/2009   DYSPNEA  02/09/2009    ONSET DATE: 01/09/2023 (MD referral)  REFERRING DIAG: M53.82 (ICD-10-CM) - Neck muscle weakness   THERAPY DIAG:  Abnormal posture  Muscle weakness (generalized)  Unsteadiness on feet  Rationale for Evaluation and Treatment: Rehabilitation  SUBJECTIVE:                                                                                                                                                                                             SUBJECTIVE STATEMENT: Not feeling too bad, the pinched nerve rarely  radiates into the arm  Pt accompanied by: self  PERTINENT HISTORY: osteoporosis, seizures (controlled by meds), neuropathy, RLS  PAIN:  Are you having pain? No   PRECAUTIONS: Other: hx of nerve impingement in neck  WEIGHT BEARING RESTRICTIONS: No  FALLS: Has patient fallen in last 6 months? No (has hx of 3 falls, but none in the past year)  LIVING ENVIRONMENT: Lives with: lives with their spouse Lives in: House/apartment   PLOF: IndependentTrying to gradually increase to dumbbell weights for biceps/triceps curls.  Enjoys walking in the neighborhood and working on the computer  PATIENT GOALS: To help strengthen neck and hold head up better  OBJECTIVE:   TODAY'S TREATMENT: 01/24/23 Activity Comments  Supine neck ext iso 2x10 3 sec  Supine chin retraction 1x10 3 sec, with finger overpressure  Supine pelvic tilts 10x   Push-up plank on countertop W/ chin retraction 3x30 sec  Suitcase/farmer's carry         PATIENT EDUCATION: Education details: PT eval results, POC-posture and positioning Person educated: Patient Education method: Explanation Education comprehension: verbalized understanding  HOME EXERCISE PROGRAM: Access Code: [email or web address] URL: [email or web address] Date: 01/24/2023 Prepared by: Shary Decamp  Exercises - Supine Isometric Neck Extension  - 1 x daily - 7 x weekly - 1-3 sets - 10 reps - 2 sec hold - Supine Passive  Cervical Retraction  - 1 x daily - 7 x weekly - 3 sets - 10 reps - 2 sec hold - Supine Posterior Pelvic Tilt  - 1 x daily - 7 x weekly - 3 sets - 10 reps - Kettlebell Suitcase Carry  - 1 x daily - 7 x weekly - 3 sets - 10 reps - Farmer's Walk  - 1 x daily - 7 x weekly - 3 sets - 10 reps - Push-Up on Counter  - 1 x daily - 7 x weekly - 3-5 sets - 30 sec hold DIAGNOSTIC FINDINGS: Hx of MRI for cervical spine (through [facility]-no specific records available)  COGNITION: Overall cognitive status: Within functional limits for tasks assessed   SENSATION: Occasional numbness in 4th/5th fingers in R hand   POSTURE: forward head, increased thoracic kyphosis, and posterior pelvic tilt in sitting In standing:  increased anterior pelvic tilt/increased lordosis; scoliosis; R hip lower than L  Cervical Spine ROM:  Holds head in 40 degrees flexion; with cues improves to 25 degrees  Flexion     Extension  -25  Lateral Flexion Limitation R > L  Rotation R  50 degrees  Rotation L  48 degrees   Upper Extremity ROM WFL A/ROM  UPPER EXTREMITY STRENGTH:  R    L Shoulder flexion     4+    4+ Shoulder Extension Shoulder Abduction     4+    4+ Elbow Flexion     5    5  Elbow Extension     5    5  No pain with resisted motions   TRANSFERS: Assistive device utilized: None  Sit to stand: Modified independence Stand to sit: Modified independence   FUNCTIONAL TESTS:  5 times sit to stand: 14.59 sec  M-CTSIB  Condition 1: Firm Surface, EO 30 Sec, Normal Sway  Condition 2: Firm Surface, EC 30 Sec, Mild Sway  Condition 3: Foam Surface, EO 30 Sec, Mild Sway  Condition 4: Foam Surface, EC 30 Sec, Moderate Sway        GOALS: Goals reviewed with patient? Yes  SHORT TERM GOALS: = LTGs  LONG TERM GOALS: Target date: 02/22/2023  Pt will be independent with HEP for improved flexibility, strength, posture. Baseline:  Goal status: INITIAL  2.  Pt will demo improved posture with improved  resting neck posture to neutral. Baseline: holds in 25 degrees flexion at best Goal status: INITIAL  3.  Pt will improve 5x sit to stand to <12 sec for improved strength, balance. Baseline: 14.59 sec Goal status: INITIAL  4.  Pt will report no neck, arm pain with functional activities. Baseline: hx of radiating pain in R arm Goal status: INITIAL   ASSESSMENT:  CLINICAL IMPRESSION:  Training in postural stability exercises with techniques to improve biofeedback and self monitoring. Implemented additional activities for posture, strength and staility.  Good tolerance to session without issue. Continued sessions to advance POC details  OBJECTIVE IMPAIRMENTS: decreased balance, decreased ROM, decreased strength, impaired flexibility, and postural dysfunction.   ACTIVITY LIMITATIONS: sitting, standing, transfers, and reach over head  PARTICIPATION LIMITATIONS: community activity and exercise, computer work/activities  PERSONAL FACTORS: 3+ comorbidities: see above  are also affecting patient's functional outcome.   REHAB POTENTIAL: Good  CLINICAL DECISION MAKING: Stable/uncomplicated  EVALUATION COMPLEXITY: Low  PLAN:  PT FREQUENCY: 1-2x/week   PT DURATION: other: 5 weeks, including eval week  PLANNED INTERVENTIONS: Therapeutic exercises, Therapeutic activity, Neuromuscular re-education, Balance training, Gait training, Patient/Family education, Self Care, Dry Needling, Moist heat, and Manual therapy  PLAN FOR NEXT SESSION: HEP review   Dion Body, PT 01/24/2023, 8:46 AM  [facility] at [facility] [address] [place], [place] [address] Phone # [phone] Fax # [phone]

## 2023-01-29 ENCOUNTER — Encounter: Payer: Self-pay | Admitting: Physical Therapy

## 2023-01-29 ENCOUNTER — Ambulatory Visit: Payer: Medicare PPO | Admitting: Physical Therapy

## 2023-01-29 DIAGNOSIS — R2681 Unsteadiness on feet: Secondary | ICD-10-CM | POA: Diagnosis not present

## 2023-01-29 DIAGNOSIS — R293 Abnormal posture: Secondary | ICD-10-CM

## 2023-01-29 DIAGNOSIS — M6281 Muscle weakness (generalized): Secondary | ICD-10-CM

## 2023-01-29 DIAGNOSIS — M5382 Other specified dorsopathies, cervical region: Secondary | ICD-10-CM | POA: Diagnosis not present

## 2023-01-29 NOTE — Therapy (Signed)
OUTPATIENT PHYSICAL THERAPY NEURO TREATMENT   Patient Name: Nathan [name] MRN: [id number] DOB:[birth year], 77 y.o., male Today's Date: 01/29/2023   PCP: Kristian Covey, MD REFERRING PROVIDER: Kristian Covey, MD   END OF SESSION:  PT End of Session - 01/29/23 0810     Visit Number 3    Number of Visits 9    Date for PT Re-Evaluation 02/22/23    Authorization Type [employer]    Authorization Time Period 7-02/2023-02/22/2023    Authorization - Visit Number 3    Authorization - Number of Visits 8    PT Start Time 0808    PT Stop Time 0846    PT Time Calculation (min) 38 min    Activity Tolerance Patient tolerated treatment well    Behavior During Therapy [facility] for tasks assessed/performed              Past Medical History:  Diagnosis Date   Colon polyps    Sessile Serrated adenomas   DYSPNEA 02/09/2009   RESTLESS LEGS SYNDROME 02/28/2009   SEIZURE DISORDER 02/09/2009   last seisure Sep 10, 2001   Past Surgical History:  Procedure Laterality Date   COLONOSCOPY     HERNIA REPAIR Right 02/11/09   dr. 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 06/01/2011   RESTLESS LEGS SYNDROME 02/28/2009   History of seizure 02/09/2009   DYSPNEA 02/09/2009    ONSET DATE: 01/09/2023 (MD referral)  REFERRING DIAG: M53.82 (ICD-10-CM) - Neck muscle weakness   THERAPY DIAG:  Abnormal posture  Muscle weakness (generalized)  Unsteadiness on feet  Rationale for Evaluation and Treatment: Rehabilitation  SUBJECTIVE:  SUBJECTIVE STATEMENT: Doing the exercises, working on increasing the reps. Pt accompanied by: self  PERTINENT HISTORY: osteoporosis, seizures (controlled by meds), neuropathy, RLS  PAIN:  Are you having pain? No   PRECAUTIONS: Other: hx of nerve impingement in neck  WEIGHT BEARING RESTRICTIONS: No  FALLS: Has patient fallen in last 6 months? No (has hx of 3 falls, but none in the past year)  LIVING ENVIRONMENT: Lives with: lives with their spouse Lives in: House/apartment   PLOF: IndependentTrying to gradually increase to dumbbell weights for biceps/triceps curls.  Enjoys walking in the neighborhood and working on the computer  PATIENT GOALS: To help strengthen neck and hold head up better  OBJECTIVE:    TODAY'S TREATMENT: 01/29/2023 Activity Comments  Supine Isometric Neck Extension  x 10   Supine Passive Cervical Retraction  - x 10  -Supine Posterior Pelvic Tilt  x 10  - Kettlebell Suitcase Carry /  Farmer's Walk  20 ft x 2 each  - Push-Up on Counter  - 10 reps Review of HEP-reminder cues for technique, especially with posterior pelvic tilts (pt has been doing anterior pelvic tilts-instructed today in posterior pelvic tilts  Supine shoulder retraction, 3 x 5 reps   Forward lean to upright posture, focus on neck retraction, 8 reps   Supine horizontal shoulder adduction, 10 reps, then 5  reps, red theraband Cues for eccentric control  Seated scapular retraction, 5# R and L, 10 reps Performed together  Standing scapular rows, 5#, 10 reps each arm Shoulder extension 5# x 10 reps each arm Performed separately      PATIENT EDUCATION: Education details: Updates to HEP Person educated: Patient Education method: Explanation Education comprehension: verbalized understanding  HOME EXERCISE PROGRAM:  Access Code: [email or web address] URL: [email or web address] Date: 01/29/2023 Prepared by: [facility] - Outpatient  Rehab - [facility]  Exercises - Supine Isometric Neck Extension  - 1 x daily - 7 x weekly - 1-3 sets - 10 reps - 2 sec hold - Supine Passive Cervical Retraction  - 1 x daily - 7 x weekly - 3 sets - 10 reps - 2 sec hold - Supine Posterior Pelvic Tilt  - 1 x daily - 7 x weekly - 3 sets - 10 reps - Kettlebell Suitcase Carry  - 1 x daily - 7 x weekly - 3 sets - 10 reps - Farmer's Walk  - 1 x daily - 7 x weekly - 3 sets - 10 reps - Push-Up on Counter  - 1 x daily - 7 x weekly - 3-5 sets - 30 sec hold - Supine Shoulder Horizontal Adduction Over Table Edge  - 1 x daily - 5 x weekly - 1-3 sets - 10 reps ---------------------------------------------------------------------------------- Objective measures below taken at initial eval:  DIAGNOSTIC FINDINGS: Hx of MRI for cervical spine (through [facility]-no specific records available)  COGNITION: Overall cognitive status: Within functional limits for tasks assessed   SENSATION: Occasional numbness in 4th/5th fingers in R hand   POSTURE: forward head, increased thoracic kyphosis, and posterior pelvic tilt in sitting In standing:  increased anterior pelvic tilt/increased lordosis; scoliosis; R hip lower than L  Cervical Spine ROM:  Holds head in 40 degrees flexion; with cues improves to 25 degrees  Flexion     Extension  -25  Lateral Flexion Limitation R > L  Rotation R  50 degrees  Rotation L  48  degrees   Upper Extremity ROM WFL A/ROM  UPPER EXTREMITY STRENGTH:  R    L  Shoulder flexion     4+    4+ Shoulder Extension Shoulder Abduction     4+    4+ Elbow Flexion     5    5  Elbow Extension     5    5  No pain with resisted motions   TRANSFERS: Assistive device utilized: None  Sit to stand: Modified independence Stand to sit: Modified independence   FUNCTIONAL TESTS:  5 times sit to stand: 14.59 sec  M-CTSIB  Condition 1: Firm Surface, EO 30 Sec, Normal Sway  Condition 2: Firm Surface, EC 30 Sec, Mild Sway  Condition 3: Foam Surface, EO 30 Sec, Mild Sway  Condition 4: Foam Surface, EC 30 Sec, Moderate Sway        GOALS: Goals reviewed with patient? Yes  SHORT TERM GOALS: = LTGs   LONG TERM GOALS: Target date: 02/22/2023  Pt will be independent with HEP for improved flexibility, strength, posture. Baseline:  Goal status: INITIAL  2.  Pt will demo improved posture with improved resting neck posture to neutral. Baseline: holds in 25 degrees flexion at best Goal status: INITIAL  3.  Pt will improve 5x sit to stand to <12 sec for improved strength, balance. Baseline: 14.59 sec Goal status: INITIAL  4.  Pt will report no neck, arm pain with functional activities. Baseline: hx of radiating pain in R arm Goal status: INITIAL   ASSESSMENT:  CLINICAL IMPRESSION:  Reviewed HEP provided last visit, with pt return demo overall understanding.  He does need extra cues for technique with posterior pelvic tilt in supine. Overall, in comparison to eval, pt is able to isolate cervical motions for retraction with less compensation through lower trunk, in sitting positions.  He will continue to benefit from skilled PT towards goals.  No c/o pain during session today.    OBJECTIVE IMPAIRMENTS: decreased balance, decreased ROM, decreased strength, impaired flexibility, and postural dysfunction.   ACTIVITY LIMITATIONS: sitting, standing, transfers, and reach over  head  PARTICIPATION LIMITATIONS: community activity and exercise, computer work/activities  PERSONAL FACTORS: 3+ comorbidities: see above  are also affecting patient's functional outcome.   REHAB POTENTIAL: Good  CLINICAL DECISION MAKING: Stable/uncomplicated  EVALUATION COMPLEXITY: Low  PLAN:  PT FREQUENCY: 1-2x/week   PT DURATION: other: 5 weeks, including eval week  PLANNED INTERVENTIONS: Therapeutic exercises, Therapeutic activity, Neuromuscular re-education, Balance training, Gait training, Patient/Family education, Self Care, Dry Needling, Moist heat, and Manual therapy  PLAN FOR NEXT SESSION: HEP review and progression.  Continue neck retraction and postural strenghtening, abdominal activation   Tylia Ewell W., PT 01/29/2023, 1:52 PM  [facility] at [facility] [address] [place], [address] [place], [place] [address] Phone # [phone] Fax # [phone]

## 2023-01-31 ENCOUNTER — Ambulatory Visit: Payer: Medicare PPO

## 2023-01-31 ENCOUNTER — Ambulatory Visit: Payer: Medicare PPO | Admitting: Podiatry

## 2023-01-31 DIAGNOSIS — R293 Abnormal posture: Secondary | ICD-10-CM | POA: Diagnosis not present

## 2023-01-31 DIAGNOSIS — M6281 Muscle weakness (generalized): Secondary | ICD-10-CM

## 2023-01-31 DIAGNOSIS — M5382 Other specified dorsopathies, cervical region: Secondary | ICD-10-CM | POA: Diagnosis not present

## 2023-01-31 DIAGNOSIS — R2681 Unsteadiness on feet: Secondary | ICD-10-CM

## 2023-01-31 NOTE — Therapy (Signed)
OUTPATIENT PHYSICAL THERAPY NEURO TREATMENT   Patient Name: Nathan [name] MRN: [id number] DOB:05/15/1946, 77 y.o., male Today's Date: 01/31/2023   PCP: Kristian Covey, MD REFERRING PROVIDER: Kristian Covey, MD   END OF SESSION:  PT End of Session - 01/31/23 0758     Visit Number 4    Number of Visits 9    Date for PT Re-Evaluation 02/22/23    Authorization Type [employer]    Authorization Time Period 7-02/2023-02/22/2023    Authorization - Visit Number 4    Authorization - Number of Visits 8    PT Start Time 0800    PT Stop Time 0845    PT Time Calculation (min) 45 min    Activity Tolerance Patient tolerated treatment well    Behavior During Therapy [facility] for tasks assessed/performed              Past Medical History:  Diagnosis Date   Colon polyps    Sessile Serrated adenomas   DYSPNEA 02/09/2009   RESTLESS LEGS SYNDROME 02/28/2009   SEIZURE DISORDER 02/09/2009   last seisure Sep 10, 2001   Past Surgical History:  Procedure Laterality Date   COLONOSCOPY     HERNIA REPAIR Right 02/11/09   dr. Mignon Pine -[facility]   HERNIA REPAIR Left    LAPAROSCOPIC APPENDECTOMY  06/01/2011   Procedure: APPENDECTOMY LAPAROSCOPIC;  Surgeon: Kandis Cocking, MD;  Location: [facility];  Service: General;  Laterality: N/A;   POLYPECTOMY     Patient Active Problem List   Diagnosis Date Noted   Pain in right hand 09/14/2022   Right knee pain 12/06/2021   Intractable right heel pain 01/03/2021   Hereditary and idiopathic neuropathy 11/17/2019   Other intervertebral disc degeneration, lumbar region 11/17/2019   Seizures (HCC) 12/25/2018   Osteoporosis 12/07/2016   Adverse effect of anticonvulsants 12/07/2016   Numbness in feet 04/28/2014   Inguinal hernia, left 04/02/2013   B12 deficiency 07/02/2012   H/O adenomatous polyp of colon 07/02/2012   Intra-abdominal fluid collection 06/18/2011   Fluid overload 06/06/2011   Ileus, postoperative (HCC) 06/06/2011   Appendicitis with  abscess, appendectomy 06/01/2011. 06/01/2011   RESTLESS LEGS SYNDROME 02/28/2009   History of seizure 02/09/2009   DYSPNEA 02/09/2009    ONSET DATE: 01/09/2023 (MD referral)  REFERRING DIAG: M53.82 (ICD-10-CM) - Neck muscle weakness   THERAPY DIAG:  Abnormal posture  Muscle weakness (generalized)  Unsteadiness on feet  Rationale for Evaluation and Treatment: Rehabilitation  SUBJECTIVE:  SUBJECTIVE STATEMENT: The exercises are going ok Pt accompanied by: self  PERTINENT HISTORY: osteoporosis, seizures (controlled by meds), neuropathy, RLS  PAIN:  Are you having pain? No   PRECAUTIONS: Other: hx of nerve impingement in neck  WEIGHT BEARING RESTRICTIONS: No  FALLS: Has patient fallen in last 6 months? No (has hx of 3 falls, but none in the past year)  LIVING ENVIRONMENT: Lives with: lives with their spouse Lives in: House/apartment   PLOF: IndependentTrying to gradually increase to dumbbell weights for biceps/triceps curls.  Enjoys walking in the neighborhood and working on the computer  PATIENT GOALS: To help strengthen neck and hold head up better  OBJECTIVE:   TODAY'S TREATMENT: 01/31/23 Activity Comments  Supine neck ext iso and chin retraction   Seated chin retraction 1x10 Against red band resistance  Seated  lat row 2x10 25#  Seated row 2x10 20#  Paloff press 1x10          TODAY'S TREATMENT: 01/29/2023 Activity Comments  Supine Isometric Neck Extension  x 10   Supine Passive Cervical Retraction  - x 10  -Supine Posterior Pelvic Tilt  x 10  - Kettlebell Suitcase Carry /  Farmer's Walk  20 ft x 2 each  - Push-Up on Counter  - 10 reps Review of HEP-reminder cues for technique, especially with posterior pelvic tilts (pt has been doing anterior pelvic tilts-instructed  today in posterior pelvic tilts  Supine shoulder retraction, 3 x 5 reps   Forward lean to upright posture, focus on neck retraction, 8 reps   Supine horizontal shoulder adduction, 10 reps, then 5 reps, red theraband Cues for eccentric control  Seated scapular retraction, 5# R and L, 10 reps Performed together  Standing scapular rows, 5#, 10 reps each arm Shoulder extension 5# x 10 reps each arm Performed separately      PATIENT EDUCATION: Education details: Updates to HEP Person educated: Patient Education method: Explanation Education comprehension: verbalized understanding  HOME EXERCISE PROGRAM:  Access Code: [email or web address] URL: [email or web address] Date: 01/29/2023 Prepared by: [facility] - Outpatient  Rehab - [facility]  Exercises - Supine Isometric Neck Extension  - 1 x daily - 7 x weekly - 1-3 sets - 10 reps - 2 sec hold - Supine Passive Cervical Retraction  - 1 x daily - 7 x weekly - 3 sets - 10 reps - 2 sec hold - Supine Posterior Pelvic Tilt  - 1 x daily - 7 x weekly - 3 sets - 10 reps - Kettlebell Suitcase Carry  - 1 x daily - 7 x weekly - 3 sets - 10 reps - Farmer's Walk  - 1 x daily - 7 x weekly - 3 sets - 10 reps - Push-Up on Counter  - 1 x daily - 7 x weekly - 3-5 sets - 30 sec hold - Supine Shoulder Horizontal Adduction Over Table Edge  - 1 x daily - 5 x weekly - 1-3 sets - 10 reps - Isometric Cervical Extension at [employer] with Ball  - 1 x daily - 7 x weekly - 3 sets - 10 reps ---------------------------------------------------------------------------------- Objective measures below taken at initial eval:  DIAGNOSTIC FINDINGS: Hx of MRI for cervical spine (through [facility]-no specific records available)  COGNITION: Overall cognitive status: Within functional limits for tasks assessed   SENSATION: Occasional numbness in 4th/5th fingers in R hand   POSTURE: forward head, increased thoracic kyphosis, and posterior pelvic tilt in sitting In  standing:  increased anterior pelvic tilt/increased lordosis; scoliosis;  R hip lower than L  Cervical Spine ROM:  Holds head in 40 degrees flexion; with cues improves to 25 degrees  Flexion     Extension  -25  Lateral Flexion Limitation R > L  Rotation R  50 degrees  Rotation L  48 degrees   Upper Extremity ROM WFL A/ROM  UPPER EXTREMITY STRENGTH:  R    L Shoulder flexion     4+    4+ Shoulder Extension Shoulder Abduction     4+    4+ Elbow Flexion     5    5  Elbow Extension     5    5  No pain with resisted motions   TRANSFERS: Assistive device utilized: None  Sit to stand: Modified independence Stand to sit: Modified independence   FUNCTIONAL TESTS:  5 times sit to stand: 14.59 sec  M-CTSIB  Condition 1: Firm Surface, EO 30 Sec, Normal Sway  Condition 2: Firm Surface, EC 30 Sec, Mild Sway  Condition 3: Foam Surface, EO 30 Sec, Mild Sway  Condition 4: Foam Surface, EC 30 Sec, Moderate Sway        GOALS: Goals reviewed with patient? Yes  SHORT TERM GOALS: = LTGs   LONG TERM GOALS: Target date: 02/22/2023  Pt will be independent with HEP for improved flexibility, strength, posture. Baseline:  Goal status: IN PROGRESS  2.  Pt will demo improved posture with improved resting neck posture to neutral. Baseline: holds in 25 degrees flexion at best Goal status: IN PROGRESS  3.  Pt will improve 5x sit to stand to <12 sec for improved strength, balance. Baseline: 14.59 sec Goal status: IN PROGRESS  4.  Pt will report no neck, arm pain with functional activities. Baseline: hx of radiating pain in R arm Goal status: IN PROGRESS   ASSESSMENT:  CLINICAL IMPRESSION: Continued with additional strengthening exercises to improve scapular and cervical extension engagement to improve posture and education in training for endurance vs strength with postural muscles.  Voices understanding. Trianing in techniques for isoemtric set of neck before lifting heavy objects to  reduce cervical strain. Continued sessions to progress POC details  OBJECTIVE IMPAIRMENTS: decreased balance, decreased ROM, decreased strength, impaired flexibility, and postural dysfunction.   ACTIVITY LIMITATIONS: sitting, standing, transfers, and reach over head  PARTICIPATION LIMITATIONS: community activity and exercise, computer work/activities  PERSONAL FACTORS: 3+ comorbidities: see above  are also affecting patient's functional outcome.   REHAB POTENTIAL: Good  CLINICAL DECISION MAKING: Stable/uncomplicated  EVALUATION COMPLEXITY: Low  PLAN:  PT FREQUENCY: 1-2x/week   PT DURATION: other: 5 weeks, including eval week  PLANNED INTERVENTIONS: Therapeutic exercises, Therapeutic activity, Neuromuscular re-education, Balance training, Gait training, Patient/Family education, Self Care, Dry Needling, Moist heat, and Manual therapy  PLAN FOR NEXT SESSION: HEP review and progression.  Continue neck retraction and postural strenghtening, abdominal activation   Dion Body, PT 01/31/2023, 7:59 AM  [facility] at [facility] [address] [place], [address] [place], [place] [address] Phone # [phone] Fax # [phone]

## 2023-02-04 NOTE — Therapy (Signed)
OUTPATIENT PHYSICAL THERAPY NEURO TREATMENT   Patient Name: Nathan [name] MRN: [id number] DOB:1946-05-14, 77 y.o., male Today's Date: 02/05/2023   PCP: Kristian Covey, MD REFERRING PROVIDER: Kristian Covey, MD   END OF SESSION:  PT End of Session - 02/05/23 1059     Visit Number 5    Number of Visits 9    Date for PT Re-Evaluation 02/22/23    Authorization Type [employer]    Authorization Time Period 7-02/2023-02/22/2023    Authorization - Visit Number 5    Authorization - Number of Visits 8    PT Start Time 1019    PT Stop Time 1058    PT Time Calculation (min) 39 min    Activity Tolerance Patient tolerated treatment well    Behavior During Therapy WFL for tasks assessed/performed               Past Medical History:  Diagnosis Date   Colon polyps    Sessile Serrated adenomas   DYSPNEA 02/09/2009   RESTLESS LEGS SYNDROME 02/28/2009   SEIZURE DISORDER 02/09/2009   last seisure Sep 10, 2001   Past Surgical History:  Procedure Laterality Date   COLONOSCOPY     HERNIA REPAIR Right 02/11/09   dr. 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 06/01/2011   RESTLESS LEGS SYNDROME 02/28/2009   History of seizure 02/09/2009   DYSPNEA 02/09/2009    ONSET DATE: 01/09/2023 (MD referral)  REFERRING DIAG: M53.82 (ICD-10-CM) - Neck muscle weakness   THERAPY DIAG:  Abnormal posture  Muscle weakness (generalized)  Unsteadiness on feet  Rationale for Evaluation and Treatment: Rehabilitation  SUBJECTIVE:  SUBJECTIVE STATEMENT: Saturday I did all my PT pretty heavily at home, and it has been sore which worries me.   Pt accompanied by: self  PERTINENT HISTORY: osteoporosis, seizures (controlled by meds), neuropathy, RLS  PAIN:  Are you having pain? No   PRECAUTIONS: Other: hx of nerve impingement in neck  WEIGHT BEARING RESTRICTIONS: No  FALLS: Has patient fallen in last 6 months? No (has hx of 3 falls, but none in the past year)  LIVING ENVIRONMENT: Lives with: lives with their spouse Lives in: House/apartment   PLOF: IndependentTrying to gradually increase to dumbbell weights for biceps/triceps curls.  Enjoys walking in the neighborhood and working on the computer  PATIENT GOALS: To help strengthen neck and hold head up better  OBJECTIVE:     TODAY'S TREATMENT: 02/05/23 Activity Comments  HEP review:  supine isometric neck extension 5x2" supine passive cervical retraction 2x5x2" supine posterior pelvic tilt 2x5 supine shoulder horizontal ABD with red TB 10x    isometric cervical extension into ball on wall 2x5 push up on counter 30" (8 reps)  Noted some neck discomfort with eccentric neck flexion. Reported improved tolerance for cervical retraction with 50% effort. Provided manual cues to encourage anterior and posterior pelvic tilt. Required correction of form and alignment of horizontal  ABD.  Improved tolerance for standing cervical extension with 50% effort.   Cueing to maintain neutral neck with push ups   Cervical rotation SNAG to tolerance 5x each side    Cueing for arm alignment to reduce R shoulder discomfort , focus to maintain at neutral as possible neck alignment when rotating   corner pec stretch 2x30" Good tolerance      HOME EXERCISE PROGRAM Last updated: 02/05/23 Access Code: [email or web address] URL: [email or web address] Date: 02/05/2023 Prepared by: [facility] - Outpatient  Rehab - [facility]  Exercises - Supine Isometric Neck Extension  - 1 x daily - 7 x weekly - 1-3 sets - 10 reps - 2 sec hold - Supine Passive Cervical Retraction  - 1 x daily - 7 x weekly - 3 sets - 10 reps - 2 sec hold - Supine Posterior Pelvic Tilt  - 1 x daily - 7 x weekly - 3 sets - 10 reps - Kettlebell Suitcase Carry  - 1 x daily - 7 x weekly - 3 sets - 10 reps - Farmer's Walk  - 1 x daily - 7 x weekly - 3 sets - 10 reps - Push-Up on Counter  - 1 x daily - 7 x weekly - 3-5 sets - 30 sec hold - Supine Shoulder Horizontal Adduction Over Table Edge  - 1 x daily - 5 x weekly - 1-3 sets - 10 reps - Isometric Cervical Extension at Wall with Ball  - 1 x daily - 7 x weekly - 3 sets - 10 reps - Corner Pec Major Stretch  - 1 x daily - 5 x weekly - 2 sets - 30 sec hold   PATIENT EDUCATION: Education details: edu on radiculopathy symptoms and discussed patient's plans on safely increased weights with strength training and returning to other strength training exercises ; HEP update Person educated: Patient Education method: Explanation, Demonstration, Tactile cues, Verbal cues, and Handouts Education comprehension: verbalized understanding and returned demonstration    ---------------------------------------------------------------------------------- Objective measures below taken at initial eval:  DIAGNOSTIC FINDINGS: Hx of MRI for cervical spine (through [facility]-no  specific records available)  COGNITION: Overall cognitive status: Within functional limits for tasks assessed   SENSATION: Occasional  numbness in 4th/5th fingers in R hand   POSTURE: forward head, increased thoracic kyphosis, and posterior pelvic tilt in sitting In standing:  increased anterior pelvic tilt/increased lordosis; scoliosis; R hip lower than L  Cervical Spine ROM:  Holds head in 40 degrees flexion; with cues improves to 25 degrees  Flexion     Extension  -25  Lateral Flexion Limitation R > L  Rotation R  50 degrees  Rotation L  48 degrees   Upper Extremity ROM WFL A/ROM  UPPER EXTREMITY STRENGTH:  R    L Shoulder flexion     4+    4+ Shoulder Extension Shoulder Abduction     4+    4+ Elbow Flexion     5    5  Elbow Extension     5    5  No pain with resisted motions   TRANSFERS: Assistive device utilized: None  Sit to stand: Modified independence Stand to sit: Modified independence   FUNCTIONAL TESTS:  5 times sit to stand: 14.59 sec  M-CTSIB  Condition 1: Firm Surface, EO 30 Sec, Normal Sway  Condition 2: Firm Surface, EC 30 Sec, Mild Sway  Condition 3: Foam Surface, EO 30 Sec, Mild Sway  Condition 4: Foam Surface, EC 30 Sec, Moderate Sway        GOALS: Goals reviewed with patient? Yes  SHORT TERM GOALS: = LTGs   LONG TERM GOALS: Target date: 02/22/2023  Pt will be independent with HEP for improved flexibility, strength, posture. Baseline:  Goal status: IN PROGRESS  2.  Pt will demo improved posture with improved resting neck posture to neutral. Baseline: holds in 25 degrees flexion at best Goal status: IN PROGRESS  3.  Pt will improve 5x sit to stand to <12 sec for improved strength, balance. Baseline: 14.59 sec Goal status: IN PROGRESS  4.  Pt will report no neck, arm pain with functional activities. Baseline: hx of radiating pain in R arm Goal status: IN PROGRESS   ASSESSMENT:  CLINICAL IMPRESSION: Patient arrived to session  with report of some neck soreness after HEP over the weekend, however no pain at start of session today. Reviewed HEP in detail with cueing and modifications for improved tolerance. Initiated gentle pec and cervical stretching and with cueing for alignment. Patient noted some mild increase in R UT pain at end of session.   OBJECTIVE IMPAIRMENTS: decreased balance, decreased ROM, decreased strength, impaired flexibility, and postural dysfunction.   ACTIVITY LIMITATIONS: sitting, standing, transfers, and reach over head  PARTICIPATION LIMITATIONS: community activity and exercise, computer work/activities  PERSONAL FACTORS: 3+ comorbidities: see above  are also affecting patient's functional outcome.   REHAB POTENTIAL: Good  CLINICAL DECISION MAKING: Stable/uncomplicated  EVALUATION COMPLEXITY: Low  PLAN:  PT FREQUENCY: 1-2x/week   PT DURATION: other: 5 weeks, including eval week  PLANNED INTERVENTIONS: Therapeutic exercises, Therapeutic activity, Neuromuscular re-education, Balance training, Gait training, Patient/Family education, Self Care, Dry Needling, Moist heat, and Manual therapy  PLAN FOR NEXT SESSION: HEP review and progression.  Continue neck retraction and postural strenghtening, abdominal activation   Anette Guarneri, PT, DPT 02/05/23 11:05 AM  [facility] at [facility] [address] [place], [address] [place], [place] [address] Phone # [phone] Fax # [phone]

## 2023-02-05 ENCOUNTER — Ambulatory Visit: Payer: Medicare PPO | Admitting: Physical Therapy

## 2023-02-05 ENCOUNTER — Encounter: Payer: Self-pay | Admitting: Physical Therapy

## 2023-02-05 DIAGNOSIS — M6281 Muscle weakness (generalized): Secondary | ICD-10-CM

## 2023-02-05 DIAGNOSIS — R2681 Unsteadiness on feet: Secondary | ICD-10-CM

## 2023-02-05 DIAGNOSIS — R293 Abnormal posture: Secondary | ICD-10-CM

## 2023-02-05 DIAGNOSIS — M5382 Other specified dorsopathies, cervical region: Secondary | ICD-10-CM | POA: Diagnosis not present

## 2023-02-07 ENCOUNTER — Ambulatory Visit: Payer: Medicare PPO

## 2023-02-07 DIAGNOSIS — M5382 Other specified dorsopathies, cervical region: Secondary | ICD-10-CM | POA: Diagnosis not present

## 2023-02-07 DIAGNOSIS — R293 Abnormal posture: Secondary | ICD-10-CM | POA: Diagnosis not present

## 2023-02-07 DIAGNOSIS — M6281 Muscle weakness (generalized): Secondary | ICD-10-CM

## 2023-02-07 DIAGNOSIS — R2681 Unsteadiness on feet: Secondary | ICD-10-CM | POA: Diagnosis not present

## 2023-02-07 NOTE — Therapy (Signed)
OUTPATIENT PHYSICAL THERAPY NEURO TREATMENT   Patient Name: Nathan [name] MRN: [id number] DOB:30-Aug-1945, 77 y.o., male Today's Date: 02/07/2023   PCP: Kristian Covey, MD REFERRING PROVIDER: Kristian Covey, MD   END OF SESSION:  PT End of Session - 02/07/23 0759     Visit Number 6    Number of Visits 9    Date for PT Re-Evaluation 02/22/23    Authorization Type [employer]    Authorization Time Period 7-02/2023-02/22/2023    Authorization - Visit Number 6    Authorization - Number of Visits 8    PT Start Time 0800    PT Stop Time 0845    PT Time Calculation (min) 45 min    Activity Tolerance Patient tolerated treatment well    Behavior During Therapy [facility] for tasks assessed/performed               Past Medical History:  Diagnosis Date   Colon polyps    Sessile Serrated adenomas   DYSPNEA 02/09/2009   RESTLESS LEGS SYNDROME 02/28/2009   SEIZURE DISORDER 02/09/2009   last seisure Sep 10, 2001   Past Surgical History:  Procedure Laterality Date   COLONOSCOPY     HERNIA REPAIR Right 02/11/09   dr. Mignon [facility]   HERNIA REPAIR Left    LAPAROSCOPIC APPENDECTOMY  06/01/2011   Procedure: APPENDECTOMY LAPAROSCOPIC;  Surgeon: Kandis Cocking, MD;  Location: [facility];  Service: General;  Laterality: N/A;   POLYPECTOMY     Patient Active Problem List   Diagnosis Date Noted   Pain in right hand 09/14/2022   Right knee pain 12/06/2021   Intractable right heel pain 01/03/2021   Hereditary and idiopathic neuropathy 11/17/2019   Other intervertebral disc degeneration, lumbar region 11/17/2019   Seizures (HCC) 12/25/2018   Osteoporosis 12/07/2016   Adverse effect of anticonvulsants 12/07/2016   Numbness in feet 04/28/2014   Inguinal hernia, left 04/02/2013   B12 deficiency 07/02/2012   H/O adenomatous polyp of colon 07/02/2012   Intra-abdominal fluid collection 06/18/2011   Fluid overload 06/06/2011   Ileus, postoperative (HCC) 06/06/2011   Appendicitis with  abscess, appendectomy 06/01/2011. 06/01/2011   RESTLESS LEGS SYNDROME 02/28/2009   History of seizure 02/09/2009   DYSPNEA 02/09/2009    ONSET DATE: 01/09/2023 (MD referral)  REFERRING DIAG: M53.82 (ICD-10-CM) - Neck muscle weakness   THERAPY DIAG:  Abnormal posture  Muscle weakness (generalized)  Unsteadiness on feet  Rationale for Evaluation and Treatment: Rehabilitation  SUBJECTIVE:  SUBJECTIVE STATEMENT: No new issues  Pt accompanied by: self  PERTINENT HISTORY: osteoporosis, seizures (controlled by meds), neuropathy, RLS  PAIN:  Are you having pain? No   PRECAUTIONS: Other: hx of nerve impingement in neck  WEIGHT BEARING RESTRICTIONS: No  FALLS: Has patient fallen in last 6 months? No (has hx of 3 falls, but none in the past year)  LIVING ENVIRONMENT: Lives with: lives with their spouse Lives in: House/apartment   PLOF: IndependentTrying to gradually increase to dumbbell weights for biceps/triceps curls.  Enjoys walking in the neighborhood and working on the computer  PATIENT GOALS: To help strengthen neck and hold head up better  OBJECTIVE:   TODAY'S TREATMENT: 02/07/23 Activity Comments  Prone chin retractions 10x2 sec hold   Supine deep cervical flexion lift-off 10x3 sec hold   Pelvic tilts 10x   Supine hip flexion isometric 10x 2 sec hold HEP addition  Scap adduction row green HEP addition         TODAY'S TREATMENT: 02/05/23 Activity Comments  HEP review:  supine isometric neck extension 5x2" supine passive cervical retraction 2x5x2" supine posterior pelvic tilt 2x5 supine shoulder horizontal ABD with red TB 10x    isometric cervical extension into ball on wall 2x5 push up on counter 30" (8 reps)  Noted some neck discomfort with eccentric neck flexion.  Reported improved tolerance for cervical retraction with 50% effort. Provided manual cues to encourage anterior and posterior pelvic tilt. Required correction of form and alignment of horizontal ABD.  Improved tolerance for standing cervical extension with 50% effort.   Cueing to maintain neutral neck with push ups   Cervical rotation SNAG to tolerance 5x each side    Cueing for arm alignment to reduce R shoulder discomfort , focus to maintain at neutral as possible neck alignment when rotating   corner pec stretch 2x30" Good tolerance      HOME EXERCISE PROGRAM Last updated: 02/05/23 Access Code: [email or web address] URL: [email or web address] Date: 02/05/2023 Prepared by: [facility] - Outpatient  Rehab - [facility]  Exercises - Supine Isometric Neck Extension  - 1 x daily - 7 x weekly - 1-3 sets - 10 reps - 2 sec hold - Supine Passive Cervical Retraction  - 1 x daily - 7 x weekly - 3 sets - 10 reps - 2 sec hold - Supine Posterior Pelvic Tilt  - 1 x daily - 7 x weekly - 3 sets - 10 reps - Kettlebell Suitcase Carry  - 1 x daily - 7 x weekly - 3 sets - 10 reps - Farmer's Walk  - 1 x daily - 7 x weekly - 3 sets - 10 reps - Push-Up on Counter  - 1 x daily - 7 x weekly - 3-5 sets - 30 sec hold - Supine Shoulder Horizontal Adduction Over Table Edge  - 1 x daily - 5 x weekly - 1-3 sets - 10 reps - Isometric Cervical Extension at Wall with Ball  - 1 x daily - 7 x weekly - 3 sets - 10 reps - Corner Pec Major Stretch  - 1 x daily - 5 x weekly - 2 sets - 30 sec hold - Prone Cervical Retraction  - 1 x daily - 7 x weekly - 3 sets - 10 reps - 3-5 sec hold hold - Supine Segmental Cervical Flexion  - 1 x daily - 7 x weekly - 3 sets - 10 reps - 3-5 sec hold - Hip Drives with Arm Rows into W position  -  1 x daily - 7 x weekly - 3 sets - 10 reps  PATIENT EDUCATION: Education details: edu on radiculopathy symptoms and discussed patient's plans on safely increased weights with strength training  and returning to other strength training exercises ; HEP update Person educated: Patient Education method: Explanation, Demonstration, Tactile cues, Verbal cues, and Handouts Education comprehension: verbalized understanding and returned demonstration    ---------------------------------------------------------------------------------- Objective measures below taken at initial eval:  DIAGNOSTIC FINDINGS: Hx of MRI for cervical spine (through [facility]-no specific records available)  COGNITION: Overall cognitive status: Within functional limits for tasks assessed   SENSATION: Occasional numbness in 4th/5th fingers in R hand   POSTURE: forward head, increased thoracic kyphosis, and posterior pelvic tilt in sitting In standing:  increased anterior pelvic tilt/increased lordosis; scoliosis; R hip lower than L  Cervical Spine ROM:  Holds head in 40 degrees flexion; with cues improves to 25 degrees  Flexion     Extension  -25  Lateral Flexion Limitation R > L  Rotation R  50 degrees  Rotation L  48 degrees   Upper Extremity ROM WFL A/ROM  UPPER EXTREMITY STRENGTH:  R    L Shoulder flexion     4+    4+ Shoulder Extension Shoulder Abduction     4+    4+ Elbow Flexion     5    5  Elbow Extension     5    5  No pain with resisted motions   TRANSFERS: Assistive device utilized: None  Sit to stand: Modified independence Stand to sit: Modified independence   FUNCTIONAL TESTS:  5 times sit to stand: 14.59 sec  M-CTSIB  Condition 1: Firm Surface, EO 30 Sec, Normal Sway  Condition 2: Firm Surface, EC 30 Sec, Mild Sway  Condition 3: Foam Surface, EO 30 Sec, Mild Sway  Condition 4: Foam Surface, EC 30 Sec, Moderate Sway        GOALS: Goals reviewed with patient? Yes  SHORT TERM GOALS: = LTGs   LONG TERM GOALS: Target date: 02/22/2023  Pt will be independent with HEP for improved flexibility, strength, posture. Baseline:  Goal status: IN PROGRESS  2.  Pt will  demo improved posture with improved resting neck posture to neutral. Baseline: holds in 25 degrees flexion at best Goal status: IN PROGRESS  3.  Pt will improve 5x sit to stand to <12 sec for improved strength, balance. Baseline: 14.59 sec Goal status: IN PROGRESS  4.  Pt will report no neck, arm pain with functional activities. Baseline: hx of radiating pain in R arm Goal status: IN PROGRESS   ASSESSMENT:  CLINICAL IMPRESSION: Instructed in addition postural endurance activities against gravity by position of prone and supine and instruction in increased isometric hold times for muscular endurance. Additional scapular strength/stability exericse for improve posture. Tolerating sessions well withou tadverse effects  OBJECTIVE IMPAIRMENTS: decreased balance, decreased ROM, decreased strength, impaired flexibility, and postural dysfunction.   ACTIVITY LIMITATIONS: sitting, standing, transfers, and reach over head  PARTICIPATION LIMITATIONS: community activity and exercise, computer work/activities  PERSONAL FACTORS: 3+ comorbidities: see above  are also affecting patient's functional outcome.   REHAB POTENTIAL: Good  CLINICAL DECISION MAKING: Stable/uncomplicated  EVALUATION COMPLEXITY: Low  PLAN:  PT FREQUENCY: 1-2x/week   PT DURATION: other: 5 weeks, including eval week  PLANNED INTERVENTIONS: Therapeutic exercises, Therapeutic activity, Neuromuscular re-education, Balance training, Gait training, Patient/Family education, Self Care, Dry Needling, Moist heat, and Manual therapy  PLAN FOR NEXT SESSION: HEP  review and progression.  Continue neck retraction and postural strenghtening, abdominal activation   8:45 AM, 02/07/23 M. Shary Decamp, PT, DPT Physical Therapist- [place] Office Number: [phone]

## 2023-02-11 NOTE — Therapy (Signed)
OUTPATIENT PHYSICAL THERAPY NEURO TREATMENT   Patient Name: Nathan [name] MRN: [id number] DOB:1945/09/27, 77 y.o., male Today's Date: 02/12/2023   PCP: Kristian Covey, MD REFERRING PROVIDER: Kristian Covey, MD   END OF SESSION:  PT End of Session - 02/12/23 [phone]     Visit Number 7    Number of Visits 9    Date for PT Re-Evaluation 02/22/23    Authorization Type [employer]    Authorization Time Period 7-02/2023-02/22/2023    Authorization - Visit Number 7    Authorization - Number of Visits 8    PT Start Time 0801    PT Stop Time 0842    PT Time Calculation (min) 41 min    Activity Tolerance Patient tolerated treatment well    Behavior During Therapy [facility] for tasks assessed/performed                Past Medical History:  Diagnosis Date   Colon polyps    Sessile Serrated adenomas   DYSPNEA 02/09/2009   RESTLESS LEGS SYNDROME 02/28/2009   SEIZURE DISORDER 02/09/2009   last seisure Sep 10, 2001   Past Surgical History:  Procedure Laterality Date   COLONOSCOPY     HERNIA REPAIR Right 02/11/09   dr. Mignon Pine -[facility]   HERNIA REPAIR Left    LAPAROSCOPIC APPENDECTOMY  06/01/2011   Procedure: APPENDECTOMY LAPAROSCOPIC;  Surgeon: Kandis Cocking, MD;  Location: [facility];  Service: General;  Laterality: N/A;   POLYPECTOMY     Patient Active Problem List   Diagnosis Date Noted   Pain in right hand 09/14/2022   Right knee pain 12/06/2021   Intractable right heel pain 01/03/2021   Hereditary and idiopathic neuropathy 11/17/2019   Other intervertebral disc degeneration, lumbar region 11/17/2019   Seizures (HCC) 12/25/2018   Osteoporosis 12/07/2016   Adverse effect of anticonvulsants 12/07/2016   Numbness in feet 04/28/2014   Inguinal hernia, left 04/02/2013   B12 deficiency 07/02/2012   H/O adenomatous polyp of colon 07/02/2012   Intra-abdominal fluid collection 06/18/2011   Fluid overload 06/06/2011   Ileus, postoperative (HCC) 06/06/2011   Appendicitis with  abscess, appendectomy 06/01/2011. 06/01/2011   RESTLESS LEGS SYNDROME 02/28/2009   History of seizure 02/09/2009   DYSPNEA 02/09/2009    ONSET DATE: 01/09/2023 (MD referral)  REFERRING DIAG: M53.82 (ICD-10-CM) - Neck muscle weakness   THERAPY DIAG:  Abnormal posture  Muscle weakness (generalized)  Unsteadiness on feet  Rationale for Evaluation and Treatment: Rehabilitation  SUBJECTIVE:  SUBJECTIVE STATEMENT: "Had a birthday, but that's about it." Reports that after review of HEP, notes less soreness in the neck.   Pt accompanied by: self  PERTINENT HISTORY: osteoporosis, seizures (controlled by meds), neuropathy, RLS  PAIN:  Are you having pain? No   PRECAUTIONS: Other: hx of nerve impingement in neck  WEIGHT BEARING RESTRICTIONS: No  FALLS: Has patient fallen in last 6 months? No (has hx of 3 falls, but none in the past year)  LIVING ENVIRONMENT: Lives with: lives with their spouse Lives in: House/apartment   PLOF: IndependentTrying to gradually increase to dumbbell weights for biceps/triceps curls.  Enjoys walking in the neighborhood and working on the computer  PATIENT GOALS: To help strengthen neck and hold head up better  OBJECTIVE:     TODAY'S TREATMENT: 02/12/23 Activity Comments  paloff press green TB 10x each side Cueing to lift head, maintain neutral hips/shoulders   standing resisted trunk rotation with green TB 10x each  Cueing to control eccentric phase; Cueing to lift head. Advised to reduce ROM to L d/t c/o some L sided trunk discomfort with rotation  standing shoulder IR/ER green TB 10x each  Cueing to maintain neutral wrist and retract scapulae slightly to stand taller; performed to midline   mod thomas stretch 2x30" each LE Assist in finding comfortable position  of stretch. Demonstrated how to use stretch out strap  fig 4 stretch 30" each Tighter on L; cueing to avoid pushing into pain   squat to chair + green medball 10x Cueing to widen BOS, cues to shift wt posteriorly       HOME EXERCISE PROGRAM Last updated: 02/05/23 Access Code: [email or web address] URL: [email or web address] Date: 02/05/2023 Prepared by: [facility] - Outpatient  Rehab - [facility]  Exercises - Supine Isometric Neck Extension  - 1 x daily - 7 x weekly - 1-3 sets - 10 reps - 2 sec hold - Supine Passive Cervical Retraction  - 1 x daily - 7 x weekly - 3 sets - 10 reps - 2 sec hold - Supine Posterior Pelvic Tilt  - 1 x daily - 7 x weekly - 3 sets - 10 reps - Kettlebell Suitcase Carry  - 1 x daily - 7 x weekly - 3 sets - 10 reps - Farmer's Walk  - 1 x daily - 7 x weekly - 3 sets - 10 reps - Push-Up on Counter  - 1 x daily - 7 x weekly - 3-5 sets - 30 sec hold - Supine Shoulder Horizontal Adduction Over Table Edge  - 1 x daily - 5 x weekly - 1-3 sets - 10 reps - Isometric Cervical Extension at [employer] with Ball  - 1 x daily - 7 x weekly - 3 sets - 10 reps - Corner Pec Major Stretch  - 1 x daily - 5 x weekly - 2 sets - 30 sec hold - Prone Cervical Retraction  - 1 x daily - 7 x weekly - 3 sets - 10 reps - 3-5 sec hold hold - Supine Segmental Cervical Flexion  - 1 x daily - 7 x weekly - 3 sets - 10 reps - 3-5 sec hold - Hip Drives with Arm Rows into W position  - 1 x daily - 7 x weekly - 3 sets - 10 reps   ---------------------------------------------------------------------------------- Objective measures below taken at initial eval:  DIAGNOSTIC FINDINGS: Hx of MRI for cervical spine (through [facility]-no specific records available)  COGNITION: Overall cognitive status: Within functional limits  for tasks assessed   SENSATION: Occasional numbness in 4th/5th fingers in R hand   POSTURE: forward head, increased thoracic kyphosis, and posterior pelvic tilt in  sitting In standing:  increased anterior pelvic tilt/increased lordosis; scoliosis; R hip lower than L  Cervical Spine ROM:  Holds head in 40 degrees flexion; with cues improves to 25 degrees  Flexion     Extension  -25  Lateral Flexion Limitation R > L  Rotation R  50 degrees  Rotation L  48 degrees   Upper Extremity ROM WFL A/ROM  UPPER EXTREMITY STRENGTH:  R    L Shoulder flexion     4+    4+ Shoulder Extension Shoulder Abduction     4+    4+ Elbow Flexion     5    5  Elbow Extension     5    5  No pain with resisted motions   TRANSFERS: Assistive device utilized: None  Sit to stand: Modified independence Stand to sit: Modified independence   FUNCTIONAL TESTS:  5 times sit to stand: 14.59 sec  M-CTSIB  Condition 1: Firm Surface, EO 30 Sec, Normal Sway  Condition 2: Firm Surface, EC 30 Sec, Mild Sway  Condition 3: Foam Surface, EO 30 Sec, Mild Sway  Condition 4: Foam Surface, EC 30 Sec, Moderate Sway        GOALS: Goals reviewed with patient? Yes  SHORT TERM GOALS: = LTGs   LONG TERM GOALS: Target date: 02/22/2023  Pt will be independent with HEP for improved flexibility, strength, posture. Baseline:  Goal status: IN PROGRESS  2.  Pt will demo improved posture with improved resting neck posture to neutral. Baseline: holds in 25 degrees flexion at best Goal status: IN PROGRESS  3.  Pt will improve 5x sit to stand to <12 sec for improved strength, balance. Baseline: 14.59 sec Goal status: IN PROGRESS  4.  Pt will report no neck, arm pain with functional activities. Baseline: hx of radiating pain in R arm Goal status: IN PROGRESS   ASSESSMENT:  CLINICAL IMPRESSION: Patient arrived to session without concerns. Worked on progression of resistive core strengthening activities with cueing for proper alignment and positioning. Some fatigue with shoulder exercises noted, requiring active rest break with other activities before proceeding. Hip stretching was  utilized to assist in more upright posture; this was performed with cueing to avoid pushing into pain.  Patient tolerated session well and without complaints upon leaving.   OBJECTIVE IMPAIRMENTS: decreased balance, decreased ROM, decreased strength, impaired flexibility, and postural dysfunction.   ACTIVITY LIMITATIONS: sitting, standing, transfers, and reach over head  PARTICIPATION LIMITATIONS: community activity and exercise, computer work/activities  PERSONAL FACTORS: 3+ comorbidities: see above  are also affecting patient's functional outcome.   REHAB POTENTIAL: Good  CLINICAL DECISION MAKING: Stable/uncomplicated  EVALUATION COMPLEXITY: Low  PLAN:  PT FREQUENCY: 1-2x/week   PT DURATION: other: 5 weeks, including eval week  PLANNED INTERVENTIONS: Therapeutic exercises, Therapeutic activity, Neuromuscular re-education, Balance training, Gait training, Patient/Family education, Self Care, Dry Needling, Moist heat, and Manual therapy  PLAN FOR NEXT SESSION: check LTGS; Continue neck retraction and postural strenghtening, abdominal activation   Anette Guarneri, PT, DPT 02/12/23 8:43 AM  [facility] [address] [place], [address] [place], [place] [address] Phone # [phone] Fax # [phone]

## 2023-02-12 ENCOUNTER — Encounter: Payer: Self-pay | Admitting: Physical Therapy

## 2023-02-12 ENCOUNTER — Ambulatory Visit: Payer: Medicare PPO | Admitting: Physical Therapy

## 2023-02-12 DIAGNOSIS — R2681 Unsteadiness on feet: Secondary | ICD-10-CM

## 2023-02-12 DIAGNOSIS — M6281 Muscle weakness (generalized): Secondary | ICD-10-CM | POA: Diagnosis not present

## 2023-02-12 DIAGNOSIS — R293 Abnormal posture: Secondary | ICD-10-CM | POA: Diagnosis not present

## 2023-02-12 DIAGNOSIS — M5382 Other specified dorsopathies, cervical region: Secondary | ICD-10-CM | POA: Diagnosis not present

## 2023-02-18 NOTE — Therapy (Signed)
OUTPATIENT PHYSICAL THERAPY NEURO TREATMENT   Patient Name: Nathan [name] MRN: [id number] DOB:1946-06-12, 77 y.o., male Today's Date: 02/18/2023   PCP: Kristian Covey, MD REFERRING PROVIDER: Kristian Covey, MD   END OF SESSION:       Past Medical History:  Diagnosis Date   Colon polyps    Sessile Serrated adenomas   DYSPNEA 02/09/2009   RESTLESS LEGS SYNDROME 02/28/2009   SEIZURE DISORDER 02/09/2009   last seisure Sep 10, 2001   Past Surgical History:  Procedure Laterality Date   COLONOSCOPY     HERNIA REPAIR Right 02/11/09   dr. Mignon Pine -[facility]   HERNIA REPAIR Left    LAPAROSCOPIC APPENDECTOMY  06/01/2011   Procedure: APPENDECTOMY LAPAROSCOPIC;  Surgeon: Kandis Cocking, MD;  Location: [facility];  Service: General;  Laterality: N/A;   POLYPECTOMY     Patient Active Problem List   Diagnosis Date Noted   Pain in right hand 09/14/2022   Right knee pain 12/06/2021   Intractable right heel pain 01/03/2021   Hereditary and idiopathic neuropathy 11/17/2019   Other intervertebral disc degeneration, lumbar region 11/17/2019   Seizures (HCC) 12/25/2018   Osteoporosis 12/07/2016   Adverse effect of anticonvulsants 12/07/2016   Numbness in feet 04/28/2014   Inguinal hernia, left 04/02/2013   B12 deficiency 07/02/2012   H/O adenomatous polyp of colon 07/02/2012   Intra-abdominal fluid collection 06/18/2011   Fluid overload 06/06/2011   Ileus, postoperative (HCC) 06/06/2011   Appendicitis with abscess, appendectomy 06/01/2011. 06/01/2011   RESTLESS LEGS SYNDROME 02/28/2009   History of seizure 02/09/2009   DYSPNEA 02/09/2009    ONSET DATE: 01/09/2023 (MD referral)  REFERRING DIAG: M53.82 (ICD-10-CM) - Neck muscle weakness   THERAPY DIAG:  No diagnosis found.  Rationale for Evaluation and Treatment: Rehabilitation  SUBJECTIVE:                                                                                                                                                                                              SUBJECTIVE STATEMENT: "Had a birthday, but that's about it." Reports that after review of HEP, notes less soreness in the neck.   Pt accompanied by: self  PERTINENT HISTORY: osteoporosis, seizures (controlled by meds), neuropathy, RLS  PAIN:  Are you having pain? No   PRECAUTIONS: Other: hx of nerve impingement in neck  WEIGHT BEARING RESTRICTIONS: No  FALLS: Has patient fallen in last 6 months? No (has hx of 3 falls, but none in the past year)  LIVING ENVIRONMENT: Lives with: lives with their spouse Lives in: House/apartment   PLOF: IndependentTrying to gradually increase to dumbbell  weights for biceps/triceps curls.  Enjoys walking in the neighborhood and working on the computer  PATIENT GOALS: To help strengthen neck and hold head up better  OBJECTIVE:    TODAY'S TREATMENT: 02/19/23 Activity Comments                        HOME EXERCISE PROGRAM Last updated: 02/05/23 Access Code: [email or web address] URL: [email or web address] Date: 02/05/2023 Prepared by: [facility] - Outpatient  Rehab - [facility]  Exercises - Supine Isometric Neck Extension  - 1 x daily - 7 x weekly - 1-3 sets - 10 reps - 2 sec hold - Supine Passive Cervical Retraction  - 1 x daily - 7 x weekly - 3 sets - 10 reps - 2 sec hold - Supine Posterior Pelvic Tilt  - 1 x daily - 7 x weekly - 3 sets - 10 reps - Kettlebell Suitcase Carry  - 1 x daily - 7 x weekly - 3 sets - 10 reps - Farmer's Walk  - 1 x daily - 7 x weekly - 3 sets - 10 reps - Push-Up on Counter  - 1 x daily - 7 x weekly - 3-5 sets - 30 sec hold - Supine Shoulder Horizontal Adduction Over Table Edge  - 1 x daily - 5 x weekly - 1-3 sets - 10 reps - Isometric Cervical Extension at [employer] with Ball  - 1 x daily - 7 x weekly - 3 sets - 10 reps - Corner Pec Major Stretch  - 1 x daily - 5 x weekly - 2 sets - 30 sec hold - Prone Cervical Retraction  - 1 x daily - 7 x weekly - 3 sets - 10  reps - 3-5 sec hold hold - Supine Segmental Cervical Flexion  - 1 x daily - 7 x weekly - 3 sets - 10 reps - 3-5 sec hold - Hip Drives with Arm Rows into W position  - 1 x daily - 7 x weekly - 3 sets - 10 reps   ---------------------------------------------------------------------------------- Objective measures below taken at initial eval:  DIAGNOSTIC FINDINGS: Hx of MRI for cervical spine (through [facility]-no specific records available)  COGNITION: Overall cognitive status: Within functional limits for tasks assessed   SENSATION: Occasional numbness in 4th/5th fingers in R hand   POSTURE: forward head, increased thoracic kyphosis, and posterior pelvic tilt in sitting In standing:  increased anterior pelvic tilt/increased lordosis; scoliosis; R hip lower than L  Cervical Spine ROM:  Holds head in 40 degrees flexion; with cues improves to 25 degrees  Flexion     Extension  -25  Lateral Flexion Limitation R > L  Rotation R  50 degrees  Rotation L  48 degrees   Upper Extremity ROM WFL A/ROM  UPPER EXTREMITY STRENGTH:  R    L Shoulder flexion     4+    4+ Shoulder Extension Shoulder Abduction     4+    4+ Elbow Flexion     5    5  Elbow Extension     5    5  No pain with resisted motions   TRANSFERS: Assistive device utilized: None  Sit to stand: Modified independence Stand to sit: Modified independence   FUNCTIONAL TESTS:  5 times sit to stand: 14.59 sec  M-CTSIB  Condition 1: Firm Surface, EO 30 Sec, Normal Sway  Condition 2: Firm Surface, EC 30 Sec, Mild Sway  Condition 3: Foam Surface, EO 30  Sec, Mild Sway  Condition 4: Foam Surface, EC 30 Sec, Moderate Sway        GOALS: Goals reviewed with patient? Yes  SHORT TERM GOALS: = LTGs   LONG TERM GOALS: Target date: 02/22/2023  Pt will be independent with HEP for improved flexibility, strength, posture. Baseline:  Goal status: IN PROGRESS  2.  Pt will demo improved posture with improved resting neck  posture to neutral. Baseline: holds in 25 degrees flexion at best Goal status: IN PROGRESS  3.  Pt will improve 5x sit to stand to <12 sec for improved strength, balance. Baseline: 14.59 sec Goal status: IN PROGRESS  4.  Pt will report no neck, arm pain with functional activities. Baseline: hx of radiating pain in R arm Goal status: IN PROGRESS   ASSESSMENT:  CLINICAL IMPRESSION: Patient arrived to session without concerns. Worked on progression of resistive core strengthening activities with cueing for proper alignment and positioning. Some fatigue with shoulder exercises noted, requiring active rest break with other activities before proceeding. Hip stretching was utilized to assist in more upright posture; this was performed with cueing to avoid pushing into pain.  Patient tolerated session well and without complaints upon leaving.   OBJECTIVE IMPAIRMENTS: decreased balance, decreased ROM, decreased strength, impaired flexibility, and postural dysfunction.   ACTIVITY LIMITATIONS: sitting, standing, transfers, and reach over head  PARTICIPATION LIMITATIONS: community activity and exercise, computer work/activities  PERSONAL FACTORS: 3+ comorbidities: see above  are also affecting patient's functional outcome.   REHAB POTENTIAL: Good  CLINICAL DECISION MAKING: Stable/uncomplicated  EVALUATION COMPLEXITY: Low  PLAN:  PT FREQUENCY: 1-2x/week   PT DURATION: other: 5 weeks, including eval week  PLANNED INTERVENTIONS: Therapeutic exercises, Therapeutic activity, Neuromuscular re-education, Balance training, Gait training, Patient/Family education, Self Care, Dry Needling, Moist heat, and Manual therapy  PLAN FOR NEXT SESSION: check LTGS; Continue neck retraction and postural strenghtening, abdominal activation   Anette Guarneri, PT, DPT 02/18/23 7:52 AM  [facility] [address] [place], [address] [place], [place] [address] Phone #  [phone] Fax # [phone]

## 2023-02-19 ENCOUNTER — Encounter: Payer: Self-pay | Admitting: Physical Therapy

## 2023-02-19 ENCOUNTER — Ambulatory Visit: Payer: Medicare PPO | Attending: Family Medicine | Admitting: Physical Therapy

## 2023-02-19 DIAGNOSIS — M6281 Muscle weakness (generalized): Secondary | ICD-10-CM | POA: Insufficient documentation

## 2023-02-19 DIAGNOSIS — R2681 Unsteadiness on feet: Secondary | ICD-10-CM | POA: Diagnosis not present

## 2023-02-19 DIAGNOSIS — R293 Abnormal posture: Secondary | ICD-10-CM | POA: Insufficient documentation

## 2023-03-19 DIAGNOSIS — L570 Actinic keratosis: Secondary | ICD-10-CM | POA: Diagnosis not present

## 2023-03-19 DIAGNOSIS — L820 Inflamed seborrheic keratosis: Secondary | ICD-10-CM | POA: Diagnosis not present

## 2023-03-19 DIAGNOSIS — D224 Melanocytic nevi of scalp and neck: Secondary | ICD-10-CM | POA: Diagnosis not present

## 2023-03-19 DIAGNOSIS — Z85828 Personal history of other malignant neoplasm of skin: Secondary | ICD-10-CM | POA: Diagnosis not present

## 2023-03-19 DIAGNOSIS — D2371 Other benign neoplasm of skin of right lower limb, including hip: Secondary | ICD-10-CM | POA: Diagnosis not present

## 2023-03-19 DIAGNOSIS — D225 Melanocytic nevi of trunk: Secondary | ICD-10-CM | POA: Diagnosis not present

## 2023-03-19 DIAGNOSIS — L821 Other seborrheic keratosis: Secondary | ICD-10-CM | POA: Diagnosis not present

## 2023-03-26 ENCOUNTER — Encounter: Payer: Self-pay | Admitting: Podiatry

## 2023-03-26 ENCOUNTER — Ambulatory Visit: Payer: Medicare PPO | Admitting: Podiatry

## 2023-03-26 DIAGNOSIS — B351 Tinea unguium: Secondary | ICD-10-CM

## 2023-03-26 DIAGNOSIS — D2372 Other benign neoplasm of skin of left lower limb, including hip: Secondary | ICD-10-CM | POA: Diagnosis not present

## 2023-03-26 DIAGNOSIS — D2371 Other benign neoplasm of skin of right lower limb, including hip: Secondary | ICD-10-CM

## 2023-03-26 DIAGNOSIS — M79676 Pain in unspecified toe(s): Secondary | ICD-10-CM

## 2023-03-26 NOTE — Progress Notes (Signed)
He presents today chief complaint of painful elongated toenails which are painful with shoes and socks.  Objective: Also stable oriented x 3 there is no erythema edema cellulitis drainage or odor mild hammertoe deformities are noted toenails are long thick yellow dystrophic onychomycotic sharply incurvated and tender on palpation as well as debridement.  Assessment: Pain limb secondary to onychomycosis.  Plan: Debridement of toenails 1 through 5 bilateral is covered service secondary to painful onychomycosis.

## 2023-04-19 ENCOUNTER — Ambulatory Visit (INDEPENDENT_AMBULATORY_CARE_PROVIDER_SITE_OTHER): Payer: Medicare PPO

## 2023-04-19 DIAGNOSIS — Z23 Encounter for immunization: Secondary | ICD-10-CM | POA: Diagnosis not present

## 2023-04-24 DIAGNOSIS — L570 Actinic keratosis: Secondary | ICD-10-CM | POA: Diagnosis not present

## 2023-04-24 DIAGNOSIS — L820 Inflamed seborrheic keratosis: Secondary | ICD-10-CM | POA: Diagnosis not present

## 2023-06-03 DIAGNOSIS — H04123 Dry eye syndrome of bilateral lacrimal glands: Secondary | ICD-10-CM | POA: Diagnosis not present

## 2023-06-03 DIAGNOSIS — H2513 Age-related nuclear cataract, bilateral: Secondary | ICD-10-CM | POA: Diagnosis not present

## 2023-06-03 DIAGNOSIS — H11043 Peripheral pterygium, stationary, bilateral: Secondary | ICD-10-CM | POA: Diagnosis not present

## 2023-06-03 DIAGNOSIS — H0102A Squamous blepharitis right eye, upper and lower eyelids: Secondary | ICD-10-CM | POA: Diagnosis not present

## 2023-06-03 DIAGNOSIS — H43813 Vitreous degeneration, bilateral: Secondary | ICD-10-CM | POA: Diagnosis not present

## 2023-06-03 DIAGNOSIS — H0102B Squamous blepharitis left eye, upper and lower eyelids: Secondary | ICD-10-CM | POA: Diagnosis not present

## 2023-06-03 DIAGNOSIS — H353131 Nonexudative age-related macular degeneration, bilateral, early dry stage: Secondary | ICD-10-CM | POA: Diagnosis not present

## 2023-06-04 ENCOUNTER — Encounter: Payer: Self-pay | Admitting: Podiatry

## 2023-06-04 ENCOUNTER — Ambulatory Visit: Payer: Medicare PPO | Admitting: Podiatry

## 2023-06-04 DIAGNOSIS — D2372 Other benign neoplasm of skin of left lower limb, including hip: Secondary | ICD-10-CM

## 2023-06-04 DIAGNOSIS — D2371 Other benign neoplasm of skin of right lower limb, including hip: Secondary | ICD-10-CM | POA: Diagnosis not present

## 2023-06-04 DIAGNOSIS — B351 Tinea unguium: Secondary | ICD-10-CM

## 2023-06-04 NOTE — Progress Notes (Signed)
Chief Complaint  Patient presents with   Debridement    Trim toenails/calluses    HPI: 77 y.o. male presents today chief complaint of painful toenails which are worsened with socks and shoe gear.  The patient also complains of painful lesions on the ball of of left and right foot, left first toe adjacent to the nail plate and end of right third toe.  Patient denies any nausea, vomiting, fever, chills, chest pain, shortness of breath.  Past Medical History:  Diagnosis Date   Colon polyps    Sessile Serrated adenomas   DYSPNEA 02/09/2009   RESTLESS LEGS SYNDROME 02/28/2009   SEIZURE DISORDER 02/09/2009   last seisure Sep 10, 2001    Past Surgical History:  Procedure Laterality Date   COLONOSCOPY     HERNIA REPAIR Right 02/11/09   dr. Mignon Pine -[facility]   HERNIA REPAIR Left    LAPAROSCOPIC APPENDECTOMY  06/01/2011   Procedure: APPENDECTOMY LAPAROSCOPIC;  Surgeon: Kandis Cocking, MD;  Location: [facility];  Service: General;  Laterality: N/A;   POLYPECTOMY      Allergies  Allergen Reactions   Suprep [Na Sulfate-K Sulfate-Mg Sulf] Nausea And Vomiting    Hyponatremia leading to seizure   Carbamazepine     REACTION: fatigue, sleepy   Ciprofloxacin     Per pt, "thinks caused rash"   Clindamycin/Lincomycin Diarrhea   Metronidazole     REACTION: GI upset   Penicillins     REACTION: childhood, hives?   Phenytoin     REACTION: rash, leg swelling   Sulfamethoxazole Other (See Comments)   Trimethoprim Other (See Comments)   Bactrim Other (See Comments)    Caused headaches, that progressively worsened when on medication. Once off, they went away.   Phenytoin Sodium Extended Swelling and Rash    Swelling of ankles. Happened years ago per patient.    ROS negative except as stated in HPI   Physical Exam: There were no vitals filed for this visit.  General: The patient is alert and oriented x3 in no acute distress.  Dermatology: Skin is warm, dry and supple bilateral lower  extremities. Interspaces are clear of maceration and debris.  Nails 1 through 5 bilaterally are thickened, elongated, dystrophic with yellow discoloration, subungual debris with appearance consistent of mycotic infection.  Dystrophic changes with incurvated borders that are tender on palpation.  Lesions present to the ball of the second met head region bilaterally, left hallux medial aspect of the nail border, right third toe distal aspect.  Vascular: Palpable pedal pulses bilaterally. Capillary refill within normal limits.  Telangiectasias present. No appreciable edema.  No erythema or calor.   Assessment/Plan of Care: 1. Onychomycosis   2. Benign neoplasm of skin of left foot   3. Benign neoplasm of skin of right foot      No orders of the defined types were placed in this encounter.  None  Discussed clinical findings with patient today.  Mycotic Nails x 10 are debrided in thickness and length using aseptic nail nippers, mechanical bur was used to reduce thickness of the nails.  312 scalpel blade was used to pare down benign skin lesions to left and right foot without incident.  Patient may follow-up with myself or with Dr. Al Corpus in 3 months for continued care.   Nathan [name] L. Marchia Bond, AACFAS [facility]     [address]  [place], [place] [address]                Office [phone]  Fax [phone]

## 2023-08-19 DIAGNOSIS — Z01110 Encounter for hearing examination following failed hearing screening: Secondary | ICD-10-CM | POA: Diagnosis not present

## 2023-08-20 ENCOUNTER — Ambulatory Visit: Payer: Medicare PPO | Admitting: Podiatry

## 2023-08-26 ENCOUNTER — Other Ambulatory Visit: Payer: Self-pay | Admitting: Podiatry

## 2023-08-26 DIAGNOSIS — B351 Tinea unguium: Secondary | ICD-10-CM

## 2023-08-27 ENCOUNTER — Ambulatory Visit: Payer: Medicare PPO | Admitting: Podiatry

## 2023-09-03 ENCOUNTER — Ambulatory Visit (INDEPENDENT_AMBULATORY_CARE_PROVIDER_SITE_OTHER): Payer: Medicare PPO | Admitting: Podiatry

## 2023-09-03 ENCOUNTER — Encounter: Payer: Self-pay | Admitting: Podiatry

## 2023-09-03 DIAGNOSIS — D2372 Other benign neoplasm of skin of left lower limb, including hip: Secondary | ICD-10-CM | POA: Diagnosis not present

## 2023-09-03 DIAGNOSIS — B351 Tinea unguium: Secondary | ICD-10-CM | POA: Diagnosis not present

## 2023-09-03 DIAGNOSIS — D2371 Other benign neoplasm of skin of right lower limb, including hip: Secondary | ICD-10-CM

## 2023-09-03 DIAGNOSIS — M79676 Pain in unspecified toe(s): Secondary | ICD-10-CM | POA: Diagnosis not present

## 2023-09-03 NOTE — Progress Notes (Signed)
He presents today chief complaint of painfully elongated toenails and painful calluses bilaterally.  Objective: Pulses remain palpable no other lesions or wounds are noted.  Toenails are long thick yellow dystrophic onychomycotic sharp incurvated painful palpation as well as debridement.  Multiple benign skin lesions to the distal aspect of the toes and the plantar forefoot bilateral.  Again noncomplicated.  Assessment: Pain in limb secondary to onychomycosis benign skin lesions.  Plan: Debridement of benign skin lesion debridement of toenails 1-5 bilateral

## 2023-09-19 DIAGNOSIS — L821 Other seborrheic keratosis: Secondary | ICD-10-CM | POA: Diagnosis not present

## 2023-09-19 DIAGNOSIS — L905 Scar conditions and fibrosis of skin: Secondary | ICD-10-CM | POA: Diagnosis not present

## 2023-09-19 DIAGNOSIS — Z85828 Personal history of other malignant neoplasm of skin: Secondary | ICD-10-CM | POA: Diagnosis not present

## 2023-09-19 DIAGNOSIS — L570 Actinic keratosis: Secondary | ICD-10-CM | POA: Diagnosis not present

## 2023-09-19 DIAGNOSIS — D485 Neoplasm of uncertain behavior of skin: Secondary | ICD-10-CM | POA: Diagnosis not present

## 2023-09-20 ENCOUNTER — Ambulatory Visit: Payer: Medicare PPO

## 2023-09-20 VITALS — Ht 71.0 in | Wt 146.0 lb

## 2023-09-20 DIAGNOSIS — Z0000 Encounter for general adult medical examination without abnormal findings: Secondary | ICD-10-CM

## 2023-09-20 NOTE — Progress Notes (Signed)
 Subjective:   Nathan [name] is a 78 y.o. male who presents for Medicare Annual/Subsequent preventive examination.  Visit Complete: Virtual I connected with  Nathan [name] on 09/20/23 by a audio enabled telemedicine application and verified that I am speaking with the correct person using two identifiers.  Patient Location: Home  Provider Location: Home Office  I discussed the limitations of evaluation and management by telemedicine. The patient expressed understanding and agreed to proceed.  Vital Signs: Because this visit was a virtual/telehealth visit, some criteria may be missing or patient reported. Any vitals not documented were not able to be obtained and vitals that have been documented are patient reported.    Cardiac Risk Factors include: advanced age (>80men, >65 women);male gender     Objective:    Today's Vitals   09/20/23 1314  Weight: 146 lb (66.2 kg)  Height: 5\' 11"  (1.803 m)   Body mass index is 20.36 kg/m.     09/20/2023    1:20 PM 01/21/2023    8:07 AM 01/14/2023   10:23 AM 09/10/2022    9:47 AM 11/16/2021    3:25 PM 09/07/2021    9:55 AM 02/10/2021   10:23 AM  Advanced Directives  Does Patient Have a Medical Advance Directive? Yes Yes Yes Yes Yes Yes Yes  Type of Estate agent of [place];Living will Healthcare Power of [place];Living will;Out of facility DNR (pink MOST or yellow form) Healthcare Power of [place];Living will;Out of facility DNR (pink MOST or yellow form) Healthcare Power of [place];Living will Living will;Healthcare Power of [employer] Power of [place];Living will Healthcare Power of [place];Out of facility DNR (pink MOST or yellow form);Living will  Does patient want to make changes to medical advance directive?  No - Patient declined   No - Patient declined No - Patient declined   Copy of Healthcare Power of Attorney in Chart? No - copy requested   No - copy requested  No - copy requested     Current  Medications (verified) Outpatient Encounter Medications as of 09/20/2023  Medication Sig   cholecalciferol (VITAMIN D) 1000 UNITS tablet Take 1,000 Units by mouth daily.   Cod Liver Oil OIL Take 5 mLs by mouth daily.   Coenzyme Q10 (CO Q 10) 60 MG CAPS Take 1 tablet by mouth daily.   cyanocobalamin 500 MCG tablet Take 1,000 mcg by mouth daily.   folic acid (FOLVITE) 1 MG tablet Take 1 mg by mouth daily.   OVER THE COUNTER MEDICATION AlgeaCal   OVER THE COUNTER MEDICATION Strontium citrate   PHENObarbital (LUMINAL) 60 MG tablet Take 1 tablet (60 mg total) by mouth at bedtime.   Probiotic Product (PROBIOTIC DAILY PO) Take by mouth daily.   vitamin C (ASCORBIC ACID) 500 MG tablet Take 500 mg by mouth 3 (three) times daily.   zinc gluconate 50 MG tablet Take 50 mg by mouth daily.   No facility-administered encounter medications on file as of 09/20/2023.    Allergies (verified) Suprep [na sulfate-k sulfate-mg sulf], Carbamazepine, Ciprofloxacin, Clindamycin/lincomycin, Metronidazole, Penicillins, Phenytoin, Sulfamethoxazole, Trimethoprim, Bactrim, and Phenytoin sodium extended   History: Past Medical History:  Diagnosis Date   Colon polyps    Sessile Serrated adenomas   DYSPNEA 02/09/2009   RESTLESS LEGS SYNDROME 02/28/2009   SEIZURE DISORDER 02/09/2009   last seisure Sep 10, 2001   Past Surgical History:  Procedure Laterality Date   COLONOSCOPY     HERNIA REPAIR Right 02/11/09   dr. Mignon [facility]  HERNIA REPAIR Left    LAPAROSCOPIC APPENDECTOMY  06/01/2011   Procedure: APPENDECTOMY LAPAROSCOPIC;  Surgeon: Kandis Cocking, MD;  Location: [facility];  Service: General;  Laterality: N/A;   POLYPECTOMY     Family History  Problem Relation Age of Onset   Cancer Mother        unknown type but thinks "male organs"   Cancer Father        multiple myeloma   Diabetes Mellitus II Father    Stroke Neg Hx        grandparent   Colon cancer Neg Hx    Esophageal cancer Neg Hx    Rectal cancer  Neg Hx    Stomach cancer Neg Hx    Pancreatic cancer Neg Hx    Liver disease Neg Hx    Social History   Socioeconomic History   Marital status: Married    Spouse name: Liborio Nixon   Number of children: 0   Years of education: 2 MA's    Highest education level: Not on file  Occupational History   Occupation: Retired Magazine features editor: OTHER    Comment: Part Time: [employer]  Tobacco Use   Smoking status: Never   Smokeless tobacco: Never  Vaping Use   Vaping status: Never Used  Substance and Sexual Activity   Alcohol use: No    Alcohol/week: 0.0 standard drinks of alcohol   Drug use: No   Sexual activity: Not on file  Other Topics Concern   Not on file  Social History Narrative   Patient lives at home with spouse.   Caffeine Use: quit 1980   Lives in a one story home. No chidren.   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)   Right Handed   Social Drivers of Health   Financial Resource Strain: Low Risk  (09/20/2023)   Overall Financial Resource Strain (CARDIA)    Difficulty of Paying Living Expenses: Not hard at all  Food Insecurity: No Food Insecurity (09/20/2023)   Hunger Vital Sign    Worried About Running Out of Food in the Last Year: Never true    Ran Out of Food in the Last Year: Never true  Transportation Needs: No Transportation Needs (09/20/2023)   PRAPARE - Administrator, Civil Service (Medical): No    Lack of Transportation (Non-Medical): No  Physical Activity: Insufficiently Active (09/20/2023)   Exercise Vital Sign    Days of Exercise per Week: 3 days    Minutes of Exercise per Session: 30 min  Stress: No Stress Concern Present (09/20/2023)   [employer] of Occupational Health - Occupational Stress Questionnaire    Feeling of Stress : Not at all  Social Connections: Socially Integrated (09/20/2023)   Social Connection and Isolation Panel [NHANES]    Frequency of Communication with Friends and Family:  More than three times a week    Frequency of Social Gatherings with Friends and Family: More than three times a week    Attends Religious Services: More than 4 times per year    Active Member of [employer] or Organizations: Yes    Attends Engineer, structural: More than 4 times per year    Marital Status: Married    Tobacco Counseling Counseling given: Not Answered   Clinical Intake:  Pre-visit preparation completed: Yes  Pain : No/denies pain     BMI - recorded: 20.36 Nutritional Status: BMI of 19-24  Normal Nutritional Risks:  None Diabetes: No  How often do you need to have someone help you when you read instructions, pamphlets, or other written materials from your doctor or pharmacy?: 1 - Never  Interpreter Needed?: No  Information entered by :: Theresa Mulligan LPN   Activities of Daily Living    09/20/2023    1:19 PM  In your present state of health, do you have any difficulty performing the following activities:  Hearing? 1  Comment Wears Hearing Aids  Vision? 0  Difficulty concentrating or making decisions? 0  Walking or climbing stairs? 0  Dressing or bathing? 0  Doing errands, shopping? 0  Preparing Food and eating ? N  Using the Toilet? N  In the past six months, have you accidently leaked urine? N  Do you have problems with loss of bowel control? N  Managing your Medications? N  Managing your Finances? N  Housekeeping or managing your Housekeeping? N    Patient Care Team: Kristian Covey, MD as PCP - General [place] Chard, DO as Consulting Physician (Neurology)  Indicate any recent Medical Services you may have received from other than [facility] providers in the past year (date may be approximate).     Assessment:   This is a routine wellness examination for Ch[place]ls.  Hearing/Vision screen Hearing Screening - Comments:: Wears Hearing Aids Vision Screening - Comments:: Wears rx glasses - up to date with routine eye exams with  Dr Dione Booze    Goals Addressed               This Visit's Progress     Remain Active (pt-stated)         Depression Screen    09/20/2023    1:18 PM 09/10/2022    9:43 AM 04/27/2022    2:09 PM 09/07/2021    9:51 AM 02/03/2020    2:54 PM 11/03/2018    9:40 AM 11/25/2017   10:09 AM  PHQ 2/9 Scores  PHQ - 2 Score 0 0 0 0 0 0 0  PHQ- 9 Score     0      Fall Risk    09/20/2023    1:20 PM 01/14/2023   10:23 AM 09/10/2022    9:45 AM 06/20/2022    7:04 AM 12/05/2021    2:43 PM  Fall Risk   Falls in the past year? 0 0 1 1 1   Number falls in past yr: 0 0 0 0 1  Injury with Fall? 0 0 1 1 1   Comment   Fx Fingers on left hand. Followed by medical attention    Risk for fall due to : No Fall Risks  No Fall Risks;Other (Comment) No Fall Risks No Fall Risks  Risk for fall due to: Comment   Lost ballance    Follow up Falls prevention discussed;Falls evaluation completed Falls evaluation completed Falls prevention discussed Falls evaluation completed Falls evaluation completed    MEDICARE RISK AT HOME: Medicare Risk at Home Any stairs in or around the home?: No If so, are there any without handrails?: No Home free of loose throw rugs in walkways, pet beds, electrical cords, etc?: Yes Adequate lighting in your home to reduce risk of falls?: Yes Life alert?: No Use of a cane, walker or w/c?: No Grab bars in the bathroom?: Yes Shower chair or bench in shower?: Yes Elevated toilet seat or a handicapped toilet?: Yes  TIMED UP AND GO:  Was the test performed?  No    Cognitive  Function:    11/23/2016    9:44 AM  MMSE - Mini Mental State Exam  Not completed: --        09/20/2023    1:20 PM 09/10/2022    9:47 AM 09/07/2021    9:55 AM 02/03/2020    2:56 PM  6CIT Screen  What Year? 0 points 0 points 0 points 0 points  What month? 0 points 0 points 0 points 0 points  What time? 0 points 0 points 0 points 0 points  Count back from 20 0 points 0 points 0 points 0 points  Months in reverse 0 points 0 points  0 points 0 points  Repeat phrase 0 points 0 points 0 points 0 points  Total Score 0 points 0 points 0 points 0 points    Immunizations Immunization History  Administered Date(s) Administered   Fluad Quad(high Dose 65+) 04/20/2019, 04/20/2022   Fluad Trivalent(High Dose 65+) 04/19/2023   Influenza Split 04/26/2011, 04/30/2012   Influenza Whole 05/05/2010   Influenza, High Dose Seasonal PF 05/12/2015, 04/13/2016, 05/02/2017, 04/29/2018   Influenza,inj,Quad PF,6+ Mos 05/07/2013, 05/05/2014   Influenza-Unspecified 04/29/2020, 04/07/2021   MODERNA COVID-19 SARS-COV-2 PEDS BIVALENT BOOSTER 16yr-11yr 05/12/2021   Moderna Sars-Covid-2 Vaccination 08/20/2019, 09/17/2019   Pneumococcal Conjugate-13 01/18/2015   Pneumococcal Polysaccharide-23 01/22/2013   Td 01/28/2007   Td (Adult), 2 Lf Tetanus Toxid, Preservative Free 01/28/2007   Tdap 01/07/2017   Zoster, Live 02/05/2011    TDAP status: Up to date  Flu Vaccine status: Up to date  Pneumococcal vaccine status: Up to date  Covid-19 vaccine status: Declined, Education has been provided regarding the importance of this vaccine but patient still declined. Advised may receive this vaccine at local pharmacy or Health Dept.or vaccine clinic. Aware to provide a copy of the vaccination record if obtained from local pharmacy or Health Dept. Verbalized acceptance and understanding.  Qualifies for Shingles Vaccine? Yes   Zostavax completed No   Shingrix Completed?: No.    Education has been provided regarding the importance of this vaccine. Patient has been advised to call insurance company to determine out of pocket expense if they have not yet received this vaccine. Advised may also receive vaccine at local pharmacy or Health Dept. Verbalized acceptance and understanding.  Screening Tests Health Maintenance  Topic Date Due   Zoster Vaccines- Shingrix (1 of 2) 02/07/1966   COVID-19 Vaccine (4 - 2024-25 season) 03/17/2023   Medicare Annual  Wellness (AWV)  09/19/2024   Colonoscopy  02/04/2025   DTaP/Tdap/Td (4 - Td or Tdap) 01/08/2027   Pneumonia Vaccine 87+ Years old  Completed   INFLUENZA VACCINE  Completed   Hepatitis C Screening  Completed   HPV VACCINES  Aged Out    Health Maintenance  Health Maintenance Due  Topic Date Due   Zoster Vaccines- Shingrix (1 of 2) 02/07/1966   COVID-19 Vaccine (4 - 2024-25 season) 03/17/2023    Colorectal cancer screening: Type of screening: Colonoscopy. Completed 02/05/20. Repeat every 5 years    Additional Screening:  Hepatitis C Screening: does qualify; Completed 11/23/16  Vision Screening: Recommended annual ophthalmology exams for early detection of glaucoma and other disorders of the eye. Is the patient up to date with their annual eye exam?  Yes  Who is the provider or what is the name of the office in which the patient attends annual eye exams? Dr Dione Booze If pt is not established with a provider, would they like to be referred to a provider to establish care?  No .   Dental Screening: Recommended annual dental exams for proper oral hygiene    Community Resource Referral / Chronic Care Management:  CRR required this visit?  No   CCM required this visit?  No     Plan:     I have personally reviewed and noted the following in the patient's chart:   Medical and social history Use of alcohol, tobacco or illicit drugs  Current medications and supplements including opioid prescriptions. Patient is not currently taking opioid prescriptions. Functional ability and status Nutritional status Physical activity Advanced directives List of other physicians Hospitalizations, surgeries, and ER visits in previous 12 months Vitals Screenings to include cognitive, depression, and falls Referrals and appointments  In addition, I have reviewed and discussed with patient certain preventive protocols, quality metrics, and best practice recommendations. A written personalized  care plan for preventive services as well as general preventive health recommendations were provided to patient.     Tillie Rung, LPN   07/21/1094   After Visit Summary: (MyChart) Due to this being a telephonic visit, the after visit summary with patients personalized plan was offered to patient via MyChart   Nurse Notes: None

## 2023-09-20 NOTE — Patient Instructions (Addendum)
 Mr. Bielby , Thank you for taking time to come for your Medicare Wellness Visit. I appreciate your ongoing commitment to your health goals. Please review the following plan we discussed and let me know if I can assist you in the future.   Referrals/Orders/Follow-Ups/Clinician Recommendations:   This is a list of the screening recommended for you and due dates:  Health Maintenance  Topic Date Due   Zoster (Shingles) Vaccine (1 of 2) 02/07/1966   COVID-19 Vaccine (4 - 2024-25 season) 03/17/2023   Medicare Annual Wellness Visit  09/19/2024   Colon Cancer Screening  02/04/2025   DTaP/Tdap/Td vaccine (4 - Td or Tdap) 01/08/2027   Pneumonia Vaccine  Completed   Flu Shot  Completed   Hepatitis C Screening  Completed   HPV Vaccine  Aged Out    Advanced directives: (Copy Requested) Please bring a copy of your health care power of attorney and living will to the office to be added to your chart at your convenience. You can mail to [facility] [address] [place], [place] [address] or email to [email or web address]  Next Medicare Annual Wellness Visit scheduled for next year: Yes

## 2023-10-07 ENCOUNTER — Encounter: Payer: Self-pay | Admitting: Family Medicine

## 2023-10-09 MED ORDER — PHENOBARBITAL 60 MG PO TABS: 60.0000 mg | ORAL_TABLET | Freq: Every day | ORAL | 3 refills | Status: DC

## 2023-10-09 NOTE — Addendum Note (Signed)
 Addended by: Kristian Covey on: 10/09/2023 03:11 PM   Modules accepted: Orders

## 2023-10-09 NOTE — Addendum Note (Signed)
 Addended by: Christy Sartorius on: 10/09/2023 08:17 AM   Modules accepted: Orders

## 2023-10-09 NOTE — Telephone Encounter (Signed)
Refills sent.  [name] MD [facility]

## 2023-10-09 NOTE — Telephone Encounter (Signed)
 I initially sent this to [facility] as that was the default pharmacy that came up.   I have now sent to [facility] pharmacy.  Will need to let [facility] know not to fill. Thanks  B

## 2023-10-24 DIAGNOSIS — M79645 Pain in left finger(s): Secondary | ICD-10-CM | POA: Diagnosis not present

## 2023-10-24 DIAGNOSIS — M79641 Pain in right hand: Secondary | ICD-10-CM | POA: Diagnosis not present

## 2023-11-12 ENCOUNTER — Ambulatory Visit: Payer: Medicare PPO | Admitting: Podiatry

## 2023-11-12 ENCOUNTER — Encounter: Payer: Self-pay | Admitting: Podiatry

## 2023-11-12 DIAGNOSIS — M79676 Pain in unspecified toe(s): Secondary | ICD-10-CM

## 2023-11-12 DIAGNOSIS — D2372 Other benign neoplasm of skin of left lower limb, including hip: Secondary | ICD-10-CM | POA: Diagnosis not present

## 2023-11-12 DIAGNOSIS — B351 Tinea unguium: Secondary | ICD-10-CM | POA: Diagnosis not present

## 2023-11-12 DIAGNOSIS — D2371 Other benign neoplasm of skin of right lower limb, including hip: Secondary | ICD-10-CM

## 2023-11-12 NOTE — Progress Notes (Signed)
 He presents today chief complaint of painful mycotic nails bilaterally.  He is also complaining painful lesions to the forefoot bilateral.  Objective: Vitals are stable oriented x 3 there is no erythema edema cellulitis drainage or odor nails are long thick yellow dystrophic like mycotic pulses remain strong and palpable benign skin lesions beneath the tips of the toes as well as beneath the second metatarsophalangeal joints bilaterally.  Assessment: Pain limb secondary to onychomycosis and benign skin lesions.  Plan: Debridement of benign skin lesions debridement of mycotic nails 1 through 5 bilateral, cervicectomy pain follow-up with him on a 70-month basis.

## 2023-12-03 ENCOUNTER — Ambulatory Visit: Payer: Self-pay

## 2023-12-03 NOTE — Telephone Encounter (Signed)
 Copied from CRM [id number]. Topic: Clinical - Red Word Triage >> Dec 03, 2023  3:29 PM Baldo Levan wrote: Red Word that prompted transfer to Nurse Triage: Bleeding from the rectum.  Chief Complaint: rectal bleeding, bright red on tp only Symptoms: abd pain, rectal bleeding Frequency: once today Pertinent Negatives: Patient denies fever, cp, dizziness, sob Disposition: [] ED /[] Urgent Care (no appt availability in office) / [x] Appointment(In office/virtual)/ []  [facility]/ [] Home Care/ [] Refused Recommended Disposition /[] [place] [facility]/ []  Follow-up with PCP Additional Notes: apt made per protocol; care advice given, denies questions; instructed to go to ER if becomes worse.   Reason for Disposition  MILD rectal bleeding (more than just a few drops or streaks)  Answer Assessment - Initial Assessment Questions 1. APPEARANCE of BLOOD: "What color is it?" "Is it passed separately, on the surface of the stool, or mixed in with the stool?"      Bright red 2. AMOUNT: "How much blood was passed?"      Bleeding this am with stomach pain 3. FREQUENCY: "How many times has blood been passed with the stools?"      once 4. ONSET: "When was the blood first seen in the stools?" (Days or weeks)      This am 5. DIARRHEA: "Is there also some diarrhea?" If Yes, ask: "How many diarrhea stools in the past 24 hours?"      Hx hemorrhoids; denies diarrhea 6. CONSTIPATION: "Do you have constipation?" If Yes, ask: "How bad is it?"     denies 7. RECURRENT SYMPTOMS: "Have you had blood in your stools before?" If Yes, ask: "When was the last time?" and "What happened that time?"      yes 8. BLOOD THINNERS: "Do you take any blood thinners?" (e.g., Coumadin/warfarin, Pradaxa/dabigatran, aspirin)     denies 9. OTHER SYMPTOMS: "Do you have any other symptoms?"  (e.g., abdomen pain, vomiting, dizziness, fever)     Abd pain,  10. PREGNANCY: "Is there any chance you are pregnant?" "When was your last  menstrual period?"       na  Protocols used: Rectal Bleeding-A-AH

## 2023-12-04 ENCOUNTER — Ambulatory Visit: Admitting: Family Medicine

## 2023-12-04 ENCOUNTER — Encounter: Payer: Self-pay | Admitting: Family Medicine

## 2023-12-04 VITALS — BP 130/62 | HR 56 | Temp 97.7°F | Wt 145.5 lb

## 2023-12-04 DIAGNOSIS — K602 Anal fissure, unspecified: Secondary | ICD-10-CM | POA: Diagnosis not present

## 2023-12-04 DIAGNOSIS — K921 Melena: Secondary | ICD-10-CM

## 2023-12-04 DIAGNOSIS — L299 Pruritus, unspecified: Secondary | ICD-10-CM

## 2023-12-04 MED ORDER — HYOSCYAMINE SULFATE 0.125 MG PO TABS: 0.1250 mg | ORAL_TABLET | ORAL | 0 refills | Status: AC | PRN

## 2023-12-04 MED ORDER — MOMETASONE FUROATE 0.1 % EX SOLN: Freq: Every day | CUTANEOUS | 1 refills | Status: AC

## 2023-12-04 NOTE — Patient Instructions (Signed)
 You have a very small superficial split in the skin around the 5 o'clock position of the anus.  Suspect this is the source of bleeding.  Stool Hemoccult was negative  Continue to get at least 30 g of fiber per day from good fiber sources such as fruits, vegetables, whole grains  Continue to drink plenty of fluids-generally around 2 L/day  Consider at least short-term use of stool softener such as Colace 100 mg 1-2 daily-to help reduce straining  Follow-up for any persistent bleeding (eg repeated episodes for next several weeks)

## 2023-12-04 NOTE — Progress Notes (Signed)
 Established Patient Office Visit  Subjective   Patient ID: Nathan [name], male    DOB: Nov 05, 1945  Age: 78 y.o. MRN: [id number]  Chief Complaint  Patient presents with   Abdominal Pain   Rectal Bleeding    HPI   Nathan [name] is seen with episode yesterday of some bright red blood with wiping.  He states he has large caliber stools frequently and sometimes has very hard stools.  Yesterday, after breakfast he had a fairly large soft bowel movement.  He noticed when he went to wipe he had small amount of bright red blood.  He actually had a few soft stools yesterday which is unusual for him.  No watery diarrhea.  This was a small amount of blood with wiping and none noted in the stool.  He states he does have a history of hemorrhoids.  His last colonoscopy was 2021.  No polyps.  Mild left-sided diverticulosis.  He has had a little bit of lower abdominal cramp-like pains.  He states that most of his life he has noted after heavy bowel movements he has had sensation of abdominal spasm.  Had been prescribed hyoscyamine  per GI in the past but never took this.  He is wondering if he needs a prescription.  Denies any recent dizziness.  No significant appetite or weight changes.  Generally very health-conscious.  Drinks a fair amount of water and eats a lot of fiber.  Denies any recent fevers or chills.  No dysuria.  Also relates pruritic ear condition.  He does wear hearing aids and wonders if pruritis may have been there related to to the hearing aids. He has pruritus deep in the ear canal.  No drainage.  Past Medical History:  Diagnosis Date   Colon polyps    Sessile Serrated adenomas   DYSPNEA 02/09/2009   RESTLESS LEGS SYNDROME 02/28/2009   SEIZURE DISORDER 02/09/2009   last seisure Sep 10, 2001   Past Surgical History:  Procedure Laterality Date   COLONOSCOPY     HERNIA REPAIR Right 02/11/09   dr. Margaree Shark -[facility]   HERNIA REPAIR Left    LAPAROSCOPIC APPENDECTOMY  06/01/2011   Procedure:  APPENDECTOMY LAPAROSCOPIC;  Surgeon: Thayne Fine, MD;  Location: [facility];  Service: General;  Laterality: N/A;   POLYPECTOMY      reports that he has never smoked. He has never used smokeless tobacco. He reports that he does not drink alcohol and does not use drugs. family history includes Cancer in his father and mother; Diabetes Mellitus II in his father. Allergies  Allergen Reactions   Suprep [Na Sulfate-K Sulfate-Mg Sulf] Nausea And Vomiting    Hyponatremia leading to seizure   Carbamazepine     REACTION: fatigue, sleepy   Ciprofloxacin      Per pt, "thinks caused rash"   Clindamycin/Lincomycin Diarrhea   Metronidazole      REACTION: GI upset   Penicillins     REACTION: childhood, hives?   Phenytoin     REACTION: rash, leg swelling   Sulfamethoxazole  Other (See Comments)   Trimethoprim  Other (See Comments)   Bactrim  Other (See Comments)    Caused headaches, that progressively worsened when on medication. Once off, they went away.   Phenytoin Sodium Extended Swelling and Rash    Swelling of ankles. Happened years ago per patient.    Review of Systems  Constitutional:  Negative for chills, fever and weight loss.  Cardiovascular:  Negative for chest pain.  Gastrointestinal:  Positive for abdominal  pain and blood in stool. Negative for diarrhea, melena, nausea and vomiting.  Genitourinary:  Negative for dysuria.      Objective:     BP 130/62 (BP Location: Left Arm, Patient Position: Sitting, Cuff Size: Normal)   Pulse (!) 56   Temp 97.7 F (36.5 C) (Oral)   Wt 145 lb 8 oz (66 kg)   SpO2 95%   BMI 20.29 kg/m  BP Readings from Last 3 Encounters:  12/04/23 130/62  01/14/23 127/62  01/09/23 106/60   Wt Readings from Last 3 Encounters:  12/04/23 145 lb 8 oz (66 kg)  09/20/23 146 lb (66.2 kg)  01/14/23 146 lb (66.2 kg)      Physical Exam Vitals reviewed.  Constitutional:      General: He is not in acute distress.    Appearance: He is not ill-appearing.  HENT:      Head:     Comments: Ear canals appear relatively normal.  He has a small amount of cerumen in both canals but not obstructing. Genitourinary:    Comments: Anal exam reveals small external skin tag but no active bleeding.  No thrombosed hemorrhoid.  He has very small split in the skin around the 5 o'clock position but no active bleeding.  Digital exam reveals no rectal mass.  He has a bit of firm stool in the rectal vault but no impaction.  Hemoccult negative. Neurological:     Mental Status: He is alert.      No results found for any visits on 12/04/23.    The ASCVD Risk score (Arnett DK, et al., 2019) failed to calculate for the following reasons:   The valid HDL cholesterol range is 20 to 100 mg/dL    Assessment & Plan:   #1 hematochezia.  He noticed yesterday with wiping.  This is bright blood.  Very small split mucosal around 5 o'clock position but no active bleeding.  Probably related to large volume hard stool recently.  He had colonoscopy 2021 with only mild diverticulosis and no polyps  -Recommend plenty of fluids and continued fiber at least 30 g/day - Consider early short-term use of stool softener such as Colace twice daily if needed - Sitz baths - Be in touch if bleeding persists or worsens  #2 history of presumed colon spasms.  He has been prescribed hyoscyamine  in the past per GI and requesting prescription.  These occur after large bowel movements occasionally.  We sent in new prescription  #3 ear canal pruritus.  No evidence for active eczema or dermatitis at this time.  Sent in prescription for Elocon lotion 0.1% to use 2 to 3 drops in each ear canal once daily as needed   Glean Lamy, MD

## 2023-12-12 DIAGNOSIS — G5601 Carpal tunnel syndrome, right upper limb: Secondary | ICD-10-CM | POA: Diagnosis not present

## 2023-12-12 DIAGNOSIS — G5621 Lesion of ulnar nerve, right upper limb: Secondary | ICD-10-CM | POA: Diagnosis not present

## 2023-12-25 ENCOUNTER — Ambulatory Visit: Admitting: Family Medicine

## 2023-12-25 ENCOUNTER — Encounter: Payer: Self-pay | Admitting: Family Medicine

## 2023-12-25 VITALS — BP 130/70 | HR 62 | Temp 97.8°F | Wt 143.3 lb

## 2023-12-25 DIAGNOSIS — G2581 Restless legs syndrome: Secondary | ICD-10-CM

## 2023-12-25 MED ORDER — GABAPENTIN 100 MG PO CAPS: ORAL_CAPSULE | ORAL | 3 refills | Status: DC

## 2023-12-25 NOTE — Progress Notes (Signed)
 Established Patient Office Visit  Subjective   Patient ID: Nathan [name], male    DOB: 1945-11-19  Age: 78 y.o. MRN: [id number]  Chief Complaint  Patient presents with   Restless legs    HPI   [name] is seen today to discuss possible referral for  restless leg syndrome.  He is currently reading a book written by neurologist at [facility] on restless legs.  He called to get appointment but cannot get appointment till September 21, 2024.  He would like to see if we can make a referral to see if he can get him in quicker.  He has almost nightly symptoms.  No crawling sensation but he has difficulty with sensation of movement and walking and moving helps.  Very disruptive to sleep.  Last night got very little sleep secondary to this.  No caffeine use.  No alcohol use.  Previously took Mirapex  which seemed to help initially but then seemed to quit working.  He also had concerns regarding possible augmentation with this medication.  He specifically had interest regarding gabapentin .  He saw his neurologist couple years ago and they prescribed gabapentin  300 mg at night initially with plans to titrate to 600 mg.  He had significant concerns about side effects at this dosage and did not take this.    He does not have any history of known iron deficiency recently but no recent iron studies.  Past Medical History:  Diagnosis Date   Colon polyps    Sessile Serrated adenomas   DYSPNEA 02/09/2009   RESTLESS LEGS SYNDROME 02/28/2009   SEIZURE DISORDER 02/09/2009   last seisure Sep 10, 2001   Past Surgical History:  Procedure Laterality Date   COLONOSCOPY     HERNIA REPAIR Right 02/11/09   dr. Margaree Shark -[facility]   HERNIA REPAIR Left    LAPAROSCOPIC APPENDECTOMY  06/01/2011   Procedure: APPENDECTOMY LAPAROSCOPIC;  Surgeon: Thayne Fine, MD;  Location: [facility];  Service: General;  Laterality: N/A;   POLYPECTOMY      reports that he has never smoked. He has never used smokeless tobacco. He reports that he  does not drink alcohol and does not use drugs. family history includes Cancer in his father and mother; Diabetes Mellitus II in his father. Allergies  Allergen Reactions   Suprep [Na Sulfate-K Sulfate-Mg Sulf] Nausea And Vomiting    Hyponatremia leading to seizure   Carbamazepine     REACTION: fatigue, sleepy   Ciprofloxacin      Per pt, thinks caused rash   Clindamycin/Lincomycin Diarrhea   Metronidazole      REACTION: GI upset   Penicillins     REACTION: childhood, hives?   Phenytoin     REACTION: rash, leg swelling   Sulfamethoxazole  Other (See Comments)   Trimethoprim  Other (See Comments)   Bactrim  Other (See Comments)    Caused headaches, that progressively worsened when on medication. Once off, they went away.   Phenytoin Sodium Extended Swelling and Rash    Swelling of ankles. Happened years ago per patient.    Review of Systems  Constitutional:  Negative for malaise/fatigue.  Eyes:  Negative for blurred vision.  Respiratory:  Negative for shortness of breath.   Cardiovascular:  Negative for chest pain.  Neurological:  Negative for dizziness, weakness and headaches.      Objective:     BP 130/70 (BP Location: Left Arm, Patient Position: Sitting, Cuff Size: Normal)   Pulse 62   Temp 97.8 F (36.6 C) (Oral)  Wt 143 lb 4.8 oz (65 kg)   SpO2 98%   BMI 19.99 kg/m  BP Readings from Last 3 Encounters:  12/25/23 130/70  12/04/23 130/62  01/14/23 127/62   Wt Readings from Last 3 Encounters:  12/25/23 143 lb 4.8 oz (65 kg)  12/04/23 145 lb 8 oz (66 kg)  09/20/23 146 lb (66.2 kg)      Physical Exam Vitals reviewed.  Constitutional:      General: He is not in acute distress.    Appearance: He is not ill-appearing.  Musculoskeletal:     Right lower leg: No edema.     Left lower leg: No edema.  Lymphadenopathy:     Cervical: No cervical adenopathy.  Neurological:     Mental Status: He is alert.      No results found for any visits on  12/25/23.    The ASCVD Risk score (Arnett DK, et al., 2019) failed to calculate for the following reasons:   The valid HDL cholesterol range is 20 to 100 mg/dL    Assessment & Plan:   Problem List Items Addressed This Visit   None Visit Diagnoses       Restless legs    -  Primary   Relevant Orders   Iron, TIBC and Ferritin Panel   CBC with Differential/Platelet     Has longstanding history of restless leg syndrome and his symptoms becoming more bothersome recently.  No caffeine use.  No alcohol use.  We discussed the following  -Check study with CBC, ferritin, TIBC, serum iron - Discussed medication options.  He has concerns regarding dopaminergic medications.  Recommend trial of gabapentin  100 mg nightly if tolerating well after 1 week increase to 200 mg if needed.  We discussed that higher doses are usually associated with improvement with restless leg but he is very concerned about higher doses.  We will continue to titrate if needed as tolerated. - Will place referral to [facility] neurology to Dr Precious Broccoli per patient request.   No follow-ups on file.    Glean Lamy, MD

## 2023-12-26 ENCOUNTER — Ambulatory Visit: Payer: Self-pay | Admitting: Family Medicine

## 2023-12-26 LAB — CBC WITH DIFFERENTIAL/PLATELET
Absolute Lymphocytes: 1258 {cells}/uL (ref 850–3900)
Absolute Monocytes: 554 {cells}/uL (ref 200–950)
Basophils Absolute: 51 {cells}/uL (ref 0–200)
Basophils Relative: 1.5 %
Eosinophils Absolute: 129 {cells}/uL (ref 15–500)
Eosinophils Relative: 3.8 %
HCT: 40.8 % (ref 38.5–50.0)
Hemoglobin: 13.5 g/dL (ref 13.2–17.1)
MCH: 31.8 pg (ref 27.0–33.0)
MCHC: 33.1 g/dL (ref 32.0–36.0)
MCV: 96 fL (ref 80.0–100.0)
MPV: 12 fL (ref 7.5–12.5)
Monocytes Relative: 16.3 %
Neutro Abs: 1408 {cells}/uL — ABNORMAL LOW (ref 1500–7800)
Neutrophils Relative %: 41.4 %
Platelets: 161 10*3/uL (ref 140–400)
RBC: 4.25 10*6/uL (ref 4.20–5.80)
RDW: 12.3 % (ref 11.0–15.0)
Total Lymphocyte: 37 %
WBC: 3.4 10*3/uL — ABNORMAL LOW (ref 3.8–10.8)

## 2023-12-26 LAB — IRON,TIBC AND FERRITIN PANEL
%SAT: 28 % (ref 20–48)
Ferritin: 162 ng/mL (ref 24–380)
Iron: 84 ug/dL (ref 50–180)
TIBC: 297 ug/dL (ref 250–425)

## 2024-01-13 ENCOUNTER — Encounter: Payer: Self-pay | Admitting: Family Medicine

## 2024-01-13 ENCOUNTER — Ambulatory Visit: Payer: Self-pay | Admitting: Family Medicine

## 2024-01-13 ENCOUNTER — Ambulatory Visit (INDEPENDENT_AMBULATORY_CARE_PROVIDER_SITE_OTHER): Admitting: Family Medicine

## 2024-01-13 VITALS — BP 124/60 | HR 63 | Temp 97.4°F | Ht 71.0 in | Wt 144.7 lb

## 2024-01-13 DIAGNOSIS — Z79899 Other long term (current) drug therapy: Secondary | ICD-10-CM

## 2024-01-13 DIAGNOSIS — E559 Vitamin D deficiency, unspecified: Secondary | ICD-10-CM | POA: Diagnosis not present

## 2024-01-13 DIAGNOSIS — M810 Age-related osteoporosis without current pathological fracture: Secondary | ICD-10-CM | POA: Diagnosis not present

## 2024-01-13 DIAGNOSIS — Z125 Encounter for screening for malignant neoplasm of prostate: Secondary | ICD-10-CM

## 2024-01-13 DIAGNOSIS — Z0000 Encounter for general adult medical examination without abnormal findings: Secondary | ICD-10-CM | POA: Diagnosis not present

## 2024-01-13 LAB — HEPATIC FUNCTION PANEL
ALT: 29 U/L (ref 0–53)
AST: 24 U/L (ref 0–37)
Albumin: 4.7 g/dL (ref 3.5–5.2)
Alkaline Phosphatase: 79 U/L (ref 39–117)
Bilirubin, Direct: 0.1 mg/dL (ref 0.0–0.3)
Total Bilirubin: 0.6 mg/dL (ref 0.2–1.2)
Total Protein: 6.4 g/dL (ref 6.0–8.3)

## 2024-01-13 LAB — BASIC METABOLIC PANEL WITH GFR
BUN: 15 mg/dL (ref 6–23)
CO2: 33 meq/L — ABNORMAL HIGH (ref 19–32)
Calcium: 9.6 mg/dL (ref 8.4–10.5)
Chloride: 98 meq/L (ref 96–112)
Creatinine, Ser: 0.66 mg/dL (ref 0.40–1.50)
GFR: 90.84 mL/min (ref >60.00)
Glucose, Bld: 93 mg/dL (ref 70–99)
Potassium: 4.2 meq/L (ref 3.5–5.1)
Sodium: 139 meq/L (ref 135–145)

## 2024-01-13 LAB — VITAMIN D 25 HYDROXY (VIT D DEFICIENCY, FRACTURES): VITD: 39.48 ng/mL (ref 30.00–100.00)

## 2024-01-13 LAB — LIPID PANEL
Cholesterol: 190 mg/dL (ref 0–200)
HDL: 103.4 mg/dL (ref >39.00)
LDL Cholesterol: 80 mg/dL (ref 0–99)
NonHDL: 86.81
Total CHOL/HDL Ratio: 2
Triglycerides: 36 mg/dL (ref 0.0–149.0)
VLDL: 7.2 mg/dL (ref 0.0–40.0)

## 2024-01-13 LAB — PSA, MEDICARE: PSA: 0.88 ng/mL (ref 0.10–4.00)

## 2024-01-13 NOTE — Patient Instructions (Signed)
 Consider Shingrix and RSV vaccines.

## 2024-01-13 NOTE — Progress Notes (Signed)
 Established Patient Office Visit  Subjective   Patient ID: Nathan [name], male    DOB: December 26, 1945  Age: 78 y.o. MRN: [id number]  Chief Complaint  Patient presents with   Annual Exam    HPI   Nathan [name] is seen today for physical exam.  He has history of idiopathic/hereditary neuropathy, osteoporosis, history of seizures, restless leg syndrome.  Recently was started on low-dose gabapentin  for restless legs after previous intolerance with dopamine agonist.  He states he had some lower lip tingling and has held the gabapentin .  No lip or tongue edema.    Very health-conscious.  Exercises regularly.  Tries to eat organic grown fruits and vegetables.  No recent seizures.  Takes algae cal for his osteoporosis.  Declined specific osteoporosis medications.  Health maintenance reviewed:  Health Maintenance  Topic Date Due   Zoster Vaccines- Shingrix (1 of 2) 02/07/1966   COVID-19 Vaccine (4 - 2024-25 season) 03/17/2023   INFLUENZA VACCINE  02/14/2024   Medicare Annual Wellness (AWV)  09/19/2024   Colonoscopy  02/04/2025   DTaP/Tdap/Td (4 - Td or Tdap) 01/08/2027   Pneumococcal Vaccine: 50+ Years  Completed   Hepatitis C Screening  Completed   Hepatitis B Vaccines  Aged Out   HPV VACCINES  Aged Out   Meningococcal B Vaccine  Aged Out   Social history-married for 48 years.  No children.  No history of smoking.  No alcohol.  Retired from [employer].  Exercises regularly.  Family history-mother had some type of male cancer possibly ovarian or uterine.  Father had multiple myeloma in died age 15.  No siblings.  Past Medical History:  Diagnosis Date   Colon polyps    Sessile Serrated adenomas   DYSPNEA 02/09/2009   RESTLESS LEGS SYNDROME 02/28/2009   SEIZURE DISORDER 02/09/2009   last seisure Sep 10, 2001   Past Surgical History:  Procedure Laterality Date   COLONOSCOPY     HERNIA REPAIR Right 02/11/09   dr. lizabeth -[facility]   HERNIA REPAIR Left    LAPAROSCOPIC APPENDECTOMY   06/01/2011   Procedure: APPENDECTOMY LAPAROSCOPIC;  Surgeon: Alm VEAR Angle, MD;  Location: [facility];  Service: General;  Laterality: N/A;   POLYPECTOMY      reports that he has never smoked. He has never used smokeless tobacco. He reports that he does not drink alcohol and does not use drugs. family history includes Cancer in his father and mother; Diabetes Mellitus II in his father. Allergies  Allergen Reactions   Suprep [Na Sulfate-K Sulfate-Mg Sulf] Nausea And Vomiting    Hyponatremia leading to seizure   Carbamazepine     REACTION: fatigue, sleepy   Ciprofloxacin      Per pt, thinks caused rash   Clindamycin/Lincomycin Diarrhea   Metronidazole      REACTION: GI upset   Penicillins     REACTION: childhood, hives?   Phenytoin     REACTION: rash, leg swelling   Sulfamethoxazole  Other (See Comments)   Trimethoprim  Other (See Comments)   Bactrim  Other (See Comments)    Caused headaches, that progressively worsened when on medication. Once off, they went away.   Phenytoin Sodium Extended Swelling and Rash    Swelling of ankles. Happened years ago per patient.     Review of Systems  Constitutional:  Negative for chills, fever, malaise/fatigue and weight loss.  HENT:  Negative for hearing loss.   Eyes:  Negative for blurred vision and double vision.  Respiratory:  Negative for cough and shortness  of breath.   Cardiovascular:  Negative for chest pain, palpitations and leg swelling.  Gastrointestinal:  Negative for abdominal pain, blood in stool, constipation and diarrhea.  Genitourinary:  Negative for dysuria.  Skin:  Negative for rash.  Neurological:  Negative for dizziness, speech change, seizures, loss of consciousness and headaches.  Psychiatric/Behavioral:  Negative for depression.       Objective:     BP 124/60 (BP Location: Left Arm, Patient Position: Sitting, Cuff Size: Normal)   Pulse 63   Temp (!) 97.4 F (36.3 C) (Oral)   Ht 5' 11 (1.803 m)   Wt 144 lb 11.2  oz (65.6 kg)   SpO2 98%   BMI 20.18 kg/m  BP Readings from Last 3 Encounters:  01/13/24 124/60  12/25/23 130/70  12/04/23 130/62   Wt Readings from Last 3 Encounters:  01/13/24 144 lb 11.2 oz (65.6 kg)  12/25/23 143 lb 4.8 oz (65 kg)  12/04/23 145 lb 8 oz (66 kg)      Physical Exam Vitals reviewed.  Constitutional:      General: He is not in acute distress.    Appearance: He is well-developed. He is not ill-appearing.  HENT:     Head: Normocephalic and atraumatic.     Right Ear: External ear normal.     Left Ear: External ear normal.   Eyes:     Conjunctiva/sclera: Conjunctivae normal.     Pupils: Pupils are equal, round, and reactive to light.   Neck:     Thyroid : No thyromegaly.   Cardiovascular:     Rate and Rhythm: Normal rate and regular rhythm.     Heart sounds: Normal heart sounds. No murmur heard. Pulmonary:     Effort: No respiratory distress.     Breath sounds: No wheezing or rales.  Abdominal:     General: Bowel sounds are normal. There is no distension.     Palpations: Abdomen is soft. There is no mass.     Tenderness: There is no abdominal tenderness. There is no guarding or rebound.   Musculoskeletal:     Cervical back: Normal range of motion and neck supple.     Right lower leg: No edema.     Left lower leg: No edema.  Lymphadenopathy:     Cervical: No cervical adenopathy.   Skin:    Findings: No rash.   Neurological:     Mental Status: He is alert and oriented to person, place, and time.     Cranial Nerves: No cranial nerve deficit.      Results for orders placed or performed in visit on 01/13/24  VITAMIN D  25 Hydroxy (Vit-D Deficiency, Fractures)  Result Value Ref Range   VITD 39.48 30.00 - 100.00 ng/mL  PSA, Medicare  Result Value Ref Range   PSA 0.88 0.10 - 4.00 ng/ml  Hepatic function panel  Result Value Ref Range   Total Bilirubin 0.6 0.2 - 1.2 mg/dL   Bilirubin, Direct 0.1 0.0 - 0.3 mg/dL   Alkaline Phosphatase 79 39 - 117  U/L   AST 24 0 - 37 U/L   ALT 29 0 - 53 U/L   Total Protein 6.4 6.0 - 8.3 g/dL   Albumin 4.7 3.5 - 5.2 g/dL  Lipid panel  Result Value Ref Range   Cholesterol 190 0 - 200 mg/dL   Triglycerides 63.9 0.0 - 149.0 mg/dL   HDL 896.59 >60.99 mg/dL   VLDL 7.2 0.0 - 59.9 mg/dL   LDL Cholesterol 80  0 - 99 mg/dL   Total CHOL/HDL Ratio 2    NonHDL 86.81   Basic metabolic panel with GFR  Result Value Ref Range   Sodium 139 135 - 145 mEq/L   Potassium 4.2 3.5 - 5.1 mEq/L   Chloride 98 96 - 112 mEq/L   CO2 33 (H) 19 - 32 mEq/L   Glucose, Bld 93 70 - 99 mg/dL   BUN 15 6 - 23 mg/dL   Creatinine, Ser 9.33 0.40 - 1.50 mg/dL   GFR 09.15 >39.99 mL/min   Calcium  9.6 8.4 - 10.5 mg/dL      The ASCVD Risk score (Arnett DK, et al., 2019) failed to calculate for the following reasons:   The valid HDL cholesterol range is 20 to 100 mg/dL    Assessment & Plan:   Problem List Items Addressed This Visit       Unprioritized   Osteoporosis   Relevant Orders   DG Bone Density   Other Visit Diagnoses       Physical exam    -  Primary   Relevant Orders   Basic metabolic panel with GFR (Completed)   Lipid panel (Completed)     Vitamin D  deficiency       Relevant Orders   VITAMIN D  25 Hydroxy (Vit-D Deficiency, Fractures) (Completed)     Prostate cancer screening       Relevant Orders   PSA, Medicare (Completed)     High risk medication use       Relevant Orders   Hepatic function panel (Completed)   Phenobarbital  level     78 year old male with chronic problems as above.  Here for physical exam today.  We discussed several health maintenance items as follows  -Discussed fall prevention.  He is very cautious with his neuropathy history.  Does exercise regularly.   - Consider RSV and Shingrix vaccines at some point this year - Patient requesting PSA.  We discussed pros and cons of screening at his age. - Check phenobarbital  level. - Patient does have history of osteoporosis and he would  like follow-up vitamin D  level. - Repeat DEXA scan ordered which is 2 years from previous.  He has risk factor of chronic anticonvulsant use - Continue regular weightbearing exercise  No follow-ups on file.    Wolm Scarlet, MD

## 2024-01-14 DIAGNOSIS — H9201 Otalgia, right ear: Secondary | ICD-10-CM | POA: Diagnosis not present

## 2024-01-14 DIAGNOSIS — R0981 Nasal congestion: Secondary | ICD-10-CM | POA: Diagnosis not present

## 2024-01-14 DIAGNOSIS — J309 Allergic rhinitis, unspecified: Secondary | ICD-10-CM | POA: Diagnosis not present

## 2024-01-14 DIAGNOSIS — J301 Allergic rhinitis due to pollen: Secondary | ICD-10-CM | POA: Diagnosis not present

## 2024-01-14 DIAGNOSIS — J300 Vasomotor rhinitis: Secondary | ICD-10-CM | POA: Diagnosis not present

## 2024-01-14 LAB — PHENOBARBITAL LEVEL: Phenobarbital, Serum: 7.7 mg/L — ABNORMAL LOW (ref 15.0–40.0)

## 2024-01-30 ENCOUNTER — Ambulatory Visit: Admitting: Podiatry

## 2024-01-30 ENCOUNTER — Encounter: Payer: Self-pay | Admitting: Podiatry

## 2024-01-30 DIAGNOSIS — D2371 Other benign neoplasm of skin of right lower limb, including hip: Secondary | ICD-10-CM | POA: Diagnosis not present

## 2024-01-30 DIAGNOSIS — B351 Tinea unguium: Secondary | ICD-10-CM

## 2024-01-30 DIAGNOSIS — D2372 Other benign neoplasm of skin of left lower limb, including hip: Secondary | ICD-10-CM

## 2024-01-30 DIAGNOSIS — M79676 Pain in unspecified toe(s): Secondary | ICD-10-CM | POA: Diagnosis not present

## 2024-01-30 NOTE — Progress Notes (Signed)
 He presents today chief complaint of painful mycotic nails bilaterally.  He is also complaining painful lesions to the forefoot bilateral.  Objective: Vitals are stable oriented x 3 there is no erythema edema cellulitis drainage or odor nails are long thick yellow dystrophic like mycotic pulses remain strong and palpable benign skin lesions beneath the tips of the toes as well as beneath the second metatarsophalangeal joints bilaterally.  Assessment: Pain limb secondary to onychomycosis and benign skin lesions.  Plan: Debridement of benign skin lesions debridement of mycotic nails 1 through 5 bilateral, cervicectomy pain follow-up with him on a 70-month basis.

## 2024-03-23 DIAGNOSIS — Z85828 Personal history of other malignant neoplasm of skin: Secondary | ICD-10-CM | POA: Diagnosis not present

## 2024-03-23 DIAGNOSIS — L905 Scar conditions and fibrosis of skin: Secondary | ICD-10-CM | POA: Diagnosis not present

## 2024-03-23 DIAGNOSIS — L821 Other seborrheic keratosis: Secondary | ICD-10-CM | POA: Diagnosis not present

## 2024-03-24 ENCOUNTER — Ambulatory Visit (INDEPENDENT_AMBULATORY_CARE_PROVIDER_SITE_OTHER)
Admission: RE | Admit: 2024-03-24 | Discharge: 2024-03-24 | Disposition: A | Discharge status: Home / Self Care | Source: Ambulatory Visit | Attending: Family Medicine | Admitting: Family Medicine

## 2024-03-24 DIAGNOSIS — M810 Age-related osteoporosis without current pathological fracture: Secondary | ICD-10-CM

## 2024-04-02 ENCOUNTER — Ambulatory Visit: Admitting: Podiatry

## 2024-04-07 ENCOUNTER — Ambulatory Visit: Admitting: Podiatry

## 2024-04-07 DIAGNOSIS — D2371 Other benign neoplasm of skin of right lower limb, including hip: Secondary | ICD-10-CM

## 2024-04-07 DIAGNOSIS — M79676 Pain in unspecified toe(s): Secondary | ICD-10-CM

## 2024-04-07 DIAGNOSIS — B351 Tinea unguium: Secondary | ICD-10-CM | POA: Diagnosis not present

## 2024-04-07 NOTE — Progress Notes (Signed)
 He presents today chief complaint of painful mycotic nails bilaterally.  He is also complaining painful lesions to the forefoot bilateral.  Objective: Vitals are stable oriented x 3 there is no erythema edema cellulitis drainage or odor nails are long thick yellow dystrophic like mycotic pulses remain strong and palpable benign skin lesions beneath the tips of the toes as well as beneath the second metatarsophalangeal joints bilaterally.  Assessment: Pain limb secondary to onychomycosis and benign skin lesions.  Plan: Debridement of benign skin lesions debridement of mycotic nails 1 through 5 bilateral, cervicectomy pain follow-up with him on a 70-month basis.

## 2024-05-06 ENCOUNTER — Ambulatory Visit (INDEPENDENT_AMBULATORY_CARE_PROVIDER_SITE_OTHER)

## 2024-05-06 DIAGNOSIS — Z23 Encounter for immunization: Secondary | ICD-10-CM

## 2024-06-03 DIAGNOSIS — L720 Epidermal cyst: Secondary | ICD-10-CM | POA: Diagnosis not present

## 2024-06-03 DIAGNOSIS — L821 Other seborrheic keratosis: Secondary | ICD-10-CM | POA: Diagnosis not present

## 2024-06-03 DIAGNOSIS — L738 Other specified follicular disorders: Secondary | ICD-10-CM | POA: Diagnosis not present

## 2024-06-09 ENCOUNTER — Ambulatory Visit: Admitting: Podiatry

## 2024-06-09 ENCOUNTER — Encounter: Payer: Self-pay | Admitting: Podiatry

## 2024-06-09 DIAGNOSIS — D2371 Other benign neoplasm of skin of right lower limb, including hip: Secondary | ICD-10-CM

## 2024-06-09 DIAGNOSIS — M79676 Pain in unspecified toe(s): Secondary | ICD-10-CM | POA: Diagnosis not present

## 2024-06-09 DIAGNOSIS — B351 Tinea unguium: Secondary | ICD-10-CM | POA: Diagnosis not present

## 2024-06-09 DIAGNOSIS — D2372 Other benign neoplasm of skin of left lower limb, including hip: Secondary | ICD-10-CM | POA: Diagnosis not present

## 2024-06-09 NOTE — Progress Notes (Signed)
 He presents today chief complaint of painful mycotic nails bilaterally.  He is also complaining painful lesions to the forefoot bilateral.  Objective: Vitals are stable oriented x 3 there is no erythema edema cellulitis drainage or odor nails are long thick yellow dystrophic like mycotic pulses remain strong and palpable benign skin lesions beneath the tips of the toes as well as beneath the second metatarsophalangeal joints bilaterally.  Assessment: Pain limb secondary to onychomycosis and benign skin lesions.  Plan: Debridement of benign skin lesions debridement of mycotic nails 1 through 5 bilateral. Follow-up with him on a 35-month basis.

## 2024-07-03 ENCOUNTER — Other Ambulatory Visit: Payer: Self-pay | Admitting: Family Medicine

## 2024-07-15 ENCOUNTER — Ambulatory Visit: Admitting: Family Medicine

## 2024-08-11 ENCOUNTER — Ambulatory Visit: Admitting: Podiatry

## 2024-08-18 ENCOUNTER — Ambulatory Visit: Admitting: Podiatry

## 2024-08-25 ENCOUNTER — Ambulatory Visit: Admitting: Podiatry

## 2024-09-25 ENCOUNTER — Ambulatory Visit
